# Patient Record
Sex: Female | Born: 1955 | Race: White | Hispanic: No | Marital: Married | Smoking: Former smoker
Health system: Southern US, Community
[De-identification: ages and names within clinical notes are randomized; demographics above are authoritative.]

## PROBLEM LIST (undated history)

## (undated) DIAGNOSIS — R519 Headache, unspecified: Secondary | ICD-10-CM

## (undated) DIAGNOSIS — M1990 Unspecified osteoarthritis, unspecified site: Secondary | ICD-10-CM

## (undated) DIAGNOSIS — I4892 Unspecified atrial flutter: Secondary | ICD-10-CM

## (undated) DIAGNOSIS — J449 Chronic obstructive pulmonary disease, unspecified: Secondary | ICD-10-CM

## (undated) DIAGNOSIS — K219 Gastro-esophageal reflux disease without esophagitis: Secondary | ICD-10-CM

## (undated) DIAGNOSIS — I4891 Unspecified atrial fibrillation: Secondary | ICD-10-CM

## (undated) DIAGNOSIS — F319 Bipolar disorder, unspecified: Secondary | ICD-10-CM

## (undated) HISTORY — PX: BREAST SURGERY: SHX581

## (undated) HISTORY — DX: Bipolar disorder, unspecified: F31.9

## (undated) HISTORY — DX: Unspecified osteoarthritis, unspecified site: M19.90

## (undated) HISTORY — PX: ABDOMINAL HYSTERECTOMY: SHX81

## (undated) HISTORY — DX: Gastro-esophageal reflux disease without esophagitis: K21.9

## (undated) HISTORY — PX: TONSILLECTOMY: SHX5217

## (undated) HISTORY — PX: KNEE ARTHROSCOPY: SUR90

---

## 1998-01-24 ENCOUNTER — Ambulatory Visit (HOSPITAL_COMMUNITY): Admission: RE | Admit: 1998-01-24 | Discharge: 1998-01-24 | Payer: Self-pay | Admitting: Obstetrics and Gynecology

## 1998-02-07 ENCOUNTER — Ambulatory Visit (HOSPITAL_COMMUNITY): Admission: RE | Admit: 1998-02-07 | Discharge: 1998-02-07 | Payer: Self-pay | Admitting: Obstetrics and Gynecology

## 1999-04-07 ENCOUNTER — Encounter: Payer: Self-pay | Admitting: Internal Medicine

## 1999-04-07 ENCOUNTER — Encounter: Admission: RE | Admit: 1999-04-07 | Discharge: 1999-04-07 | Payer: Self-pay | Admitting: Internal Medicine

## 1999-11-06 ENCOUNTER — Other Ambulatory Visit: Admission: RE | Admit: 1999-11-06 | Discharge: 1999-11-06 | Payer: Self-pay | Admitting: Obstetrics and Gynecology

## 2000-06-02 ENCOUNTER — Observation Stay (HOSPITAL_COMMUNITY): Admission: EM | Admit: 2000-06-02 | Discharge: 2000-06-03 | Payer: Self-pay | Admitting: Emergency Medicine

## 2000-06-02 ENCOUNTER — Encounter (INDEPENDENT_AMBULATORY_CARE_PROVIDER_SITE_OTHER): Payer: Self-pay

## 2000-06-02 ENCOUNTER — Encounter: Payer: Self-pay | Admitting: Surgery

## 2000-06-02 ENCOUNTER — Encounter: Payer: Self-pay | Admitting: Emergency Medicine

## 2000-11-12 ENCOUNTER — Encounter: Admission: RE | Admit: 2000-11-12 | Discharge: 2000-11-12 | Payer: Self-pay | Admitting: Internal Medicine

## 2000-11-12 ENCOUNTER — Encounter: Payer: Self-pay | Admitting: Internal Medicine

## 2001-01-24 ENCOUNTER — Other Ambulatory Visit: Admission: RE | Admit: 2001-01-24 | Discharge: 2001-01-24 | Payer: Self-pay | Admitting: Obstetrics and Gynecology

## 2002-02-15 ENCOUNTER — Other Ambulatory Visit: Admission: RE | Admit: 2002-02-15 | Discharge: 2002-02-15 | Payer: Self-pay | Admitting: Obstetrics and Gynecology

## 2003-08-03 ENCOUNTER — Encounter: Admission: RE | Admit: 2003-08-03 | Discharge: 2003-08-03 | Payer: Self-pay | Admitting: *Deleted

## 2003-10-23 ENCOUNTER — Emergency Department (HOSPITAL_COMMUNITY): Admission: EM | Admit: 2003-10-23 | Discharge: 2003-10-23 | Payer: Self-pay | Admitting: *Deleted

## 2004-06-18 ENCOUNTER — Ambulatory Visit: Payer: Self-pay | Admitting: Internal Medicine

## 2004-08-28 ENCOUNTER — Other Ambulatory Visit: Admission: RE | Admit: 2004-08-28 | Discharge: 2004-08-28 | Payer: Self-pay | Admitting: Obstetrics and Gynecology

## 2005-02-13 ENCOUNTER — Ambulatory Visit: Payer: Self-pay | Admitting: Internal Medicine

## 2005-04-22 ENCOUNTER — Ambulatory Visit: Payer: Self-pay | Admitting: Internal Medicine

## 2005-10-15 ENCOUNTER — Ambulatory Visit: Payer: Self-pay | Admitting: Internal Medicine

## 2005-11-06 ENCOUNTER — Ambulatory Visit: Payer: Self-pay | Admitting: Internal Medicine

## 2006-01-12 ENCOUNTER — Ambulatory Visit (HOSPITAL_BASED_OUTPATIENT_CLINIC_OR_DEPARTMENT_OTHER): Admission: RE | Admit: 2006-01-12 | Discharge: 2006-01-12 | Payer: Self-pay | Admitting: Orthopedic Surgery

## 2006-10-29 ENCOUNTER — Ambulatory Visit: Payer: Self-pay | Admitting: Internal Medicine

## 2006-10-29 LAB — CONVERTED CEMR LAB
ALT: 17 units/L (ref 0–40)
AST: 19 units/L (ref 0–37)
Albumin: 3.7 g/dL (ref 3.5–5.2)
Alkaline Phosphatase: 69 units/L (ref 39–117)
BUN: 9 mg/dL (ref 6–23)
Bilirubin, Direct: 0.1 mg/dL (ref 0.0–0.3)
CO2: 35 meq/L — ABNORMAL HIGH (ref 19–32)
Calcium: 9 mg/dL (ref 8.4–10.5)
Chloride: 105 meq/L (ref 96–112)
Cholesterol: 238 mg/dL (ref 0–200)
Creatinine, Ser: 0.7 mg/dL (ref 0.4–1.2)
Direct LDL: 143.7 mg/dL
GFR calc Af Amer: 115 mL/min
GFR calc non Af Amer: 95 mL/min
Glucose, Bld: 76 mg/dL (ref 70–99)
HCT: 43.7 % (ref 36.0–46.0)
Hemoglobin: 14.9 g/dL (ref 12.0–15.0)
Hgb A1c MFr Bld: 5.6 % (ref 4.6–6.0)
MCHC: 34.1 g/dL (ref 30.0–36.0)
MCV: 85.7 fL (ref 78.0–100.0)
Platelets: 237 10*3/uL (ref 150–400)
Potassium: 3.8 meq/L (ref 3.5–5.1)
RBC: 5.1 M/uL (ref 3.87–5.11)
RDW: 13.6 % (ref 11.5–14.6)
Sodium: 143 meq/L (ref 135–145)
TSH: 3.87 microintl units/mL (ref 0.35–5.50)
Total Bilirubin: 0.6 mg/dL (ref 0.3–1.2)
Total Protein: 7 g/dL (ref 6.0–8.3)
Vitamin B-12: 976 pg/mL — ABNORMAL HIGH (ref 211–911)
WBC: 11.3 10*3/uL — ABNORMAL HIGH (ref 4.5–10.5)

## 2006-11-04 ENCOUNTER — Ambulatory Visit: Payer: Self-pay | Admitting: Internal Medicine

## 2006-11-12 ENCOUNTER — Encounter: Payer: Self-pay | Admitting: Internal Medicine

## 2006-11-12 ENCOUNTER — Ambulatory Visit: Payer: Self-pay | Admitting: Internal Medicine

## 2006-11-12 DIAGNOSIS — K219 Gastro-esophageal reflux disease without esophagitis: Secondary | ICD-10-CM

## 2006-11-12 DIAGNOSIS — M1990 Unspecified osteoarthritis, unspecified site: Secondary | ICD-10-CM

## 2006-11-12 HISTORY — DX: Unspecified osteoarthritis, unspecified site: M19.90

## 2006-11-12 HISTORY — DX: Gastro-esophageal reflux disease without esophagitis: K21.9

## 2006-11-12 LAB — CONVERTED CEMR LAB
Anti Nuclear Antibody(ANA): NEGATIVE
Basophils Absolute: 0 10*3/uL (ref 0.0–0.1)
Basophils Relative: 0.1 % (ref 0.0–1.0)
Eosinophils Absolute: 0.2 10*3/uL (ref 0.0–0.6)
Eosinophils Relative: 1.5 % (ref 0.0–5.0)
HCT: 40.6 % (ref 36.0–46.0)
Hemoglobin: 14.1 g/dL (ref 12.0–15.0)
Lymphocytes Relative: 20.3 % (ref 12.0–46.0)
MCHC: 34.7 g/dL (ref 30.0–36.0)
MCV: 86.8 fL (ref 78.0–100.0)
Monocytes Absolute: 0.6 10*3/uL (ref 0.2–0.7)
Monocytes Relative: 5.4 % (ref 3.0–11.0)
Neutro Abs: 7.6 10*3/uL (ref 1.4–7.7)
Neutrophils Relative %: 72.7 % (ref 43.0–77.0)
Platelets: 282 10*3/uL (ref 150–400)
RBC: 4.68 M/uL (ref 3.87–5.11)
RDW: 14 % (ref 11.5–14.6)
Total CK: 101 units/L (ref 7–177)
WBC: 10.6 10*3/uL — ABNORMAL HIGH (ref 4.5–10.5)

## 2006-11-22 ENCOUNTER — Ambulatory Visit: Payer: Self-pay | Admitting: Internal Medicine

## 2007-01-28 ENCOUNTER — Telehealth (INDEPENDENT_AMBULATORY_CARE_PROVIDER_SITE_OTHER): Payer: Self-pay | Admitting: *Deleted

## 2007-02-28 ENCOUNTER — Ambulatory Visit: Payer: Self-pay | Admitting: Internal Medicine

## 2007-02-28 DIAGNOSIS — F319 Bipolar disorder, unspecified: Secondary | ICD-10-CM | POA: Insufficient documentation

## 2007-02-28 HISTORY — DX: Bipolar disorder, unspecified: F31.9

## 2007-03-01 LAB — CONVERTED CEMR LAB: Lithium Lvl: 0.36 meq/L — ABNORMAL LOW (ref 0.80–1.40)

## 2007-12-27 ENCOUNTER — Ambulatory Visit: Payer: Self-pay | Admitting: Internal Medicine

## 2008-01-02 ENCOUNTER — Telehealth: Payer: Self-pay | Admitting: Internal Medicine

## 2008-01-04 ENCOUNTER — Telehealth (INDEPENDENT_AMBULATORY_CARE_PROVIDER_SITE_OTHER): Payer: Self-pay

## 2008-01-17 ENCOUNTER — Telehealth: Payer: Self-pay | Admitting: Family Medicine

## 2009-07-23 ENCOUNTER — Ambulatory Visit: Payer: Self-pay | Admitting: Internal Medicine

## 2009-07-23 DIAGNOSIS — J029 Acute pharyngitis, unspecified: Secondary | ICD-10-CM | POA: Insufficient documentation

## 2009-07-23 LAB — CONVERTED CEMR LAB: Rapid Strep: POSITIVE

## 2009-10-02 ENCOUNTER — Encounter: Payer: Self-pay | Admitting: Internal Medicine

## 2010-04-03 ENCOUNTER — Ambulatory Visit: Payer: Self-pay | Admitting: Internal Medicine

## 2010-04-03 DIAGNOSIS — B353 Tinea pedis: Secondary | ICD-10-CM | POA: Insufficient documentation

## 2010-05-06 ENCOUNTER — Telehealth: Payer: Self-pay | Admitting: Internal Medicine

## 2010-07-08 NOTE — Assessment & Plan Note (Signed)
Summary: SORE THROAT (CONCERNED ABOUT STREP) // RS   Vital Signs:  Patient profile:   55 year old female Weight:      171 pounds Temp:     98.8 degrees F oral BP sitting:   124 / 78  (right arm) Cuff size:   regular  Vitals Entered By: Duard Brady LPN (July 23, 2009 2:47 PM) CC: c/o sore throat ,body aches   CC:  c/o sore throat  and body aches.  History of Present Illness: 55 year old patient has developed onset of sore throat, fever, and myalgias.  She has been followed by psychiatry for her bipolar disorder and her medications are being adjusted.  No documented fever.  No strep exposure.  She is requesting a refill on her medications.  Preventive Screening-Counseling & Management  Alcohol-Tobacco     Smoking Status: current  Allergies (verified): 1)  ! Doxycycline Hyclate (Doxycycline Hyclate)  Past History:  Past Medical History: GERD DJD bipolar depression  Review of Systems       The patient complains of anorexia, fever, muscle weakness, and depression.    Physical Exam  General:  Well-developed,well-nourished,in no acute distress; alert,appropriate and cooperative throughout examination Head:  Normocephalic and atraumatic without obvious abnormalities. No apparent alopecia or balding. Eyes:  No corneal or conjunctival inflammation noted. EOMI. Perrla. Funduscopic exam benign, without hemorrhages, exudates or papilledema. Vision grossly normal. Ears:  External ear exam shows no significant lesions or deformities.  Otoscopic examination reveals clear canals, tympanic membranes are intact bilaterally without bulging, retraction, inflammation or discharge. Hearing is grossly normal bilaterally. Nose:  External nasal examination shows no deformity or inflammation. Nasal mucosa are pink and moist without lesions or exudates. Mouth:  pharyngeal erythema.   Neck:  No deformities, masses, or tenderness noted. Lungs:  Normal respiratory effort, chest expands  symmetrically. Lungs are clear to auscultation, no crackles or wheezes. Heart:  Normal rate and regular rhythm. S1 and S2 normal without gallop, murmur, click, rub or other extra sounds.   Impression & Recommendations:  Problem # 1:  SORE THROAT (ICD-462)  The following medications were removed from the medication list:    Meloxicam 7.5 Mg Tabs (Meloxicam)    Azithromycin 250 Mg Tabs (Azithromycin) .Marland Kitchen... As directed Her updated medication list for this problem includes:    Penicillin V Potassium 500 Mg Tabs (Penicillin v potassium) ..... One tablet 4 times daily  Orders: Rapid Strep ([address])  Problem # 2:  DISORDER, BIPOLAR NOS (ICD-296.80)  Problem # 3:  DEGENERATIVE JOINT DISEASE (ICD-715.90)  The following medications were removed from the medication list:    Vicodin 5-500 Mg Tabs (Hydrocodone-acetaminophen)    Meloxicam 7.5 Mg Tabs (Meloxicam) Her updated medication list for this problem includes:    Hydrocodone-acetaminophen 5-500 Mg Tabs (Hydrocodone-acetaminophen) ..... One every 6 hours as needed for pain  Complete Medication List: 1)  Protonix 40 Mg Tbec (Pantoprazole sodium) .Marland Kitchen.. 1 once daily 2)  Trazodone Hcl 150 Mg Tabs (Trazodone hcl) .... 1/2 at bedtime 3)  Penicillin V Potassium 500 Mg Tabs (Penicillin v potassium) .... One tablet 4 times daily 4)  Carisoprodol 350 Mg Tabs (Carisoprodol) .... One twice daily as needed 5)  Hydrocodone-acetaminophen 5-500 Mg Tabs (Hydrocodone-acetaminophen) .... One every 6 hours as needed for pain  Patient Instructions: 1)  Take 400-600mg  of Ibuprofen (Advil, Motrin) with food every 4-6 hours as needed for relief of pain or comfort of fever. 2)  Take your antibiotic as prescribed until ALL of it is  gone, but stop if you develop a rash or swelling and contact our office as soon as possible. Prescriptions: HYDROCODONE-ACETAMINOPHEN 5-500 MG TABS (HYDROCODONE-ACETAMINOPHEN) one every 6 hours as needed for pain  #50 x 2   Entered and  Authorized by:   Gordy Savers  MD   Signed by:   Gordy Savers  MD on 07/23/2009   Method used:   Print then Give to Patient   RxID:   [id number] CARISOPRODOL 350 MG TABS (CARISOPRODOL) one twice daily as needed  #50 x 2   Entered and Authorized by:   Gordy Savers  MD   Signed by:   Gordy Savers  MD on 07/23/2009   Method used:   Print then Give to Patient   RxID:   [id number] PENICILLIN V POTASSIUM 500 MG TABS (PENICILLIN V POTASSIUM) one tablet 4 times daily  #30 x 0   Entered and Authorized by:   Gordy Savers  MD   Signed by:   Gordy Savers  MD on 07/23/2009   Method used:   Print then Give to Patient   RxID:   [id number] PROTONIX 40 MG TBEC (PANTOPRAZOLE SODIUM) 1 once daily  #60 Each x 4   Entered and Authorized by:   Gordy Savers  MD   Signed by:   Gordy Savers  MD on 07/23/2009   Method used:   Print then Give to Patient   RxID:   [id number]   Laboratory Results  Date/Time Received: July 23, 2009 3:13 PM  Date/Time Reported: July 23, 2009 3:13 PM   Other Tests  Rapid Strep: positive  Kit Test Internal QC: Negative   (Normal Range: Negative)

## 2010-07-08 NOTE — Progress Notes (Signed)
Summary: wants to talk to [facility]   Phone Note Call from Patient Call back at [phone]   Caller: pt vm triage Call For: K  Summary of Call: wants to talk to [facility]  Initial call taken by: Roselle Locus,  January 04, 2008 10:14 AM  Follow-up for Phone Call        Phone Call Completed Follow-up by: Raechel Ache, RN,  January 04, 2008 11:19 AM

## 2010-07-08 NOTE — Progress Notes (Signed)
Summary: Maxifed DMX  Phone Note Call from Patient   Caller: Patient Call For: Dr. Kirtland Bouchard Summary of Call: Pt. states Dr. Kirtland Bouchard gave her Maxifed DMX samples, and is working great.  Would like a prescription called to [facility]) [phone] Initial call taken by: Lynann Beaver CMA,  January 17, 2008 11:02 AM  Follow-up for Phone Call        ok, but there are over-the-counter medications that work just as well and as are cheaper .  Please inform her of this and let her choose which one she wants.  If she wants a prescription medication  dispense 100 tablets, one a day, 3 refills Follow-up by: Roderick Pee MD,  January 17, 2008 11:52 AM    New/Updated Medications: MAXIFED DMX 60-20-400 MG  TABS (PSEUDOEPHEDRINE-DM-GG) one by mouth daily   Prescriptions: MAXIFED DMX 60-20-400 MG  TABS (PSEUDOEPHEDRINE-DM-GG) one by mouth daily  #100 x 3   Entered by:   Lynann Beaver CMA   Authorized by:   Roderick Pee MD   Signed by:   Lynann Beaver CMA on 01/17/2008   Method used:   Electronically sent to ...       [facility]       [address]       [place], [place]  [address]       Ph: [phone]       Fax: [phone]   RxID:   [id number]    Appended Document: Maxifed DMX Pt would prefer samples, but would be interested in what you think would work as well that is less expensive. She wants Dr. Kirtland Bouchard to know that it is mold in her house that is causing her  problems. Please call her back. [phone]

## 2010-07-08 NOTE — Assessment & Plan Note (Signed)
Summary: fever, sore throat, body aches ears hurt/mhf   Vital Signs:  Patient Profile:   55 Years Old Female Temp:     97.3 degrees F oral BP sitting:   124 / 88  (left arm) Cuff size:   regular  Vitals Entered By: Raechel Ache, RN (December 27, 2007 11:15 AM)                 Chief Complaint:  C/o shakes, weak, productive cough, sore throat, and headache for weeks. Needs Rx for Atenolol..  History of Present Illness: a 55 year old patient several-day history of chest congestion, cough, intermittent fever.  There is some associated sore throat, and fatigue.  Today, is her first day of work that she has missed        Physical Exam  General:     Well-developed,well-nourished,in no acute distress; alert,appropriate and cooperative throughout examination Head:     Normocephalic and atraumatic without obvious abnormalities. No apparent alopecia or balding. Eyes:     No corneal or conjunctival inflammation noted. EOMI. Perrla. Funduscopic exam benign, without hemorrhages, exudates or papilledema. Vision grossly normal. Ears:     External ear exam shows no significant lesions or deformities.  Otoscopic examination reveals clear canals, tympanic membranes are intact bilaterally without bulging, retraction, inflammation or discharge. Hearing is grossly normal bilaterally. Mouth:     oropharynx slightly injected Neck:     No deformities, masses, or tenderness noted. Lungs:     Normal respiratory effort, chest expands symmetrically. Lungs are clear to auscultation, no crackles or wheezes.    Impression & Recommendations:  Problem # 1:  BRONCHITIS-ACUTE (ICD-466.0)  Her updated medication list for this problem includes:    Azithromycin 250 Mg Tabs (Azithromycin) .Marland Kitchen... As directed   Problem # 2:  GERD (ICD-530.81)  Her updated medication list for this problem includes:    Protonix 40 Mg Tbec (Pantoprazole sodium) .Marland Kitchen... 1 once daily   Complete Medication List: 1)   Protonix 40 Mg Tbec (Pantoprazole sodium) .Marland Kitchen.. 1 once daily 2)  Trazodone Hcl 150 Mg Tabs (Trazodone hcl) .... 1/2 at bedtime 3)  Vicodin 5-500 Mg Tabs (Hydrocodone-acetaminophen) 4)  Lithium  .... 2 at bedtime 5)  Atenolol 25 Mg Tabs (Atenolol) .Marland Kitchen.. 1 once daily as needed 6)  Meloxicam 7.5 Mg Tabs (Meloxicam) 7)  Azithromycin 250 Mg Tabs (Azithromycin) .... As directed   Patient Instructions: 1)  Please schedule a follow-up appointment as needed.   Prescriptions: AZITHROMYCIN 250 MG  TABS (AZITHROMYCIN) as directed  #5 tabs x 0   Entered and Authorized by:   Gordy Savers  MD   Signed by:   Gordy Savers  MD on 12/27/2007   Method used:   Print then Give to Patient   RxID:   [id number] MELOXICAM 7.5 MG  TABS (MELOXICAM)   #90 x 2   Entered and Authorized by:   Gordy Savers  MD   Signed by:   Gordy Savers  MD on 12/27/2007   Method used:   Print then Give to Patient   RxID:   [id number] ATENOLOL 25 MG  TABS (ATENOLOL) 1 once daily as needed  #90 x 4   Entered and Authorized by:   Gordy Savers  MD   Signed by:   Gordy Savers  MD on 12/27/2007   Method used:   Print then Give to Patient   RxID:   [id number] VICODIN 5-500 MG TABS (HYDROCODONE-ACETAMINOPHEN)   #  50 x 3   Entered and Authorized by:   Gordy Savers  MD   Signed by:   Gordy Savers  MD on 12/27/2007   Method used:   Print then Give to Patient   RxID:   [id number] TRAZODONE HCL 150 MG TABS (TRAZODONE HCL) 1/2 at bedtime  #100 Tablet x 1   Entered and Authorized by:   Gordy Savers  MD   Signed by:   Gordy Savers  MD on 12/27/2007   Method used:   Print then Give to Patient   RxID:   [id number] PROTONIX 40 MG TBEC (PANTOPRAZOLE SODIUM) 1 once daily  #60 Each x 5   Entered and Authorized by:   Gordy Savers  MD   Signed by:   Gordy Savers  MD on 12/27/2007   Method used:   Print then Give to Patient   RxID:    [id number]  ]

## 2010-07-08 NOTE — Progress Notes (Signed)
  Medications Added PROTONIX 40 MG TBEC (PANTOPRAZOLE SODIUM)  TRAZODONE HCL 150 MG TABS (TRAZODONE HCL)  VICODIN 5-500 MG TABS (HYDROCODONE-ACETAMINOPHEN)        Phone Note Call from Patient   Summary of Call: C/O RT EYELID SWELLING AND RED.NO DRAINAGE.  uSED ERYTHROMYCIN EYE OINMENT AND IT DIDNT HELP.PLEASE ADVISE.  DRUG STORE PHONE [phone]    Follow-up for Phone Call        neosporin ophth soln 5 cc 2 gtts qid  Additional Follow-up for Phone Call Additional follow up Details #1::        drug store called and pt informed    New/Updated Medications: PROTONIX 40 MG TBEC (PANTOPRAZOLE SODIUM)  TRAZODONE HCL 150 MG TABS (TRAZODONE HCL)  VICODIN 5-500 MG TABS (HYDROCODONE-ACETAMINOPHEN)

## 2010-07-08 NOTE — Progress Notes (Signed)
Summary: refill  Phone Note Call from Patient Call back at [phone]   Caller: pt live Call For: K  Summary of Call: refill on z pack still feels lousy.  [address]  Initial call taken by: Roselle Locus,  January 02, 2008 1:59 PM  Follow-up for Phone Call        not appropriate Follow-up by: Gordy Savers  MD,  January 02, 2008 4:37 PM        Appended Document: refill LMTCB

## 2010-07-08 NOTE — Letter (Signed)
Summary: [facility]  [facility]   Imported By: Maryln Gottron 10/07/2009 10:58:14  _____________________________________________________________________  External Attachment:    Type:   Image     Comment:   External Document

## 2010-07-08 NOTE — Assessment & Plan Note (Signed)
   Vital Signs:  Patient Profile:   55 Years Old Female Weight:      169 pounds               History of Present Illness: 55 y/o 2 1/2 mon h/o fatigue and anorexia.  Has tried exercise but no energy.  Out of work but no stree or depression.  Pain syn stable .  No wt loss        Physical Exam  General:     Well-developed,well-nourished,in no acute distress; alert,appropriate and cooperative throughout examination Eyes:     No corneal or conjunctival inflammation noted. EOMI. Perrla. Funduscopic exam benign, without hemorrhages, exudates or papilledema. Vision grossly normal. Mouth:     Oral mucosa and oropharynx without lesions or exudates.  Teeth in good repair. Neck:     No deformities, masses, or tenderness noted. Lungs:     Normal respiratory effort, chest expands symmetrically. Lungs are clear to auscultation, no crackles or wheezes. Heart:     Normal rate and regular rhythm. S1 and S2 normal without gallop, murmur, click, rub or other extra sounds. Abdomen:     Bowel sounds positive,abdomen soft and non-tender without masses, organomegaly or hernias noted. Neurologic:     No cranial nerve deficits noted. Station and gait are normal. Plantar reflexes are down-going bilaterally. DTRs are symmetrical throughout. Sensory, motor and coordinative functions appear intact. reflexes brisk No motot weakness able to squat without difficulty    Impression & Recommendations:  Problem # 1:  fatique, DJD, chr Pain ESR ANA CBC with diff CPK Rov 2 wks   Patient Instructions: 1)  call if worsens

## 2010-07-08 NOTE — Assessment & Plan Note (Signed)
Summary: foot issues//ccm   Vital Signs:  Patient profile:   55 year old female Weight:      163 pounds Temp:     98.5 degrees F oral BP sitting:   134 / 82  (left arm) Cuff size:   regular  Vitals Entered By: Alfred Levins, CMA (April 03, 2010 1:09 PM) CC: feet itching   CC:  feet itching.  History of Present Illness:  55 year old patient who presents with a several week history of itchy feet.  There's been some scaly dermatitis as well which has improved with the Lotrimin over the past two weeks.  She has a history of gastroesophageal reflux disease and is requesting a refill on her protonix.  She has osteoarthritis.  Allergies: 1)  ! Doxycycline Hyclate (Doxycycline Hyclate)  Past History:  Past Medical History: Reviewed history from 07/23/2009 and no changes required. GERD DJD bipolar depression  Physical Exam  General:  Well-developed,well-nourished,in no acute distress; alert,appropriate and cooperative throughout examination Head:  Normocephalic and atraumatic without obvious abnormalities. No apparent alopecia or balding. Mouth:  Oral mucosa and oropharynx without lesions or exudates.  Teeth in good repair. Neck:  No deformities, masses, or tenderness noted. Lungs:  Normal respiratory effort, chest expands symmetrically. Lungs are clear to auscultation, no crackles or wheezes. Heart:  Normal rate and regular rhythm. S1 and S2 normal without gallop, murmur, click, rub or other extra sounds. Skin:  a few scattered toenails were slightly thickened and discolored she has some scaly dry skin on the soles of the feet and especially the lateral aspects of the heel   Impression & Recommendations:  Problem # 1:  TINEA PEDIS (ICD-110.4)  Her updated medication list for this problem includes:    Clotrimazole-betamethasone 1-0.05 % Crea (Clotrimazole-betamethasone) ..... Used twice daily  Her updated medication list for this problem includes:  Clotrimazole-betamethasone 1-0.05 % Crea (Clotrimazole-betamethasone) ..... Used twice daily  Problem # 2:  GERD (ICD-530.81)  Her updated medication list for this problem includes:    Protonix 40 Mg Tbec (Pantoprazole sodium) .Marland Kitchen... 1 once daily  Her updated medication list for this problem includes:    Protonix 40 Mg Tbec (Pantoprazole sodium) .Marland Kitchen... 1 once daily  Complete Medication List: 1)  Protonix 40 Mg Tbec (Pantoprazole sodium) .Marland Kitchen.. 1 once daily 2)  Trazodone Hcl 150 Mg Tabs (Trazodone hcl) .... 1/2 at bedtime 3)  Penicillin V Potassium 500 Mg Tabs (Penicillin v potassium) .... One tablet 4 times daily 4)  Carisoprodol 350 Mg Tabs (Carisoprodol) .... One twice daily as needed 5)  Hydrocodone-acetaminophen 5-500 Mg Tabs (Hydrocodone-acetaminophen) .... One every 6 hours as needed for pain 6)  Clotrimazole-betamethasone 1-0.05 % Crea (Clotrimazole-betamethasone) .... Used twice daily 7)  Betamethasone Dipropionate Aug 0.05 % Crea (Betamethasone dipropionate aug) .... Used twice daily  Patient Instructions: 1)  Please schedule a follow-up appointment as needed. 2)  Avoid foods high in acid (tomatoes, citrus juices, spicy foods). Avoid eating within two hours of lying down or before exercising. Do not over eat; try smaller more frequent meals. Elevate head of bed twelve inches when sleeping. Prescriptions: BETAMETHASONE DIPROPIONATE AUG 0.05 % CREA (BETAMETHASONE DIPROPIONATE AUG) used twice daily  #60 gm x 0   Entered and Authorized by:   Gordy Savers  MD   Signed by:   Gordy Savers  MD on 04/03/2010   Method used:   Print then Give to Patient   RxID:   [id number] CLOTRIMAZOLE-BETAMETHASONE 1-0.05 % CREA (CLOTRIMAZOLE-BETAMETHASONE) used twice daily  #  120 gm x 2   Entered and Authorized by:   Gordy Savers  MD   Signed by:   Gordy Savers  MD on 04/03/2010   Method used:   Print then Give to Patient   RxID:   [id number] PROTONIX 40 MG TBEC  (PANTOPRAZOLE SODIUM) 1 once daily  #60 Each x 4   Entered and Authorized by:   Gordy Savers  MD   Signed by:   Gordy Savers  MD on 04/03/2010   Method used:   Print then Give to Patient   RxID:   [id number]    Orders Added: 1)  Est. Patient Level III [70350]

## 2010-07-08 NOTE — Letter (Signed)
Summary: Out of Work  Adult nurse at [employer]  [address]   [place], [place] [address]   Phone: [phone]  Fax: [phone]    December 27, 2007   Employee:  [name]    To Whom It May Concern:   For Medical reasons, please excuse the above named employee from work for the following dates:  Start:   7-21  End:   12-28-07  If you need additional information, please feel free to contact our office.         Sincerely,    Gordy Savers  MD

## 2010-07-08 NOTE — Assessment & Plan Note (Signed)
Summary: PINCHED NERVE/CCM   Vital Signs:  Patient Profile:   55 Years Old Female Weight:      168 pounds Temp:     98.3 degrees F BP sitting:   130 / 80  (left arm)  Vitals Entered By: Raechel Ache, RN (February 28, 2007 3:51 PM)                 Chief Complaint:  C/o neck pain and L heel pain. Needs Lithium level done. Needs Rx for Atenolol..  History of Present Illness: 55 year old female, history of DJD a son in town with blankets over the weekend.  Most of the day.  She was quickly turning to her right looking behind her while washing her grandkids and participate in her activities.  Throughout the night last night she developed left lateral and posterior neck pain that is aggravated specimen turning to the right        Physical Exam  General:     Well-developed,well-nourished,in no acute distress; alert,appropriate and cooperative throughout examination Msk:     there is tenderness along the left posterior lateral neck.  Range of motion was full, although somewhat painful with lateral turn to the left and right    Impression & Recommendations:  Problem # 1:  DEGENERATIVE JOINT DISEASE (ICD-715.90)  Her updated medication list for this problem includes:    Vicodin 5-500 Mg Tabs (Hydrocodone-acetaminophen)    Meloxicam 7.5 Mg Tabs (Meloxicam)   Problem # 2:  DISORDER, BIPOLAR NOS (ICD-296.80)  Orders: Venipuncture (40981) T- * Misc. Laboratory test [id number])   Complete Medication List: 1)  Protonix 40 Mg Tbec (Pantoprazole sodium) .Marland Kitchen.. 1 once daily 2)  Trazodone Hcl 150 Mg Tabs (Trazodone hcl) .... 1/2 at bedtime 3)  Vicodin 5-500 Mg Tabs (Hydrocodone-acetaminophen) 4)  Lithium  .... 2 at bedtime 5)  Atenolol 25 Mg Tabs (Atenolol) .Marland Kitchen.. 1 once daily as needed 6)  Meloxicam 7.5 Mg Tabs (Meloxicam)   Patient Instructions: 1)  Please schedule a follow-up appointment in 4 months. 2)  Limit your Sodium (Salt).    Prescriptions: MELOXICAM 7.5 MG   TABS (MELOXICAM)   #90 x 2   Entered and Authorized by:   Gordy Savers  MD   Signed by:   Gordy Savers  MD on 02/28/2007   Method used:   Print then Give to Patient   RxID:   [id number] ATENOLOL 25 MG  TABS (ATENOLOL) 1 once daily as needed  #90 x 4   Entered and Authorized by:   Gordy Savers  MD   Signed by:   Gordy Savers  MD on 02/28/2007   Method used:   Print then Give to Patient   RxID:   [id number]  ]

## 2010-07-08 NOTE — Progress Notes (Signed)
  Phone Note Call from Patient Call back at Home Phone [phone]   Caller: Patient Call For: Amanda Savers  MD Summary of Call: [phone] Forgot to ask Dr. Kirtland Bouchard for Lyrica .......severe nerve issues with hands???  Given samles about 6 months ago, and it really helped. Initial call taken by: Lynann Beaver CMA AAMA,  May 06, 2010 8:51 AM  Follow-up for Phone Call        this should be prescribed by her psychiatrist Follow-up by: Amanda Savers  MD,  May 06, 2010 9:15 AM  Additional Follow-up for Phone Call Additional follow up Details #1::        Notified pt. Additional Follow-up by: Lynann Beaver CMA AAMA,  May 06, 2010 9:31 AM

## 2010-09-03 ENCOUNTER — Encounter: Payer: Self-pay | Admitting: Internal Medicine

## 2010-09-04 ENCOUNTER — Ambulatory Visit (INDEPENDENT_AMBULATORY_CARE_PROVIDER_SITE_OTHER): Payer: PRIVATE HEALTH INSURANCE | Admitting: Internal Medicine

## 2010-09-04 ENCOUNTER — Ambulatory Visit: Payer: Self-pay | Admitting: Internal Medicine

## 2010-09-04 ENCOUNTER — Encounter: Payer: Self-pay | Admitting: Internal Medicine

## 2010-09-04 VITALS — BP 126/80 | Temp 98.0°F | Wt 163.0 lb

## 2010-09-04 DIAGNOSIS — M79609 Pain in unspecified limb: Secondary | ICD-10-CM

## 2010-09-04 DIAGNOSIS — F319 Bipolar disorder, unspecified: Secondary | ICD-10-CM

## 2010-09-04 DIAGNOSIS — M1990 Unspecified osteoarthritis, unspecified site: Secondary | ICD-10-CM

## 2010-09-04 DIAGNOSIS — M79603 Pain in arm, unspecified: Secondary | ICD-10-CM

## 2010-09-04 MED ORDER — HYDROCODONE-ACETAMINOPHEN 5-500 MG PO TABS: 1.0000 | ORAL_TABLET | Freq: Four times a day (QID) | ORAL | Status: DC | PRN

## 2010-09-04 NOTE — Patient Instructions (Signed)
Nerve conduction studies as discussed  Call or return to clinic prn if these symptoms worsen or fail to improve as anticipated.   Take Aleve 200 mg twice daily for pain or swelling  Call or return to clinic prn if these symptoms worsen or fail to improve as anticipated.

## 2010-09-04 NOTE — Progress Notes (Signed)
  Subjective:    Patient ID: Amanda [name], female    DOB: 06-Feb-1956, 55 y.o.   MRN: [id number]  HPI  55 year old patient who has a history of bipolar disorder and also a history of osteoarthritis. She presents today with a chief complaint of painful hands that have been aggressive over the past 3 or 4 years. She has been using Motrin with little benefit. She had 2 office visits last year without mention of hand or arm pain. She has had carpal tunnel in the past and has had a left-sided carpal toe release. She feels this pain is unlike her carpal tunnel pain she describes a decrease in grip strength but her chief complaint is pain she also describes some numbness of the hands and arm she states the pain is constant without alleviating or precipitating factors. She describes the pain as unbearable. A few days ago she had a severe pain involving her aspect of her left mid and lower arm;  this lasted approximately 3 minutes and was quite severe and she states that she has had persistent residual discomfort    Review of Systems  HENT: Negative for hearing loss, congestion, sore throat, rhinorrhea, dental problem, sinus pressure and tinnitus.   Eyes: Negative for pain, discharge and visual disturbance.  Respiratory: Negative for cough and shortness of breath.   Cardiovascular: Negative for chest pain, palpitations and leg swelling.  Gastrointestinal: Negative for nausea, vomiting, abdominal pain, diarrhea, constipation, blood in stool and abdominal distention.  Genitourinary: Negative for dysuria, urgency, frequency, hematuria, flank pain, vaginal bleeding, vaginal discharge, difficulty urinating, vaginal pain and pelvic pain.  Musculoskeletal: Negative for joint swelling, arthralgias and gait problem.  Skin: Negative for rash.  Neurological: Positive for weakness and numbness. Negative for dizziness, syncope, speech difficulty and headaches.  Hematological: Negative for adenopathy.    Psychiatric/Behavioral: Negative for behavioral problems, dysphoric mood and agitation. The patient is not nervous/anxious.        Objective:   Physical Exam  Musculoskeletal: Normal range of motion. She exhibits no edema and no tenderness.       Examination has revealed some mild osteoarthritic changes involving the small joints of the hands Tinel's was closely positive bilaterally it did tend to cause arm pain and some tingling of the fingertips. Grip strength appeared to be normal finger-nose testing was normal biceps and triceps reflexes were brisk and equal bilaterally. Range of motion of the head and neck did not tend to aggravate discomfort          Assessment & Plan:  Bilateral arm pain. We'll proceed with upper extremity nerve conduction studies. She did receive a refill of her Vicodin. Depending on results of above will consider referral or possibly additional studies

## 2010-10-24 NOTE — Op Note (Signed)
Amanda [name], Amanda [name]            ACCOUNT NO.:  [id number]   MEDICAL RECORD NO.:  [id number]          PATIENT TYPE:  AMB   LOCATION:  [facility]                          FACILITY:  [facility]   PHYSICIAN:  Katy Fitch. Sypher, M.D. DATE OF BIRTH:  [birth year]   DATE OF PROCEDURE:  01/12/2006  DATE OF DISCHARGE:                                 OPERATIVE REPORT   PREOPERATIVE DIAGNOSIS:  Chronic entrapment neuropathy, left median nerve at  carpal tunnel.   POSTOPERATIVE DIAGNOSIS:  Chronic entrapment neuropathy, left median nerve  at carpal tunnel.   OPERATION:  Release of left transverse carpal ligament.   OPERATIONS:  Katy Fitch. Sypher, M.D.   ASSISTANT:  Molly Maduro Dasnoit PA-C.   ANESTHESIA:  General by LMA, supervising anesthesiologist is Dr. Gelene Mink.   INDICATIONS:  Amanda [name] is a 55 year old woman referred by Dr. Syliva Overman evaluation and management of bilateral hand pain and numbness.  Clinical examination suggested carpal tunnel syndrome.  Electrodiagnostic  studies confirmed median neuropathy.  Due to a failure to respond to  nonoperative measures, she is brought to the operating room at this time for  release of her left transverse carpal ligament.   PROCEDURE:  Amanda [name] was brought to the operating room and placed in  supine position on the table.  Following induction of general anesthesia by  LMA technique, the left arm was prepped with Betadine soap and solution and  sterilely draped.  A pneumatic tourniquet was applied to the proximal  brachium.  Following exsanguination of the left arm with an Esmarch bandage,  an arterial tourniquet on the proximal brachium was inflated to 220 mmHg.   The procedure commenced with a short incision in the line of the ring finger  at the palm.  The subcutaneous tissues were carefully divided revealing the  palmar fascia.  This was split longitudinally to reveal the common sensory  branch of the median nerve.  These were  gently  isolated from the transverse  carpal ligament and the median nerve proper identified.  The ligament was  then released along its ulnar border extending into the distal forearm.  This widely opened the carpal canal.  No mass or other predicaments were  noted.  Bleeding points along the margin of  the released ligament were cauterized with bipolar current, followed by  repair of the skin with intradermal 3-0 Prolene suture.  A compressive  dressing was applied with a volar plaster splint maintaining the wrist in 5  degrees of dorsiflexion.      Katy Fitch Sypher, M.D.  Electronically Signed     RVS/MEDQ  D:  01/12/2006  T:  01/12/2006  Job:  [id number]   cc:   Lemmie Evens, M.D.  Fax: [phone]

## 2010-10-24 NOTE — H&P (Signed)
[facility]  Patient:    Amanda [name], Amanda [name]                     MRN: [id number] Adm. Date:  [id number] Attending:  Charlton Haws                         History and Physical  CHIEF COMPLAINT:  Abdominal pain.  HISTORY OF PRESENT ILLNESS:  This patient is a 55 year old lady who has presented to the emergency room with abdominal pain of approximately 2 days duration. It is primarily epigastric, right upper quadrant, and has been associated with a lot of nausea and vomiting. No fever, chills, or diarrhea. The pain has progressively gotten somewhat worse.  The patient has never had any episodes exactly similar to this but has had multiple episodes of epigastric, right upper quadrant pain that last an hour or so, usually quite mild, and she thinks have been going on really just about a year but getting more frequent.  PAST SURGICAL HISTORY:  The patient has had hysterectomy and knee surgery.  CURRENT MEDICATIONS:  Takes occasional Xanax.  ALLERGIES:  None known.  SOCIAL HISTORY/HABITS:  Smokes about a pack per day. Alcohol:  Occasional.  FAMILY HISTORY:  The patients mother is an old patient of mine that had gallbladder disease.  REVIEW OF SYSTEMS:  HEENT:  Negative. CHEST:  No cough, shortness of breath. HEART:  No history of hypertension or other GI problems. ABDOMEN:  Negative. GI/GU:  Negative. EXTREMITIES:  Negative.  PHYSICAL EXAMINATION:  GENERAL:  The patient is a 55 year old, slightly overweight female who appears to be uncomfortable, not in acute distress.  VITAL SIGNS:  Unremarkable in the emergency department.  HEENT:  Head is normocephalic. Eyes:  Nonicteric. Pupils are round and regular.  NECK:  Supple. No masses or thyromegaly.  LUNGS:  Clear to auscultation.  HEART:  Regular. No murmurs, rubs, or gallops.  ABDOMEN:  Soft and tender in the right upper quadrant with some guarding and pressing also causes nausea. Bowel  sounds are present.  PELVIC/RECTAL:  Not done.  EXTREMITIES:  No cyanosis or edema. Full peripheral pulses. No significant varicosities.  LABORATORY DATA:  Today, show a white count of 11,000. Slight elevation in liver functions but a normal bilirubin.  A gallbladder that shows multiple gallstones but no pericholecystic fluid.  IMPRESSION:  Probable acute cholecystitis.  PLAN:  Laparoscopic cholecystectomy with a possibility that this would need to be done open; and hopefully, we will able to technically do a cholangiogram. I have discussed this with the patient and mother. They understand and agree and consent to going ahead with surgery. All questions have been answered.DD: 06/02/00 TD:  06/02/00 Job: [id number]

## 2011-05-27 ENCOUNTER — Other Ambulatory Visit: Payer: Self-pay | Admitting: Internal Medicine

## 2011-08-04 ENCOUNTER — Telehealth: Payer: Self-pay | Admitting: *Deleted

## 2011-08-04 NOTE — Telephone Encounter (Signed)
Zofran 4 mg #20 one every 6 hours when necessary

## 2011-08-04 NOTE — Telephone Encounter (Signed)
Pt is stopping smoking and is using blue electronic cigarette right now, but is having severe nausea from nicotine withdrawal.   Is asking for Zofran, please.  The Phenergan makes her extremely sleepy.

## 2011-08-05 MED ORDER — ONDANSETRON HCL 4 MG PO TABS: 4.0000 mg | ORAL_TABLET | Freq: Four times a day (QID) | ORAL | Status: AC | PRN

## 2011-10-23 ENCOUNTER — Other Ambulatory Visit: Payer: Self-pay | Admitting: Infectious Diseases

## 2011-10-23 ENCOUNTER — Ambulatory Visit
Admission: RE | Admit: 2011-10-23 | Discharge: 2011-10-23 | Disposition: A | Discharge status: Home / Self Care | Payer: No Typology Code available for payment source | Source: Ambulatory Visit | Attending: Infectious Diseases | Admitting: Infectious Diseases

## 2011-10-23 DIAGNOSIS — Z201 Contact with and (suspected) exposure to tuberculosis: Secondary | ICD-10-CM

## 2011-12-31 ENCOUNTER — Other Ambulatory Visit: Payer: Self-pay | Admitting: Internal Medicine

## 2013-04-12 ENCOUNTER — Ambulatory Visit: Payer: Self-pay | Admitting: Podiatry

## 2014-06-17 DIAGNOSIS — J019 Acute sinusitis, unspecified: Secondary | ICD-10-CM | POA: Diagnosis not present

## 2014-06-17 DIAGNOSIS — J209 Acute bronchitis, unspecified: Secondary | ICD-10-CM | POA: Diagnosis not present

## 2014-07-03 ENCOUNTER — Encounter (HOSPITAL_COMMUNITY): Payer: Self-pay | Admitting: *Deleted

## 2014-07-03 ENCOUNTER — Emergency Department (HOSPITAL_COMMUNITY)
Admission: EM | Admit: 2014-07-03 | Discharge: 2014-07-03 | Disposition: A | Discharge status: Home / Self Care | Payer: Commercial Managed Care - HMO | Attending: Emergency Medicine | Admitting: Emergency Medicine

## 2014-07-03 ENCOUNTER — Emergency Department (HOSPITAL_COMMUNITY): Payer: Commercial Managed Care - HMO

## 2014-07-03 DIAGNOSIS — K219 Gastro-esophageal reflux disease without esophagitis: Secondary | ICD-10-CM | POA: Diagnosis not present

## 2014-07-03 DIAGNOSIS — Z79899 Other long term (current) drug therapy: Secondary | ICD-10-CM | POA: Diagnosis not present

## 2014-07-03 DIAGNOSIS — F319 Bipolar disorder, unspecified: Secondary | ICD-10-CM | POA: Insufficient documentation

## 2014-07-03 DIAGNOSIS — R079 Chest pain, unspecified: Secondary | ICD-10-CM | POA: Diagnosis not present

## 2014-07-03 DIAGNOSIS — Z8739 Personal history of other diseases of the musculoskeletal system and connective tissue: Secondary | ICD-10-CM | POA: Insufficient documentation

## 2014-07-03 DIAGNOSIS — R0789 Other chest pain: Secondary | ICD-10-CM | POA: Diagnosis not present

## 2014-07-03 DIAGNOSIS — Z720 Tobacco use: Secondary | ICD-10-CM | POA: Diagnosis not present

## 2014-07-03 LAB — CBC
HCT: 42.1 % (ref 36.0–46.0)
Hemoglobin: 13.9 g/dL (ref 12.0–15.0)
MCH: 29 pg (ref 26.0–34.0)
MCHC: 33 g/dL (ref 30.0–36.0)
MCV: 87.9 fL (ref 78.0–100.0)
Platelets: 236 10*3/uL (ref 150–400)
RBC: 4.79 MIL/uL (ref 3.87–5.11)
RDW: 13.1 % (ref 11.5–15.5)
WBC: 8.8 10*3/uL (ref 4.0–10.5)

## 2014-07-03 LAB — BASIC METABOLIC PANEL
Anion gap: 5 (ref 5–15)
BUN: 9 mg/dL (ref 6–23)
CO2: 30 mmol/L (ref 19–32)
Calcium: 9.1 mg/dL (ref 8.4–10.5)
Chloride: 103 mmol/L (ref 96–112)
Creatinine, Ser: 0.91 mg/dL (ref 0.50–1.10)
GFR calc Af Amer: 80 mL/min — ABNORMAL LOW (ref >90)
GFR calc non Af Amer: 69 mL/min — ABNORMAL LOW (ref >90)
Glucose, Bld: 109 mg/dL — ABNORMAL HIGH (ref 70–99)
Potassium: 4.2 mmol/L (ref 3.5–5.1)
Sodium: 138 mmol/L (ref 135–145)

## 2014-07-03 LAB — I-STAT TROPONIN, ED: Troponin i, poc: 0 ng/mL (ref 0.00–0.08)

## 2014-07-03 NOTE — ED Provider Notes (Signed)
CSN: [id number]     Arrival date & time 07/03/14  1515 History   First MD Initiated Contact with Patient 07/03/14 1847     Chief Complaint  Patient presents with  . Cough  . Chest Pain     (Consider location/radiation/quality/duration/timing/severity/associated sxs/prior Treatment) HPI   59 year old  Female with past history as below who is a daily smoker presents to ED for  Right-sided chest pain which is pleuritic and worse with movement and palpation. Patient states this is been ongoing for the past few days. She denies having symptoms of prior. She states her cough is chronic and unchanged currently. She denies any left-sided chest pain, shortness of breath, diaphoresis, nausea, vomiting, leg swelling, leg pain. No history of DVT or PE. Once it was gradual. No abdominal pain. Patient has not taken anything over-the-counter for this. No other complaints at this time.     Past Medical History  Diagnosis Date  . DEGENERATIVE JOINT DISEASE 11/12/2006  . DISORDER, BIPOLAR NOS 02/28/2007  . GERD 11/12/2006   Past Surgical History  Procedure Laterality Date  . Abdominal hysterectomy    . Breast surgery      reduction   History reviewed. No pertinent family history. History  Substance Use Topics  . Smoking status: Current Every Day Smoker -- 0.50 packs/day    Types: Cigarettes  . Smokeless tobacco: Never Used  . Alcohol Use: Not on file   OB History    No data available     Review of Systems  Constitutional: Negative for fever and chills.  HENT: Negative for congestion, rhinorrhea and sore throat.   Eyes: Negative for visual disturbance.  Respiratory: Negative for cough and shortness of breath.   Cardiovascular: Positive for chest pain. Negative for palpitations and leg swelling.  Gastrointestinal: Negative for nausea, vomiting, abdominal pain, diarrhea and constipation.  Genitourinary: Negative for dysuria, hematuria, vaginal bleeding and vaginal discharge.  Musculoskeletal:  Negative for back pain and neck pain.  Skin: Negative for rash.  Neurological: Negative for weakness and headaches.  All other systems reviewed and are negative.     Allergies  Doxycycline hyclate  Home Medications   Prior to Admission medications   Medication Sig Start Date End Date Taking? Authorizing Provider  ALPRAZolam Duanne Moron) 0.5 MG tablet Take 0.5 mg by mouth daily as needed.      Historical Provider, MD  augmented betamethasone dipropionate (DIPROLENE-AF) 0.05 % cream APPLY TOPICALLY TWICE DAILY 12/31/11   Marletta Lor, MD  betamethasone dipropionate (DIPROLENE) 0.05 % cream Apply topically 2 (two) times daily.      Historical Provider, MD  carisoprodol (SOMA) 350 MG tablet Take 350 mg by mouth 2 (two) times daily as needed.      Historical Provider, MD  clotrimazole-betamethasone (LOTRISONE) cream APPLY TOPICALLY TWICE DAILY 05/27/11   Marletta Lor, MD  HYDROcodone-acetaminophen (VICODIN) 5-500 MG per tablet Take 1 tablet by mouth every 6 (six) hours as needed. 09/04/10   Marletta Lor, MD  pantoprazole (PROTONIX) 40 MG tablet Take 40 mg by mouth daily.      Historical Provider, MD  traZODone (DESYREL) 150 MG tablet Take by mouth. 1/2 at bedtime     Historical Provider, MD   BP 136/65 mmHg  Pulse 57  Temp(Src) 98.3 F (36.8 C) (Oral)  Resp 29  SpO2 100% Physical Exam  Constitutional: She is oriented to person, place, and time. She appears well-developed and well-nourished. No distress.  HENT:  Head: Normocephalic and atraumatic.  Eyes: Conjunctivae are normal.  Neck: Normal range of motion.  Cardiovascular: Normal rate, regular rhythm, normal heart sounds and intact distal pulses.   No murmur heard. Pulmonary/Chest: Effort normal and breath sounds normal. No respiratory distress. She has no wheezes. She has no rales. She exhibits tenderness and bony tenderness.  Abdominal: Soft. Bowel sounds are normal. She exhibits no distension.  Musculoskeletal:  Normal range of motion.  Neurological: She is alert and oriented to person, place, and time. No cranial nerve deficit.  Skin: Skin is warm and dry.  Psychiatric: She has a normal mood and affect.  Nursing note and vitals reviewed.   ED Course  Procedures (including critical care time) Labs Review Labs Reviewed  BASIC METABOLIC PANEL - Abnormal; Notable for the following:    Glucose, Bld 109 (*)    GFR calc non Af Amer 69 (*)    GFR calc Af Amer 80 (*)    All other components within normal limits  CBC  I-STAT TROPOININ, ED    Imaging Review Dg Chest 2 View  07/03/2014   CLINICAL DATA:  Right-sided chest pain for 3 day  EXAM: CHEST  2 VIEW  COMPARISON:  10/23/2011  FINDINGS: The heart size and mediastinal contours are within normal limits. Both lungs are clear. The visualized skeletal structures are unremarkable.  IMPRESSION: No active cardiopulmonary disease.   Electronically Signed   By: Maryclare Bean M.D.   On: 07/03/2014 16:42     EKG Interpretation None      MDM   Final diagnoses:  Right-sided chest wall pain   Patient presents with right-sided chest wall pain that is easily reproducible on exam. Vital signs are within normal limits. Exam is otherwise benign.  EKG shows no sinus rhythm and no ST-T wave abnormality. Screening labs benign.  Clinical picture likely musculoskeletal/chest wall pain.  I advised patient to use ibuprofen and follow closely with her PCP. I have very low suspicion for ACS at this time. Low suspicion for PE, well 0,  No shortness of breath and hypoxia.  Strict return precautions have been discussed and she voices understanding.    Clinical Impression: 1. Right-sided chest wall pain     Disposition: Discharge  Condition: Good  I have discussed the results, Dx and Tx plan with the pt(& family if present). He/she/they expressed understanding and agree(s) with the plan. Discharge instructions discussed at great length. Strict return precautions  discussed and pt &/or family have verbalized understanding of the instructions. No further questions at time of discharge.    Discharge Medication List as of 07/03/2014  7:09 PM      Follow Up: Marletta Lor, MD [place] [id number] [phone]   If not improved in 2 days  [place] [address] [id number] [place] [id number] [phone]  If symptoms worsen   Pt seen in conjunction with Dr. Lenis Dickinson, [facility] Resident - PGY-2      Kirstie Peri, MD 07/03/14 [place], MD 07/05/14 [phone]

## 2014-07-03 NOTE — ED Notes (Signed)
Pt reports having a non productive cough. Now has constant right side chest pain, but more severe with breathing, movement, coughing. Airway intact and ekg done.

## 2014-10-25 DIAGNOSIS — H5213 Myopia, bilateral: Secondary | ICD-10-CM | POA: Diagnosis not present

## 2014-10-25 DIAGNOSIS — H5210 Myopia, unspecified eye: Secondary | ICD-10-CM | POA: Diagnosis not present

## 2014-11-02 DIAGNOSIS — F3175 Bipolar disorder, in partial remission, most recent episode depressed: Secondary | ICD-10-CM | POA: Diagnosis not present

## 2014-12-19 DIAGNOSIS — F1120 Opioid dependence, uncomplicated: Secondary | ICD-10-CM | POA: Diagnosis not present

## 2015-03-11 DIAGNOSIS — Z0000 Encounter for general adult medical examination without abnormal findings: Secondary | ICD-10-CM | POA: Diagnosis not present

## 2015-03-11 DIAGNOSIS — Z23 Encounter for immunization: Secondary | ICD-10-CM | POA: Diagnosis not present

## 2015-03-11 DIAGNOSIS — F319 Bipolar disorder, unspecified: Secondary | ICD-10-CM | POA: Diagnosis not present

## 2015-03-12 DIAGNOSIS — Z0000 Encounter for general adult medical examination without abnormal findings: Secondary | ICD-10-CM | POA: Diagnosis not present

## 2015-03-12 DIAGNOSIS — E7800 Pure hypercholesterolemia, unspecified: Secondary | ICD-10-CM | POA: Diagnosis not present

## 2015-03-12 DIAGNOSIS — K219 Gastro-esophageal reflux disease without esophagitis: Secondary | ICD-10-CM | POA: Diagnosis not present

## 2015-03-12 DIAGNOSIS — F319 Bipolar disorder, unspecified: Secondary | ICD-10-CM | POA: Diagnosis not present

## 2015-04-10 DIAGNOSIS — H6692 Otitis media, unspecified, left ear: Secondary | ICD-10-CM | POA: Diagnosis not present

## 2015-04-10 DIAGNOSIS — H6092 Unspecified otitis externa, left ear: Secondary | ICD-10-CM | POA: Diagnosis not present

## 2015-05-09 DIAGNOSIS — Z1211 Encounter for screening for malignant neoplasm of colon: Secondary | ICD-10-CM | POA: Diagnosis not present

## 2015-05-14 DIAGNOSIS — F3175 Bipolar disorder, in partial remission, most recent episode depressed: Secondary | ICD-10-CM | POA: Diagnosis not present

## 2015-06-26 DIAGNOSIS — Z1212 Encounter for screening for malignant neoplasm of rectum: Secondary | ICD-10-CM | POA: Diagnosis not present

## 2015-06-26 DIAGNOSIS — Z1211 Encounter for screening for malignant neoplasm of colon: Secondary | ICD-10-CM | POA: Diagnosis not present

## 2015-07-09 DIAGNOSIS — F3175 Bipolar disorder, in partial remission, most recent episode depressed: Secondary | ICD-10-CM | POA: Diagnosis not present

## 2015-12-19 DIAGNOSIS — H6691 Otitis media, unspecified, right ear: Secondary | ICD-10-CM | POA: Diagnosis not present

## 2015-12-19 DIAGNOSIS — J0100 Acute maxillary sinusitis, unspecified: Secondary | ICD-10-CM | POA: Diagnosis not present

## 2016-01-12 DIAGNOSIS — H6691 Otitis media, unspecified, right ear: Secondary | ICD-10-CM | POA: Diagnosis not present

## 2016-01-12 DIAGNOSIS — J019 Acute sinusitis, unspecified: Secondary | ICD-10-CM | POA: Diagnosis not present

## 2016-02-25 DIAGNOSIS — F3175 Bipolar disorder, in partial remission, most recent episode depressed: Secondary | ICD-10-CM | POA: Diagnosis not present

## 2016-04-23 DIAGNOSIS — J069 Acute upper respiratory infection, unspecified: Secondary | ICD-10-CM | POA: Diagnosis not present

## 2016-07-01 DIAGNOSIS — F1120 Opioid dependence, uncomplicated: Secondary | ICD-10-CM | POA: Diagnosis not present

## 2016-08-14 DIAGNOSIS — F3175 Bipolar disorder, in partial remission, most recent episode depressed: Secondary | ICD-10-CM | POA: Diagnosis not present

## 2016-09-21 DIAGNOSIS — J0100 Acute maxillary sinusitis, unspecified: Secondary | ICD-10-CM | POA: Diagnosis not present

## 2016-11-05 DIAGNOSIS — H66009 Acute suppurative otitis media without spontaneous rupture of ear drum, unspecified ear: Secondary | ICD-10-CM | POA: Diagnosis not present

## 2016-11-05 DIAGNOSIS — H6123 Impacted cerumen, bilateral: Secondary | ICD-10-CM | POA: Diagnosis not present

## 2016-12-25 DIAGNOSIS — Z0000 Encounter for general adult medical examination without abnormal findings: Secondary | ICD-10-CM | POA: Diagnosis not present

## 2017-03-11 DIAGNOSIS — F3175 Bipolar disorder, in partial remission, most recent episode depressed: Secondary | ICD-10-CM | POA: Diagnosis not present

## 2017-04-15 DIAGNOSIS — E7800 Pure hypercholesterolemia, unspecified: Secondary | ICD-10-CM | POA: Diagnosis not present

## 2017-04-15 DIAGNOSIS — D81818 Other biotin-dependent carboxylase deficiency: Secondary | ICD-10-CM | POA: Diagnosis not present

## 2017-04-15 DIAGNOSIS — Z23 Encounter for immunization: Secondary | ICD-10-CM | POA: Diagnosis not present

## 2017-04-15 DIAGNOSIS — Z0000 Encounter for general adult medical examination without abnormal findings: Secondary | ICD-10-CM | POA: Diagnosis not present

## 2017-05-10 DIAGNOSIS — F1129 Opioid dependence with unspecified opioid-induced disorder: Secondary | ICD-10-CM | POA: Diagnosis not present

## 2017-05-10 DIAGNOSIS — F319 Bipolar disorder, unspecified: Secondary | ICD-10-CM | POA: Diagnosis not present

## 2017-05-10 DIAGNOSIS — Z79899 Other long term (current) drug therapy: Secondary | ICD-10-CM | POA: Diagnosis not present

## 2017-05-10 DIAGNOSIS — Z5181 Encounter for therapeutic drug level monitoring: Secondary | ICD-10-CM | POA: Diagnosis not present

## 2017-05-10 DIAGNOSIS — E7800 Pure hypercholesterolemia, unspecified: Secondary | ICD-10-CM | POA: Diagnosis not present

## 2017-05-21 DIAGNOSIS — M545 Low back pain: Secondary | ICD-10-CM | POA: Diagnosis not present

## 2017-05-21 DIAGNOSIS — G8929 Other chronic pain: Secondary | ICD-10-CM | POA: Diagnosis not present

## 2017-05-21 DIAGNOSIS — F1120 Opioid dependence, uncomplicated: Secondary | ICD-10-CM | POA: Diagnosis not present

## 2017-06-22 DIAGNOSIS — F1120 Opioid dependence, uncomplicated: Secondary | ICD-10-CM | POA: Diagnosis not present

## 2017-06-22 DIAGNOSIS — Z5181 Encounter for therapeutic drug level monitoring: Secondary | ICD-10-CM | POA: Diagnosis not present

## 2017-06-23 DIAGNOSIS — F3175 Bipolar disorder, in partial remission, most recent episode depressed: Secondary | ICD-10-CM | POA: Diagnosis not present

## 2017-07-03 ENCOUNTER — Emergency Department (HOSPITAL_COMMUNITY): Payer: Medicare HMO

## 2017-07-03 ENCOUNTER — Encounter (HOSPITAL_COMMUNITY): Payer: Self-pay | Admitting: Emergency Medicine

## 2017-07-03 ENCOUNTER — Other Ambulatory Visit: Payer: Self-pay

## 2017-07-03 ENCOUNTER — Observation Stay (HOSPITAL_COMMUNITY)
Admission: EM | Admit: 2017-07-03 | Discharge: 2017-07-04 | Disposition: A | Discharge status: Home / Self Care | Payer: Medicare HMO | Attending: Cardiology | Admitting: Cardiology

## 2017-07-03 DIAGNOSIS — Z79899 Other long term (current) drug therapy: Secondary | ICD-10-CM | POA: Insufficient documentation

## 2017-07-03 DIAGNOSIS — I499 Cardiac arrhythmia, unspecified: Secondary | ICD-10-CM | POA: Diagnosis not present

## 2017-07-03 DIAGNOSIS — F17210 Nicotine dependence, cigarettes, uncomplicated: Secondary | ICD-10-CM | POA: Insufficient documentation

## 2017-07-03 DIAGNOSIS — I480 Paroxysmal atrial fibrillation: Secondary | ICD-10-CM | POA: Diagnosis not present

## 2017-07-03 DIAGNOSIS — I481 Persistent atrial fibrillation: Secondary | ICD-10-CM | POA: Diagnosis not present

## 2017-07-03 DIAGNOSIS — R0602 Shortness of breath: Secondary | ICD-10-CM | POA: Diagnosis not present

## 2017-07-03 DIAGNOSIS — I4891 Unspecified atrial fibrillation: Secondary | ICD-10-CM | POA: Diagnosis not present

## 2017-07-03 DIAGNOSIS — R002 Palpitations: Secondary | ICD-10-CM | POA: Diagnosis not present

## 2017-07-03 DIAGNOSIS — Z720 Tobacco use: Secondary | ICD-10-CM

## 2017-07-03 LAB — BASIC METABOLIC PANEL
Anion gap: 12 (ref 5–15)
BUN: 15 mg/dL (ref 6–20)
CO2: 23 mmol/L (ref 22–32)
Calcium: 8.9 mg/dL (ref 8.9–10.3)
Chloride: 103 mmol/L (ref 101–111)
Creatinine, Ser: 0.88 mg/dL (ref 0.44–1.00)
GFR calc Af Amer: >60 mL/min (ref >60)
GFR calc non Af Amer: >60 mL/min (ref >60)
Glucose, Bld: 109 mg/dL — ABNORMAL HIGH (ref 65–99)
Potassium: 4.4 mmol/L (ref 3.5–5.1)
Sodium: 138 mmol/L (ref 135–145)

## 2017-07-03 LAB — TSH: TSH: 2.286 u[IU]/mL (ref 0.350–4.500)

## 2017-07-03 LAB — I-STAT BETA HCG BLOOD, ED (MC, WL, AP ONLY): I-stat hCG, quantitative: <5 m[IU]/mL (ref <5)

## 2017-07-03 LAB — I-STAT TROPONIN, ED: Troponin i, poc: 0 ng/mL (ref 0.00–0.08)

## 2017-07-03 LAB — CBC
HCT: 50.4 % — ABNORMAL HIGH (ref 36.0–46.0)
Hemoglobin: 16.6 g/dL — ABNORMAL HIGH (ref 12.0–15.0)
MCH: 29.7 pg (ref 26.0–34.0)
MCHC: 32.9 g/dL (ref 30.0–36.0)
MCV: 90.2 fL (ref 78.0–100.0)
Platelets: 251 10*3/uL (ref 150–400)
RBC: 5.59 MIL/uL — ABNORMAL HIGH (ref 3.87–5.11)
RDW: 13.4 % (ref 11.5–15.5)
WBC: 11.7 10*3/uL — ABNORMAL HIGH (ref 4.0–10.5)

## 2017-07-03 LAB — D-DIMER, QUANTITATIVE: D-Dimer, Quant: 0.53 ug/mL-FEU — ABNORMAL HIGH (ref 0.00–0.50)

## 2017-07-03 LAB — MAGNESIUM: Magnesium: 2.1 mg/dL (ref 1.7–2.4)

## 2017-07-03 LAB — TROPONIN I: Troponin I: <0.03 ng/mL (ref <0.03)

## 2017-07-03 MED ORDER — IOPAMIDOL (ISOVUE-370) INJECTION 76%
INTRAVENOUS | Status: AC
  Filled 2017-07-03: qty 50

## 2017-07-03 MED ORDER — BUPRENORPHINE HCL-NALOXONE HCL 2-0.5 MG SL SUBL
1.0000 | SUBLINGUAL_TABLET | Freq: Every day | SUBLINGUAL | Status: DC
  Administered 2017-07-04: 1 via SUBLINGUAL
  Filled 2017-07-03: qty 1

## 2017-07-03 MED ORDER — LAMOTRIGINE 100 MG PO TABS
200.0000 mg | ORAL_TABLET | Freq: Every day | ORAL | Status: DC
Start: 2017-07-03 — End: 2017-07-04
  Administered 2017-07-03: 200 mg via ORAL
  Filled 2017-07-03: qty 2

## 2017-07-03 MED ORDER — IOPAMIDOL (ISOVUE-370) INJECTION 76%
INTRAVENOUS | Status: AC
  Administered 2017-07-03: 100 mL
  Filled 2017-07-03: qty 100

## 2017-07-03 MED ORDER — APIXABAN 5 MG PO TABS: 5.0000 mg | ORAL_TABLET | Freq: Two times a day (BID) | ORAL | Status: DC

## 2017-07-03 MED ORDER — NICOTINE 21 MG/24HR TD PT24
21.0000 mg | MEDICATED_PATCH | Freq: Every day | TRANSDERMAL | Status: DC
Start: 2017-07-03 — End: 2017-07-04
  Filled 2017-07-03: qty 1

## 2017-07-03 MED ORDER — CITALOPRAM HYDROBROMIDE 20 MG PO TABS
20.0000 mg | ORAL_TABLET | Freq: Every day | ORAL | Status: DC
Start: 2017-07-03 — End: 2017-07-04
  Administered 2017-07-03: 20 mg via ORAL
  Filled 2017-07-03: qty 1

## 2017-07-03 MED ORDER — DILTIAZEM HCL 30 MG PO TABS
30.0000 mg | ORAL_TABLET | Freq: Four times a day (QID) | ORAL | Status: DC
  Administered 2017-07-03 – 2017-07-04 (×2): 30 mg via ORAL
  Filled 2017-07-03 (×2): qty 1

## 2017-07-03 MED ORDER — RISPERIDONE 1 MG PO TABS
1.0000 mg | ORAL_TABLET | Freq: Every day | ORAL | Status: DC
Start: 2017-07-03 — End: 2017-07-04
  Administered 2017-07-03: 1 mg via ORAL
  Filled 2017-07-03: qty 1

## 2017-07-03 MED ORDER — GABAPENTIN 100 MG PO CAPS
100.0000 mg | ORAL_CAPSULE | Freq: Every day | ORAL | Status: DC
  Administered 2017-07-03: 100 mg via ORAL
  Filled 2017-07-03: qty 1

## 2017-07-03 MED ORDER — DILTIAZEM HCL 30 MG PO TABS: 30.0000 mg | ORAL_TABLET | Freq: Four times a day (QID) | ORAL | Status: DC

## 2017-07-03 MED ORDER — ONDANSETRON HCL 4 MG/2ML IJ SOLN: 4.0000 mg | Freq: Four times a day (QID) | INTRAMUSCULAR | Status: DC | PRN

## 2017-07-03 MED ORDER — ACETAMINOPHEN 325 MG PO TABS: 650.0000 mg | ORAL_TABLET | ORAL | Status: DC | PRN

## 2017-07-03 MED ORDER — APIXABAN 5 MG PO TABS
5.0000 mg | ORAL_TABLET | Freq: Two times a day (BID) | ORAL | Status: DC
  Administered 2017-07-03 – 2017-07-04 (×2): 5 mg via ORAL
  Filled 2017-07-03 (×2): qty 1

## 2017-07-03 NOTE — ED Provider Notes (Signed)
[place] EMERGENCY DEPARTMENT Provider Note   CSN: [id number] Arrival date & time: 07/03/17  1031     History   Chief Complaint Chief Complaint  Patient presents with  . Atrial Fibrillation  . Shortness of Breath    HPI Amanda [name] is a 62 y.o. female with past medical history significant for bipolar disorder presenting from clinic with new onset A. Fib.  Patient states that she started feeling like she can breathe well last night she went to sleep in a recliner woke up at 7 AM this morning and felt irregular heart rate and could not catch her breath.  And went to the clinic and an EKG was performed at [facility] which showed A. fib and she was sent over to the emergency department.  Denies any change in medication, drug use, alcohol use, history of CHF or COPD. This is a first time event for the patient.  She does report history of estrogen use currently with a patch. Denies any history of DVT/PE, cough, chest pain, prolonged immobilization, recent surgery, leg swelling or pain, history of malignancy. Medications including risperidone, Celexa, Lamictal, gabapentin.  She reports currently feeling slightly short of breath no other symptoms.   HPI  Past Medical History:  Diagnosis Date  . DEGENERATIVE JOINT DISEASE 11/12/2006  . DISORDER, BIPOLAR NOS 02/28/2007  . GERD 11/12/2006    Patient Active Problem List   Diagnosis Date Noted  . Atrial fibrillation ([place]) 07/03/2017  . Tobacco abuse 07/03/2017  . DISORDER, BIPOLAR NOS 02/28/2007  . GERD 11/12/2006  . DEGENERATIVE JOINT DISEASE 11/12/2006    Past Surgical History:  Procedure Laterality Date  . ABDOMINAL HYSTERECTOMY    . BREAST SURGERY     reduction  . KNEE ARTHROSCOPY    . TONSILLECTOMY      OB History    No data available       Home Medications    Prior to Admission medications   Medication Sig Start Date End Date Taking? Authorizing Provider  Buprenorphine HCl-Naloxone HCl  (ZUBSOLV) 1.4-0.36 MG SUBL Place 1 tablet under the tongue every morning.   Yes [provider]  citalopram (CELEXA) 20 MG tablet Take 20 mg by mouth at bedtime. 04/06/17  Yes [provider]  clotrimazole-betamethasone (LOTRISONE) cream APPLY TOPICALLY TWICE DAILY 05/27/11  Yes Marletta Lor, MD  gabapentin (NEURONTIN) 100 MG capsule Take 100 mg by mouth at bedtime. 04/03/17  Yes [provider]  lamoTRIgine (LAMICTAL) 200 MG tablet Take 200 mg by mouth at bedtime. 04/06/17  Yes [provider]  ondansetron (ZOFRAN) 4 MG tablet Take 4 mg by mouth every 6 (six) hours as needed for nausea/vomiting. 04/19/17  Yes [provider]  risperiDONE (RISPERDAL) 1 MG tablet Take 1 mg by mouth at bedtime. 04/06/17  Yes [provider]    Family History Family History  Problem Relation Age of Onset  . Dementia Mother   . Congestive Heart Failure Father     Social History Social History   Tobacco Use  . Smoking status: Current Every Day Smoker    Packs/day: 2.00    Types: Cigarettes  . Smokeless tobacco: Never Used  Substance Use Topics  . Alcohol use: No    Frequency: Never  . Drug use: Yes    Frequency: 7.0 times per week    Types: Marijuana    Comment: daily marijuana use     Allergies   Augmentin [amoxicillin-pot clavulanate] and Doxycycline hyclate  Review of Systems Review of Systems  Constitutional: Negative for chills and fever.  HENT: Negative for congestion and trouble swallowing.   Eyes: Negative for photophobia, pain, redness and visual disturbance.  Respiratory: Positive for shortness of breath. Negative for cough, choking, chest tightness, wheezing and stridor.   Cardiovascular: Positive for palpitations. Negative for chest pain and leg swelling.  Gastrointestinal: Negative for abdominal distention, abdominal pain, diarrhea, nausea and vomiting.  Genitourinary: Negative for difficulty urinating, dysuria, flank  pain and frequency.  Musculoskeletal: Negative for neck pain and neck stiffness.  Skin: Negative for color change, pallor, rash and wound.  Neurological: Negative for dizziness, tremors, seizures, syncope, facial asymmetry, speech difficulty, weakness, light-headedness, numbness and headaches.     Physical Exam Updated Vital Signs BP 115/71   Pulse 63   Temp 98.1 F (36.7 C)   Resp 16   Ht 5\' 4"  (1.626 m)   Wt 81.6 kg (180 lb)   SpO2 91%   BMI 30.90 kg/m   Physical Exam  Constitutional: She is oriented to person, place, and time. She appears well-developed and well-nourished.  Non-toxic appearance. She does not appear ill. No distress.  Afebrile, nontoxic-appearing, sitting comfortably in bed no acute distress.  Speaking in full sentences and satting 96% on room air.  HENT:  Head: Normocephalic and atraumatic.  Eyes: Conjunctivae and EOM are normal.  Neck: Normal range of motion. Neck supple.  Cardiovascular: Normal rate.  No murmur heard. Irregularly irregular  Pulmonary/Chest: Effort normal and breath sounds normal. No accessory muscle usage or stridor. No apnea, no tachypnea and no bradypnea. No respiratory distress. She has no decreased breath sounds. She exhibits no tenderness.  Abdominal: She exhibits no distension.  Musculoskeletal: Normal range of motion. She exhibits no edema.       Right lower leg: Normal. She exhibits no tenderness and no edema.       Left lower leg: Normal. She exhibits no tenderness and no edema.  Neurological: She is alert and oriented to person, place, and time. She is not disoriented.  Skin: Skin is warm and dry. No rash noted. She is not diaphoretic. No erythema. No pallor.  Psychiatric: She has a normal mood and affect.  Nursing note and vitals reviewed.    ED Treatments / Results  Labs (all labs ordered are listed, but only abnormal results are displayed) Labs Reviewed  BASIC METABOLIC PANEL - Abnormal; Notable for the following  components:      Result Value   Glucose, Bld 109 (*)    All other components within normal limits  CBC - Abnormal; Notable for the following components:   WBC 11.7 (*)    RBC 5.59 (*)    Hemoglobin 16.6 (*)    HCT 50.4 (*)    All other components within normal limits  D-DIMER, QUANTITATIVE (NOT AT [facility]) - Abnormal; Notable for the following components:   D-Dimer, Quant 0.53 (*)    All other components within normal limits  MAGNESIUM  TSH  TROPONIN I  TROPONIN I  CBC  I-STAT BETA HCG BLOOD, ED ([facility], [facility], [facility] ONLY)  I-STAT TROPONIN, ED    EKG  EKG Interpretation  Date/Time:  Saturday July 03 2017 11:23:23 EST Ventricular Rate:  92 PR Interval:    QRS Duration: 90 QT Interval:  367 QTC Calculation: 454 R Axis:   47 Text Interpretation:  Atrial fibrillation Low voltage, precordial leads Since previous tracing atrial fibrillation is new Confirmed by Alfonzo Beers [phone]) on 07/03/2017 11:42:20  AM       Radiology Dg Chest 2 View  Result Date: 07/03/2017 CLINICAL DATA:  AFib EXAM: CHEST  2 VIEW COMPARISON:  07/03/2014 FINDINGS: The lungs are clear without focal pneumonia, edema, pneumothorax or pleural effusion. The cardiopericardial silhouette is within normal limits for size. The visualized bony structures of the thorax are intact. IMPRESSION: No active cardiopulmonary disease. Electronically Signed   By: Misty Stanley M.D.   On: 07/03/2017 11:07   Ct Angio Chest Pe W And/or Wo Contrast  Result Date: 07/03/2017 CLINICAL DATA:  Irregular heartbeat, elevated D-dimer question pulmonary embolism, unable to get breath, symptoms continued to present, suspected pulmonary embolism with moderate clinical probability EXAM: CT ANGIOGRAPHY CHEST WITH CONTRAST TECHNIQUE: Multidetector CT imaging of the chest was performed using the standard protocol during bolus administration of intravenous contrast. Multiplanar CT image reconstructions and MIPs were obtained to evaluate the vascular  anatomy. CONTRAST:  121mL ISOVUE-370 IOPAMIDOL (ISOVUE-370) INJECTION 76% IV COMPARISON:  None FINDINGS: Cardiovascular: Upper normal caliber ascending thoracic aorta. No aortic aneurysm or dissection. Pulmonary arteries well opacified and patent. No evidence of pulmonary embolism. No pericardial effusion. Mediastinum/Nodes: Esophagus unremarkable. No thoracic adenopathy. Few scattered normal size mediastinal lymph nodes. Base of cervical region unremarkable. Lungs/Pleura: Dependent atelectasis in the posterior lower lobes bilaterally. Central peribronchial thickening. No pulmonary infiltrate, pleural effusion or pneumothorax. No definite pulmonary mass/nodule. Upper Abdomen: Normal appearance Musculoskeletal: No acute abnormalities. Review of the MIP images confirms the above findings. IMPRESSION: No evidence of pulmonary embolism. Bronchitic changes with dependent atelectasis in BILATERAL lower lobes. Electronically Signed   By: Lavonia Dana M.D.   On: 07/03/2017 14:14    Procedures Procedures (including critical care time)  Medications Ordered in ED Medications  iopamidol (ISOVUE-370) 76 % injection (not administered)  diltiazem (CARDIZEM) tablet 30 mg (not administered)  apixaban (ELIQUIS) tablet 5 mg (not administered)  nicotine (NICODERM CQ - dosed in mg/24 hours) patch 21 mg (not administered)  iopamidol (ISOVUE-370) 76 % injection (100 mLs  Contrast Given 07/03/17 1328)     Initial Impression / Assessment and Plan / ED Course  I have reviewed the triage vital signs and the nursing notes.  Pertinent labs & imaging results that were available during my care of the patient were reviewed by me and considered in my medical decision making (see chart for details).     Patient with no pertinent past medical history presenting with new onset A. fib with symptoms of shortness of breath starting last night before bed. Denies any Chest pain. Hemodynamically stable in ED. Chest x-ray negative, EKG  showing A. fib without RVR.  Rate in the 80s  Patient is satting 96% on room air and speaking in full sentences, no respiratory distress.  Hemodynamically stable.  Differential includes PE. Cannot use PERC due to age and estrogen use. Dimer positive  CTa PE negative  CHADS-VASc score: 1  Consulted cardiology Spoke to [place] who will be coming to see patient.  Patient was admitted by cardiology.  Patient was discussed with Dr. Marcha Dutton who agrees with assessment and plan. Final Clinical Impressions(s) / ED Diagnoses   Final diagnoses:  Atrial fibrillation, unspecified type [facility])    ED Discharge Orders        Ordered    Amb referral to AFIB Clinic     07/03/17 1117       Dossie Der 07/03/17 1835    Pixie Casino, MD 07/04/17 [phone]

## 2017-07-03 NOTE — ED Triage Notes (Signed)
Pt. Stated, MY heart started acting up yesterday and I went to my doctor this morning which showed I had A-Fib.

## 2017-07-03 NOTE — H&P (Signed)
Cardiology Admission History and Physical:   Patient ID: MAYUMI SUMMERSON; MRN: [id number]; DOB: 1956-04-28   Admission date: 07/03/2017  Primary Care Provider: Marletta Lor, MD Primary Cardiologist: New to [facility], Dr. Stanford Breed  Chief Complaint:  Irregular heart beat  Patient Profile:   MARIACLARA SPEAR is a 62 y.o. female with a history of bipolar disorder who presented to an outside clinic this AM with complaints of an irregular heart beat. She was found to be in Afib on EKG and sent to [facility] ED for further management.   History of Present Illness:   Ms. Cavendish is a 62 y.o. female with a history of bipolar disorder who presented with complaints of an irregular heart beat. States she was in her usual state of health until this morning when she woke up at 7am and felt palpitations associated with SOB. She had never experienced this before so she presented to a nearby clinic where an EKG revealed she was in Afib. She was instructed to go to the ED for further management. She had an episode of L arm pain/numbness on the evening of 07/02/17 which resolved overnight. She denies associated CP, diaphoresis, N/V, dizziness, or lightheadedness. She denies prior cardiac history including abnormal heart rhythm, HTN, CAD, valvular disorder, or CHF. Prior to these events she denies complaints of CP, SOB, DOE, PND, LE edema, palpitations, or syncope. She states she sleeps in a recliner due to back pain 2/2 an accident, but denies orthopnea.   ROS negative for bleeding disorder, hematochezia, melena, hematuria, URI symptoms, fever, recent illnesses, N/V/D.   ED Course: HR max 130s, currently 60s-70s; BP stable; O2 RA 90%, 98% on 2L; afebrile. Labs notable for mild leukocytosis, Cr 0.88, electrolytes wnl, TSH wnl, Ddimer ^0.53, trop negative x1. EKG with atrial  fibrillation rate 92, non-ischemic. CXR without acute findings. CT Chest without PE. Admitted to cardiology for further management  of new onset atrial fibrillation.   Past Medical History:  Diagnosis Date  . DEGENERATIVE JOINT DISEASE 11/12/2006  . DISORDER, BIPOLAR NOS 02/28/2007  . GERD 11/12/2006    Past Surgical History:  Procedure Laterality Date  . ABDOMINAL HYSTERECTOMY    . BREAST SURGERY     reduction     Medications Prior to Admission: Prior to Admission medications   Medication Sig Start Date End Date Taking? Authorizing Provider  ALPRAZolam Duanne Moron) 0.5 MG tablet Take 0.5 mg by mouth daily as needed.      [provider]  augmented betamethasone dipropionate (DIPROLENE-AF) 0.05 % cream APPLY TOPICALLY TWICE DAILY 12/31/11   Marletta Lor, MD  betamethasone dipropionate (DIPROLENE) 0.05 % cream Apply topically 2 (two) times daily.      [provider]  carisoprodol (SOMA) 350 MG tablet Take 350 mg by mouth 2 (two) times daily as needed.      [provider]  clotrimazole-betamethasone (LOTRISONE) cream APPLY TOPICALLY TWICE DAILY 05/27/11   Marletta Lor, MD  HYDROcodone-acetaminophen (VICODIN) 5-500 MG per tablet Take 1 tablet by mouth every 6 (six) hours as needed. 09/04/10   Marletta Lor, MD  pantoprazole (PROTONIX) 40 MG tablet Take 40 mg by mouth daily.      [provider]  traZODone (DESYREL) 150 MG tablet Take by mouth. 1/2 at bedtime     [provider]     Allergies:    Allergies  Allergen Reactions  . Augmentin [Amoxicillin-Pot Clavulanate] Other (See Comments)    Has patient had a PCN reaction  causing immediate rash, facial/tongue/throat swelling, SOB or lightheadedness with hypotension: No Has patient had a PCN reaction causing severe rash involving mucus membranes or skin necrosis: No Has patient had a PCN reaction that required hospitalization: No Has patient had a PCN reaction occurring within the last 10 years: No If all of the above answers are "NO", then may proceed with Cephalosporin use.   Marland Kitchen Doxycycline Hyclate Hives  and Rash    REACTION: rash / hives    Social History:   Social History   Socioeconomic History  . Marital status: Married    Spouse name: Not on file  . Number of children: Not on file  . Years of education: Not on file  . Highest education level: Not on file  Social Needs  . Financial resource strain: Not on file  . Food insecurity - worry: Not on file  . Food insecurity - inability: Not on file  . Transportation needs - medical: Not on file  . Transportation needs - non-medical: Not on file  Occupational History  . Not on file  Tobacco Use  . Smoking status: Current Every Day Smoker    Packs/day: 0.50    Types: Cigarettes  . Smokeless tobacco: Never Used  Substance and Sexual Activity  . Alcohol use: Not on file  . Drug use: Not on file  . Sexual activity: Not on file  Other Topics Concern  . Not on file  Social History Narrative  . Not on file    Family History:   The patient's family history includes Congestive Heart Failure in her father; Dementia in her mother.    ROS:  Please see the history of present illness.  All other ROS reviewed and negative.     Physical Exam/Data:   Vitals:   07/03/17 1130 07/03/17 1300 07/03/17 1400 07/03/17 1500  BP: 122/67 115/73 106/67 125/67  Pulse: 81 89 (!) 54 (!) 130  Resp: 15 11 18 17   Temp:      SpO2: 98% 96% 94% 98%  Weight:      Height:       No intake or output data in the 24 hours ending 07/03/17 1620 Filed Weights   07/03/17 1042  Weight: 180 lb (81.6 kg)   Body mass index is 30.9 kg/m.  General:  Well nourished, well developed, laying in bed in no acute distress. Smell of tobacco use apparent upon entering the room.  HEENT: non-icteric Lymph: no adenopathy Neck: no JVD Endocrine:  No thryomegaly Vascular: No carotid bruits;distal pulses 2+ bilaterally  Cardiac:  normal S1, S2; irregular rate/rhythm; no murmur/gallops/rubs Lungs:  clear to auscultation bilaterally, no wheezing, rhonchi or rales  Abd:  NABS, soft, nontender, no hepatomegaly  Ext: no edema Musculoskeletal:  No deformities, BUE and BLE strength normal and equal Skin: warm and dry  Neuro:  CNs 2-12 intact, no focal abnormalities noted Psych:  Normal affect    EKG:  The ECG that was done 07/03/17 was personally reviewed and demonstrates atrial fibrillation, rate 94, non-ischemic  Telemetry: atrial fibrillation, rate 60s-70s, max 120s  Relevant CV Studies: Echo pending  Laboratory Data:  Chemistry Recent Labs  Lab 07/03/17 1048  NA 138  K 4.4  CL 103  CO2 23  GLUCOSE 109*  BUN 15  CREATININE 0.88  CALCIUM 8.9  GFRNONAA >60  GFRAA >60  ANIONGAP 12    No results for input(s): PROT, ALBUMIN, AST, ALT, ALKPHOS, BILITOT in the last 168 hours. Hematology Recent Labs  Lab 07/03/17 1048  WBC 11.7*  RBC 5.59*  HGB 16.6*  HCT 50.4*  MCV 90.2  MCH 29.7  MCHC 32.9  RDW 13.4  PLT 251   Cardiac EnzymesNo results for input(s): TROPONINI in the last 168 hours.  Recent Labs  Lab 07/03/17 1152  TROPIPOC 0.00    BNPNo results for input(s): BNP, PROBNP in the last 168 hours.  DDimer  Recent Labs  Lab 07/03/17 1140  DDIMER 0.53*    Radiology/Studies:  Dg Chest 2 View  Result Date: 07/03/2017 CLINICAL DATA:  AFib EXAM: CHEST  2 VIEW COMPARISON:  07/03/2014 FINDINGS: The lungs are clear without focal pneumonia, edema, pneumothorax or pleural effusion. The cardiopericardial silhouette is within normal limits for size. The visualized bony structures of the thorax are intact. IMPRESSION: No active cardiopulmonary disease. Electronically Signed   By: Misty Stanley M.D.   On: 07/03/2017 11:07   Ct Angio Chest Pe W And/or Wo Contrast  Result Date: 07/03/2017 CLINICAL DATA:  Irregular heartbeat, elevated D-dimer question pulmonary embolism, unable to get breath, symptoms continued to present, suspected pulmonary embolism with moderate clinical probability EXAM: CT ANGIOGRAPHY CHEST WITH CONTRAST TECHNIQUE:  Multidetector CT imaging of the chest was performed using the standard protocol during bolus administration of intravenous contrast. Multiplanar CT image reconstructions and MIPs were obtained to evaluate the vascular anatomy. CONTRAST:  157mL ISOVUE-370 IOPAMIDOL (ISOVUE-370) INJECTION 76% IV COMPARISON:  None FINDINGS: Cardiovascular: Upper normal caliber ascending thoracic aorta. No aortic aneurysm or dissection. Pulmonary arteries well opacified and patent. No evidence of pulmonary embolism. No pericardial effusion. Mediastinum/Nodes: Esophagus unremarkable. No thoracic adenopathy. Few scattered normal size mediastinal lymph nodes. Base of cervical region unremarkable. Lungs/Pleura: Dependent atelectasis in the posterior lower lobes bilaterally. Central peribronchial thickening. No pulmonary infiltrate, pleural effusion or pneumothorax. No definite pulmonary mass/nodule. Upper Abdomen: Normal appearance Musculoskeletal: No acute abnormalities. Review of the MIP images confirms the above findings. IMPRESSION: No evidence of pulmonary embolism. Bronchitic changes with dependent atelectasis in BILATERAL lower lobes. Electronically Signed   By: Lavonia Dana M.D.   On: 07/03/2017 14:14    Assessment and Plan:   1. New onset atrial fibrillation: patient reports onset of SOB and irregular heart beat upon waking this AM. Found to be in atrial fibrillation on EKG with rates primarily in the 70s on telemetry with max 120s.  - This patients CHA2DS2-VASc Score and unadjusted Ischemic Stroke Rate (% per year) is equal to 0.6 % stroke rate/year from a score of 1 Above score calculated as 1 point each if present [CHF, HTN, DM, Vascular=MI/PAD/Aortic Plaque, Age if 65-74, or Female] Above score calculated as 2 points each if present [Age > 75, or Stroke/TIA/TE] - Will start apixaban 5mg  BID - Will start cardizem 30mg  q6hr - if tolerates well overnight, will likely transition to long acting tomorrow.  - Will check  echocardiogram to evaluate for structural cause   2. L arm pain 07/02/17: possible atypical presentation for ACS.  - EKG non-ischemic  - Will cycle troponins x3 - negative x1 at this time.   3. Bipolar disorder: - Will continue home medications  4. Mild leukocytosis: patient without infectious complaints. Possible hemoconcentration given elevated WBC/RBC/HGB/HCT - Will recheck CBC in AM   5. Mild hypoxia: patient complained of SOB improved with O2 via [place] - Ddimer elevated to 0.53 - CT Chest without evidence of PE - Wean off O2 via [place] as tolerated; 90% on RA at time of admission.  6. Tobacco abuse:  -  Counseled on smoking cessation - Will provide nicotine patch while in the hospital   Severity of Illness: The appropriate patient status for this patient is OBSERVATION. Observation status is judged to be reasonable and necessary in order to provide the required intensity of service to ensure the patient's safety. The patient's presenting symptoms, physical exam findings, and initial radiographic and laboratory data in the context of their medical condition is felt to place them at decreased risk for further clinical deterioration. Furthermore, it is anticipated that the patient will be medically stable for discharge from the hospital within 2 midnights of admission. The following factors support the patient status of observation.   " The patient's presenting symptoms include SOB and palpitations " The physical exam findings include irregular heart rate/rhythm; mild hypoxia. " The initial radiographic and laboratory data are EKG with afib RVR. CT chest negative for PE. CXR without acute findings     For questions or updates, please contact [place] Please consult [email or web address] for contact info under Cardiology/STEMI.    Signed, Abigail Butts, PA-C  07/03/2017 4:20 PM  As above, patient seen and examined.  Briefly she is a 62 year old female with past medical history of  bipolar disorder with new onset atrial fibrillation.  No prior cardiac history.  Yesterday had approximately 4 PM she was at a nail salon and stepped outside to smoke a cigarette.  She then developed palpitations described as her heart racing.  There was associated dyspnea but no chest pain.  There was mild dizziness.  She did describe some left arm discomfort.  She has continued to have intermittent palpitations and she presented to the emergency room and was found to be in atrial fibrillation.  Cardiology asked to evaluate.  Electrocardiogram shows atrial fibrillation.  Hemoglobin elevated at 16.6.  TSH 2.286.  1 new onset atrial fibrillation-we will control heart rate with Cardizem 30 mg by mouth every 6 hours.  Follow heart rate and transition to CD tomorrow morning.  TSH is normal.  Schedule echocardiogram to assess LV function.  Her only embolic risk factor is female sex.  Chadsvasc 1. We will begin apixaban 5 mg twice daily.  If she does not convert and is asymptomatic tomorrow morning we could discharge and cardiovert in 3 weeks.  If she does convert we will continue anticoagulation for 4 weeks and then discontinue long-term.  2 left upper extremity discomfort-doubt anginal equivalent.  Cycle enzymes and if negative no further ischemia evaluation.  3 tobacco abuse-patient counseled on discontinuing.  4 bipolar disorder-follow-up primary care.  Kirk Ruths, MD

## 2017-07-04 ENCOUNTER — Other Ambulatory Visit: Payer: Self-pay

## 2017-07-04 ENCOUNTER — Other Ambulatory Visit: Payer: Self-pay | Admitting: Student

## 2017-07-04 ENCOUNTER — Other Ambulatory Visit (HOSPITAL_COMMUNITY): Payer: Medicare HMO

## 2017-07-04 DIAGNOSIS — I480 Paroxysmal atrial fibrillation: Secondary | ICD-10-CM | POA: Diagnosis not present

## 2017-07-04 DIAGNOSIS — I481 Persistent atrial fibrillation: Secondary | ICD-10-CM | POA: Diagnosis not present

## 2017-07-04 DIAGNOSIS — R002 Palpitations: Secondary | ICD-10-CM

## 2017-07-04 LAB — CBC
HCT: 45.5 % (ref 36.0–46.0)
Hemoglobin: 14.6 g/dL (ref 12.0–15.0)
MCH: 28.9 pg (ref 26.0–34.0)
MCHC: 32.1 g/dL (ref 30.0–36.0)
MCV: 90.1 fL (ref 78.0–100.0)
Platelets: 244 10*3/uL (ref 150–400)
RBC: 5.05 MIL/uL (ref 3.87–5.11)
RDW: 13.6 % (ref 11.5–15.5)
WBC: 10.4 10*3/uL (ref 4.0–10.5)

## 2017-07-04 LAB — HIV ANTIBODY (ROUTINE TESTING W REFLEX): HIV Screen 4th Generation wRfx: NONREACTIVE

## 2017-07-04 LAB — GLUCOSE, CAPILLARY: Glucose-Capillary: 135 mg/dL — ABNORMAL HIGH (ref 65–99)

## 2017-07-04 LAB — TROPONIN I: Troponin I: <0.03 ng/mL (ref <0.03)

## 2017-07-04 MED ORDER — DILTIAZEM HCL ER COATED BEADS 120 MG PO CP24
120.0000 mg | ORAL_CAPSULE | Freq: Every day | ORAL | Status: DC
  Administered 2017-07-04: 120 mg via ORAL
  Filled 2017-07-04: qty 1

## 2017-07-04 MED ORDER — DILTIAZEM HCL ER COATED BEADS 120 MG PO CP24: 120.0000 mg | ORAL_CAPSULE | Freq: Every day | ORAL | 5 refills | Status: DC

## 2017-07-04 MED ORDER — APIXABAN 5 MG PO TABS: 5.0000 mg | ORAL_TABLET | Freq: Two times a day (BID) | ORAL | 0 refills | Status: DC

## 2017-07-04 NOTE — Progress Notes (Signed)
Progress Note  Patient Name: Amanda [name] Date of Encounter: 07/04/2017  Primary Cardiologist: Kirk Ruths, MD   Subjective   No chest pain; no dyspnea  Inpatient Medications    Scheduled Meds: . apixaban  5 mg Oral BID  . buprenorphine-naloxone  1 tablet Sublingual Daily  . citalopram  20 mg Oral QHS  . diltiazem  30 mg Oral Q6H  . gabapentin  100 mg Oral QHS  . lamoTRIgine  200 mg Oral QHS  . nicotine  21 mg Transdermal Daily  . risperiDONE  1 mg Oral QHS   Continuous Infusions:  PRN Meds: acetaminophen, ondansetron (ZOFRAN) IV   Vital Signs    Vitals:   07/03/17 1848 07/04/17 0015 07/04/17 0509 07/04/17 0542  BP: 131/80 (!) 132/51 133/69 116/65  Pulse: 70 61 62   Resp:  18    Temp: 98.2 F (36.8 C) 98.5 F (36.9 C) 98 F (36.7 C)   TempSrc: Oral Oral Oral   SpO2: 99% 97% 92%   Weight:   180 lb (81.6 kg)   Height:        Intake/Output Summary (Last 24 hours) at 07/04/2017 0954 Last data filed at 07/04/2017 0919 Gross per 24 hour  Intake 25 ml  Output -  Net 25 ml   Filed Weights   07/03/17 1042 07/04/17 0509  Weight: 180 lb (81.6 kg) 180 lb (81.6 kg)    Telemetry    Atrial fibrillation converting to sinus- Personally Reviewed   Physical Exam   GEN: No acute distress.   Neck: No JVD Cardiac: RRR, no murmurs, rubs, or gallops.  Respiratory: Clear to auscultation bilaterally. GI: Soft, nontender, non-distended  MS: No edema Neuro:  Nonfocal  Psych: Normal affect   Labs    Chemistry Recent Labs  Lab 07/03/17 1048  NA 138  K 4.4  CL 103  CO2 23  GLUCOSE 109*  BUN 15  CREATININE 0.88  CALCIUM 8.9  GFRNONAA >60  GFRAA >60  ANIONGAP 12     Hematology Recent Labs  Lab 07/03/17 1048 07/04/17 0455  WBC 11.7* 10.4  RBC 5.59* 5.05  HGB 16.6* 14.6  HCT 50.4* 45.5  MCV 90.2 90.1  MCH 29.7 28.9  MCHC 32.9 32.1  RDW 13.4 13.6  PLT 251 244    Cardiac Enzymes Recent Labs  Lab 07/03/17 1824 07/03/17 2351    TROPONINI <0.03 <0.03    Recent Labs  Lab 07/03/17 1152  TROPIPOC 0.00     DDimer  Recent Labs  Lab 07/03/17 1140  DDIMER 0.53*     Radiology    Dg Chest 2 View  Result Date: 07/03/2017 CLINICAL DATA:  AFib EXAM: CHEST  2 VIEW COMPARISON:  07/03/2014 FINDINGS: The lungs are clear without focal pneumonia, edema, pneumothorax or pleural effusion. The cardiopericardial silhouette is within normal limits for size. The visualized bony structures of the thorax are intact. IMPRESSION: No active cardiopulmonary disease. Electronically Signed   By: Misty Stanley M.D.   On: 07/03/2017 11:07   Ct Angio Chest Pe W And/or Wo Contrast  Result Date: 07/03/2017 CLINICAL DATA:  Irregular heartbeat, elevated D-dimer question pulmonary embolism, unable to get breath, symptoms continued to present, suspected pulmonary embolism with moderate clinical probability EXAM: CT ANGIOGRAPHY CHEST WITH CONTRAST TECHNIQUE: Multidetector CT imaging of the chest was performed using the standard protocol during bolus administration of intravenous contrast. Multiplanar CT image reconstructions and MIPs were obtained to evaluate the vascular anatomy. CONTRAST:  146mL ISOVUE-370 IOPAMIDOL (  ISOVUE-370) INJECTION 76% IV COMPARISON:  None FINDINGS: Cardiovascular: Upper normal caliber ascending thoracic aorta. No aortic aneurysm or dissection. Pulmonary arteries well opacified and patent. No evidence of pulmonary embolism. No pericardial effusion. Mediastinum/Nodes: Esophagus unremarkable. No thoracic adenopathy. Few scattered normal size mediastinal lymph nodes. Base of cervical region unremarkable. Lungs/Pleura: Dependent atelectasis in the posterior lower lobes bilaterally. Central peribronchial thickening. No pulmonary infiltrate, pleural effusion or pneumothorax. No definite pulmonary mass/nodule. Upper Abdomen: Normal appearance Musculoskeletal: No acute abnormalities. Review of the MIP images confirms the above findings.  IMPRESSION: No evidence of pulmonary embolism. Bronchitic changes with dependent atelectasis in BILATERAL lower lobes. Electronically Signed   By: Lavonia Dana M.D.   On: 07/03/2017 14:14    Patient Profile     62 year old female with past medical history of bipolar disorder with new onset atrial fibrillation.  TSH 2.286.    Assessment & Plan    1 new onset atrial fibrillation-patient has converted to sinus rhythm.  I will change Cardizem to 120 mg daily for rate control if atrial fibrillation recurs.  Note TSH is normal.  Continue apixaban for 4 weeks.  Chadsvasc 1 for female sex.  Discontinue apixaban after 4 weeks of therapy.  Plan echocardiogram to assess LV function as an outpatient.  If more frequent episodes in the future we will consider an antiarrhythmic.    2 left upper extremity discomfort-enzymes negative; no further ischemia eval.  3 tobacco abuse-patient counseled on discontinuing.  4 bipolar disorder-follow-up primary care.  Discharge today.  Follow-up with APP 4 weeks.  Follow-up with me in 3 months. > 30 min PA and physician time D2  For questions or updates, please contact [place] Please consult [email or web address] for contact info under Cardiology/STEMI.      Signed, Kirk Ruths, MD  07/04/2017, 9:54 AM

## 2017-07-04 NOTE — Progress Notes (Signed)
Patient received discharge information and acknowledged understanding of it. Patient has eliquis card. Patient IV was removed.

## 2017-07-04 NOTE — Discharge Summary (Signed)
Discharge Summary    Patient ID: Amanda [name],  MRN: [id number], DOB/AGE: [birth year] 60 y.o.  Admit date: 07/03/2017 Discharge date: 07/04/2017  Primary Care Provider: Marletta Lor Primary Cardiologist: Dr. Stanford Breed  Discharge Diagnoses    Principal Problem:   Atrial fibrillation [facility]) Active Problems:   Tobacco abuse   History of Present Illness     Amanda [name] is a 62 y.o. female with past medical history of bipolar disorder, GERD, and tobacco use who presented to [facility] on 07/03/2017 for evaluation of an irregular heart rate.   She was in her usual state of health until she awoke earlier that morning and had new-onset palpitations with associated dyspnea. She went to an Urgent Care where an EKG was performed and showed new-onset atrial fibrillation, therefore she was referred to [facility] for further evaluation.   While in the ED, her HR was initially in the 130's but improved to the 60's to 70's with administration of Cardizem. Initial labs showed WBC 11.7, Hgb 16.6, platelets 251, Na+ 138, K+ 4.4, and creatinine 0.88. TSH and Mg WNL with initial troponin being negative. D-dimer was elevated but CTA showed no evidence of a pulmonary embolism.   She was evaluated by Cardiology and transitioned to PO Cardizem along with being initiated on Eliquis 5mg  BID.   Hospital Course     Consultants: None  The following morning, she denied any recurrent chest pain, palpitations, or dyspnea. She spontaneously converted to NSR overnight and cardiac enzymes remained negative.   She was examined by Dr. Stanford Breed and deemed stable for discharge. Short-acting Cardizem was transitioned to PO Cardizem CD 120mg  daily. She will continue on Eliquis for 4 weeks then discontinue anticoagulation as her CHA2DS2-VASc Score is only 1 (female sex). An outpatient echocardiogram will be obtained to assess LV function and valve function. Cardiology follow-up has been arranged. She  was discharged home in good condition.   _____________  Discharge Vitals Blood pressure 116/65, pulse 62, temperature 98 F (36.7 C), temperature source Oral, resp. rate 18, height 5\' 4"  (1.626 m), weight 180 lb (81.6 kg), SpO2 92 %.  Filed Weights   07/03/17 1042 07/04/17 0509  Weight: 180 lb (81.6 kg) 180 lb (81.6 kg)    Labs & Radiologic Studies     CBC Recent Labs    07/03/17 1048 07/04/17 0455  WBC 11.7* 10.4  HGB 16.6* 14.6  HCT 50.4* 45.5  MCV 90.2 90.1  PLT 251 462   Basic Metabolic Panel Recent Labs    07/03/17 1048 07/03/17 1121  NA 138  --   K 4.4  --   CL 103  --   CO2 23  --   GLUCOSE 109*  --   BUN 15  --   CREATININE 0.88  --   CALCIUM 8.9  --   MG  --  2.1   Liver Function Tests No results for input(s): AST, ALT, ALKPHOS, BILITOT, PROT, ALBUMIN in the last 72 hours. No results for input(s): LIPASE, AMYLASE in the last 72 hours. Cardiac Enzymes Recent Labs    07/03/17 1824 07/03/17 2351  TROPONINI <0.03 <0.03   BNP Invalid input(s): POCBNP D-Dimer Recent Labs    07/03/17 1140  DDIMER 0.53*   Hemoglobin A1C No results for input(s): HGBA1C in the last 72 hours. Fasting Lipid Panel No results for input(s): CHOL, HDL, LDLCALC, TRIG, CHOLHDL, LDLDIRECT in the last 72 hours. Thyroid Function Tests Recent Labs    07/03/17  1132  TSH 2.286    Dg Chest 2 View  Result Date: 07/03/2017 CLINICAL DATA:  AFib EXAM: CHEST  2 VIEW COMPARISON:  07/03/2014 FINDINGS: The lungs are clear without focal pneumonia, edema, pneumothorax or pleural effusion. The cardiopericardial silhouette is within normal limits for size. The visualized bony structures of the thorax are intact. IMPRESSION: No active cardiopulmonary disease. Electronically Signed   By: Misty Stanley M.D.   On: 07/03/2017 11:07   Ct Angio Chest Pe W And/or Wo Contrast  Result Date: 07/03/2017 CLINICAL DATA:  Irregular heartbeat, elevated D-dimer question pulmonary embolism, unable to  get breath, symptoms continued to present, suspected pulmonary embolism with moderate clinical probability EXAM: CT ANGIOGRAPHY CHEST WITH CONTRAST TECHNIQUE: Multidetector CT imaging of the chest was performed using the standard protocol during bolus administration of intravenous contrast. Multiplanar CT image reconstructions and MIPs were obtained to evaluate the vascular anatomy. CONTRAST:  134mL ISOVUE-370 IOPAMIDOL (ISOVUE-370) INJECTION 76% IV COMPARISON:  None FINDINGS: Cardiovascular: Upper normal caliber ascending thoracic aorta. No aortic aneurysm or dissection. Pulmonary arteries well opacified and patent. No evidence of pulmonary embolism. No pericardial effusion. Mediastinum/Nodes: Esophagus unremarkable. No thoracic adenopathy. Few scattered normal size mediastinal lymph nodes. Base of cervical region unremarkable. Lungs/Pleura: Dependent atelectasis in the posterior lower lobes bilaterally. Central peribronchial thickening. No pulmonary infiltrate, pleural effusion or pneumothorax. No definite pulmonary mass/nodule. Upper Abdomen: Normal appearance Musculoskeletal: No acute abnormalities. Review of the MIP images confirms the above findings. IMPRESSION: No evidence of pulmonary embolism. Bronchitic changes with dependent atelectasis in BILATERAL lower lobes. Electronically Signed   By: Lavonia Dana M.D.   On: 07/03/2017 14:14     Diagnostic Studies/Procedures    None Performed  Disposition   Pt is being discharged home today in good condition.  Follow-up Plans & Appointments    Follow-up Information    Lendon Colonel, NP Follow up on 07/29/2017.   Specialties:  Nurse Practitioner, Radiology, Cardiology Why:  Dana on 07/29/2017 at 10:30AM. The office will contact you in regards to scheduling your echocardiogram prior to the office visit. The echo will likely be performed at our [facility] location.  Contact information: [address] [place] [address] [phone]          Discharge Instructions    Diet - low sodium heart healthy   Complete by:  As directed    Increase activity slowly   Complete by:  As directed       Discharge Medications     Medication List    TAKE these medications   apixaban 5 MG Tabs tablet Commonly known as:  ELIQUIS Take 1 tablet (5 mg total) by mouth 2 (two) times daily.   citalopram 20 MG tablet Commonly known as:  CELEXA Take 20 mg by mouth at bedtime.   clotrimazole-betamethasone cream Commonly known as:  LOTRISONE APPLY TOPICALLY TWICE DAILY   diltiazem 120 MG 24 hr capsule Commonly known as:  CARDIZEM CD Take 1 capsule (120 mg total) by mouth daily.   gabapentin 100 MG capsule Commonly known as:  NEURONTIN Take 100 mg by mouth at bedtime.   lamoTRIgine 200 MG tablet Commonly known as:  LAMICTAL Take 200 mg by mouth at bedtime.   ondansetron 4 MG tablet Commonly known as:  ZOFRAN Take 4 mg by mouth every 6 (six) hours as needed for nausea/vomiting.   risperiDONE 1 MG tablet Commonly known as:  RISPERDAL Take 1 mg by mouth at bedtime.  ZUBSOLV 1.4-0.36 MG Subl Generic drug:  Buprenorphine HCl-Naloxone HCl Place 1 tablet under the tongue every morning.         Allergies Allergies  Allergen Reactions  . Augmentin [Amoxicillin-Pot Clavulanate] Other (See Comments)    Has patient had a PCN reaction causing immediate rash, facial/tongue/throat swelling, SOB or lightheadedness with hypotension: No Has patient had a PCN reaction causing severe rash involving mucus membranes or skin necrosis: No Has patient had a PCN reaction that required hospitalization: No Has patient had a PCN reaction occurring within the last 10 years: No If all of the above answers are "NO", then may proceed with Cephalosporin use.   Marland Kitchen Doxycycline Hyclate Hives and Rash    REACTION: rash / hives     Outstanding Labs/Studies   Echocardiogram as an  Outpatient  Duration of Discharge Encounter   Greater than 30 minutes including physician time.  Signed, Erma Heritage, PA-C 07/04/2017, 10:50 AM

## 2017-07-12 ENCOUNTER — Ambulatory Visit (HOSPITAL_COMMUNITY): Payer: Medicare HMO | Attending: Cardiovascular Disease

## 2017-07-12 ENCOUNTER — Other Ambulatory Visit: Payer: Self-pay

## 2017-07-12 DIAGNOSIS — F17210 Nicotine dependence, cigarettes, uncomplicated: Secondary | ICD-10-CM | POA: Insufficient documentation

## 2017-07-12 DIAGNOSIS — I480 Paroxysmal atrial fibrillation: Secondary | ICD-10-CM | POA: Diagnosis not present

## 2017-07-12 DIAGNOSIS — I517 Cardiomegaly: Secondary | ICD-10-CM | POA: Diagnosis not present

## 2017-07-12 DIAGNOSIS — R002 Palpitations: Secondary | ICD-10-CM | POA: Diagnosis not present

## 2017-07-22 DIAGNOSIS — Z79899 Other long term (current) drug therapy: Secondary | ICD-10-CM | POA: Diagnosis not present

## 2017-07-22 DIAGNOSIS — F1120 Opioid dependence, uncomplicated: Secondary | ICD-10-CM | POA: Diagnosis not present

## 2017-07-22 DIAGNOSIS — M545 Low back pain: Secondary | ICD-10-CM | POA: Diagnosis not present

## 2017-07-26 NOTE — Progress Notes (Signed)
Cardiology Office Note   Date:  07/29/2017   ID:  Amanda [name], DOB 07/04/1955, MRN [id number]  PCP:  Jani Gravel, MD  Cardiologist:  Dr. Stanford Breed  Chief Complaint  Patient presents with  . Hospitalization Follow-up  . Atrial Fibrillation     History of Present Illness: Amanda [name] is a 62 y.o. female who presents for consultation follow-up after admission for new onset atrial fibrillation. He  was in her usual state of health when she awoke having palpitations and associated dyspnea. She was evaluated in urgent care where EKG performed atrial fibrillation and therefore was referred to [facility] for further evaluation.  Initially on admission her 130, but improved the administration. I will was found to be normal she is not found to be anemic or dehydrated. She again was negative for pulmonary embolism. She was started on a diltiazem drip and was transitioned to by mouth diltiazem IV 20 mg daily, and initiated on ELIQUIS 5 mg twice a day.   She was to have a follow-up outpatient echocardiogram to assess LV function malfunction. Echocardiogram was completed on 07/12/2017.  Study Conclusions  - Left ventricle: The cavity size was normal. There was borderline   concentric hypertrophy. Systolic function was normal. The   estimated ejection fraction was in the range of 55% to 60%. Wall   motion was normal; there were no regional wall motion   abnormalities. Left ventricular diastolic function parameters   were normal. - Left atrium: The atrium was mildly dilated.  She comes today feeling very well. She has cut back on her smoking from 2 1/2 ppd to 1/2-1/4 pack a day. She tells me two cartons were costing her $ 79 a week. She states she is feeling much better and wants to quit completely.   She states that she has had only one or two episode of rapid HR since leaving the hospital, and this occurred when she was smoking. She denies bleeding issues on Eliquis.  Past Medical  History:  Diagnosis Date  . DEGENERATIVE JOINT DISEASE 11/12/2006  . DISORDER, BIPOLAR NOS 02/28/2007  . GERD 11/12/2006    Past Surgical History:  Procedure Laterality Date  . ABDOMINAL HYSTERECTOMY    . BREAST SURGERY     reduction  . KNEE ARTHROSCOPY    . TONSILLECTOMY       Current Outpatient Medications  Medication Sig Dispense Refill  . apixaban (ELIQUIS) 5 MG TABS tablet Take 1 tablet (5 mg total) by mouth daily. 90 tablet 3  . Buprenorphine HCl-Naloxone HCl (ZUBSOLV) 1.4-0.36 MG SUBL Place 2 tablets under the tongue every morning.     . citalopram (CELEXA) 20 MG tablet Take 20 mg by mouth at bedtime.    . clotrimazole-betamethasone (LOTRISONE) cream APPLY TOPICALLY TWICE DAILY 120 g 1  . diltiazem (CARDIZEM CD) 120 MG 24 hr capsule Take 1 capsule (120 mg total) by mouth daily. 90 capsule 3  . gabapentin (NEURONTIN) 100 MG capsule Take 100 mg by mouth at bedtime.    . lamoTRIgine (LAMICTAL) 200 MG tablet Take 200 mg by mouth at bedtime.    . ondansetron (ZOFRAN) 4 MG tablet Take 4 mg by mouth every 6 (six) hours as needed for nausea/vomiting.    . risperiDONE (RISPERDAL) 1 MG tablet Take 1 mg by mouth at bedtime.     No current facility-administered medications for this visit.     Allergies:   Augmentin [amoxicillin-pot clavulanate] and Doxycycline hyclate    Social History:  The patient  reports that she has been smoking cigarettes.  She has been smoking about 0.05 packs per day. she has never used smokeless tobacco. She reports that she uses drugs. Drug: Marijuana. Frequency: 7.00 times per week. She reports that she does not drink alcohol.   Family History:  The patient's family history includes Congestive Heart Failure in her father; Dementia in her mother.    ROS: All other systems are reviewed and negative. Unless otherwise mentioned in H&P    PHYSICAL EXAM: VS:  BP 120/76   Pulse 68   Ht 5\' 4"  (1.626 m)   Wt 175 lb (79.4 kg)   BMI 30.04 kg/m  , BMI Body mass  index is 30.04 kg/m. GEN: Well nourished, well developed, in no acute distress  HEENT: normal  Neck: no JVD, carotid bruits, or masses Cardiac: RRR; no murmurs, rubs, or gallops,no edema  Respiratory:  clear to auscultation bilaterally, normal work of breathing GI: soft, nontender, nondistended, + BS MS: no deformity or atrophy  Skin: warm and dry, no rash Neuro:  Strength and sensation are intact Psych: euthymic mood, full affect   EKG: NSR rate of 88 bpm.    Recent Labs: 07/03/2017: BUN 15; Creatinine, Ser 0.88; Magnesium 2.1; Potassium 4.4; Sodium 138; TSH 2.286 07/04/2017: Hemoglobin 14.6; Platelets 244    Lipid Panel    Component Value Date/Time   CHOL 238 (HH) 10/29/2006 1047   LDLDIRECT 143.7 10/29/2006 1047      Wt Readings from Last 3 Encounters:  07/29/17 175 lb (79.4 kg)  07/04/17 180 lb (81.6 kg)  09/04/10 163 lb (73.9 kg)      Other studies Reviewed: Echocardiogram July 24, 2017 Left ventricle: The cavity size was normal. There was borderline   concentric hypertrophy. Systolic function was normal. The   estimated ejection fraction was in the range of 55% to 60%. Wall   motion was normal; there were no regional wall motion   abnormalities. Left ventricular diastolic function parameters   were normal. - Left atrium: The atrium was mildly dilated.  ASSESSMENT AND PLAN:  1.  PAF: Currently in NSR. She has had two episodes of rapid HR usually with smoking since discharge. Lasting only a few minutes and going away on its own. I have advised her to continue diltiazem and Eliquis as directed. If she has more "break through" rapid HR, she is to call us. May need to increase diltiazem to 180 mg daily. BP is 120/76 today. Check H&H and BMET today.   2. Ongoing tobacco abuse: She is very committed to quitting smoking and has significantly reduced her consumption. I have encouraged her in her efforts to stop completely.     Current medicines are reviewed at length with  the patient today.    Labs/ tests ordered today include: H/H BMET.   Phill Myron. West Pugh, ANP, AACC   07/29/2017 12:29 PM    [place] [address] [place], [place] [address] Phone: [phone]; Fax: [phone]

## 2017-07-29 ENCOUNTER — Ambulatory Visit: Payer: Medicare HMO | Admitting: Adult Health

## 2017-07-29 ENCOUNTER — Encounter: Payer: Self-pay | Admitting: Adult Health

## 2017-07-29 VITALS — BP 120/76 | HR 68 | Ht 64.0 in | Wt 175.0 lb

## 2017-07-29 DIAGNOSIS — I480 Paroxysmal atrial fibrillation: Secondary | ICD-10-CM

## 2017-07-29 DIAGNOSIS — Z720 Tobacco use: Secondary | ICD-10-CM

## 2017-07-29 DIAGNOSIS — Z79899 Other long term (current) drug therapy: Secondary | ICD-10-CM

## 2017-07-29 MED ORDER — DILTIAZEM HCL ER COATED BEADS 120 MG PO CP24: 120.0000 mg | ORAL_CAPSULE | Freq: Every day | ORAL | 3 refills | Status: DC

## 2017-07-29 MED ORDER — APIXABAN 5 MG PO TABS: 5.0000 mg | ORAL_TABLET | Freq: Every day | ORAL | 3 refills | Status: DC

## 2017-07-29 NOTE — Patient Instructions (Signed)
Medication Instructions:  NO CHANGES-Your physician recommends that you continue on your current medications as directed. Please refer to the Current Medication list given to you today.  If you need a refill on your cardiac medications before your next appointment, please call your pharmacy.  Labwork: BMET AND H&H TODAY HERE IN OUR OFFICE AT [facility]  Special Instructions: CALL IF YOU CONTINUE TO HAVE PALPITATIONS-RACING HEARTBEAT  Follow-Up: Your physician wants you to follow-up in: 3-4 MONTHS WITH DR Stanford Breed.   Thank you for choosing [facility] at [facility]!!

## 2017-07-30 LAB — BASIC METABOLIC PANEL
BUN/Creatinine Ratio: 16 (ref 9–23)
BUN: 16 mg/dL (ref 6–24)
CO2: 25 mmol/L (ref 20–29)
Calcium: 9.4 mg/dL (ref 8.7–10.2)
Chloride: 100 mmol/L (ref 96–106)
Creatinine, Ser: 0.97 mg/dL (ref 0.57–1.00)
GFR calc Af Amer: 74 mL/min/{1.73_m2} (ref >59)
GFR calc non Af Amer: 64 mL/min/{1.73_m2} (ref >59)
Glucose: 86 mg/dL (ref 65–99)
Potassium: 4.6 mmol/L (ref 3.5–5.2)
Sodium: 144 mmol/L (ref 134–144)

## 2017-07-30 LAB — HEMOGLOBIN AND HEMATOCRIT, BLOOD
Hematocrit: 44.5 % (ref 34.0–46.6)
Hemoglobin: 15 g/dL (ref 11.1–15.9)

## 2017-08-02 DIAGNOSIS — Z1231 Encounter for screening mammogram for malignant neoplasm of breast: Secondary | ICD-10-CM | POA: Diagnosis not present

## 2017-08-02 DIAGNOSIS — Z1272 Encounter for screening for malignant neoplasm of vagina: Secondary | ICD-10-CM | POA: Diagnosis not present

## 2017-08-02 DIAGNOSIS — Z01419 Encounter for gynecological examination (general) (routine) without abnormal findings: Secondary | ICD-10-CM | POA: Diagnosis not present

## 2017-08-02 LAB — HM PAP SMEAR: HM Pap smear: NORMAL

## 2017-08-19 DIAGNOSIS — F1120 Opioid dependence, uncomplicated: Secondary | ICD-10-CM | POA: Diagnosis not present

## 2017-08-19 DIAGNOSIS — F1129 Opioid dependence with unspecified opioid-induced disorder: Secondary | ICD-10-CM | POA: Diagnosis not present

## 2017-08-19 DIAGNOSIS — F319 Bipolar disorder, unspecified: Secondary | ICD-10-CM | POA: Diagnosis not present

## 2017-08-19 DIAGNOSIS — G8929 Other chronic pain: Secondary | ICD-10-CM | POA: Diagnosis not present

## 2017-08-19 DIAGNOSIS — Z5181 Encounter for therapeutic drug level monitoring: Secondary | ICD-10-CM | POA: Diagnosis not present

## 2017-08-19 DIAGNOSIS — M25562 Pain in left knee: Secondary | ICD-10-CM | POA: Diagnosis not present

## 2017-08-23 DIAGNOSIS — M1712 Unilateral primary osteoarthritis, left knee: Secondary | ICD-10-CM | POA: Insufficient documentation

## 2017-08-23 DIAGNOSIS — M25562 Pain in left knee: Secondary | ICD-10-CM | POA: Diagnosis not present

## 2017-08-26 DIAGNOSIS — F3175 Bipolar disorder, in partial remission, most recent episode depressed: Secondary | ICD-10-CM | POA: Diagnosis not present

## 2017-08-30 DIAGNOSIS — M1712 Unilateral primary osteoarthritis, left knee: Secondary | ICD-10-CM | POA: Diagnosis not present

## 2017-09-07 DIAGNOSIS — M1712 Unilateral primary osteoarthritis, left knee: Secondary | ICD-10-CM | POA: Diagnosis not present

## 2017-09-16 DIAGNOSIS — F1129 Opioid dependence with unspecified opioid-induced disorder: Secondary | ICD-10-CM | POA: Diagnosis not present

## 2017-09-16 DIAGNOSIS — Z5181 Encounter for therapeutic drug level monitoring: Secondary | ICD-10-CM | POA: Diagnosis not present

## 2017-09-16 DIAGNOSIS — H9201 Otalgia, right ear: Secondary | ICD-10-CM | POA: Diagnosis not present

## 2017-10-15 DIAGNOSIS — F1120 Opioid dependence, uncomplicated: Secondary | ICD-10-CM | POA: Diagnosis not present

## 2017-10-15 DIAGNOSIS — G8929 Other chronic pain: Secondary | ICD-10-CM | POA: Diagnosis not present

## 2017-10-18 DIAGNOSIS — F3175 Bipolar disorder, in partial remission, most recent episode depressed: Secondary | ICD-10-CM | POA: Diagnosis not present

## 2017-11-12 DIAGNOSIS — Z5181 Encounter for therapeutic drug level monitoring: Secondary | ICD-10-CM | POA: Diagnosis not present

## 2017-11-12 DIAGNOSIS — F1129 Opioid dependence with unspecified opioid-induced disorder: Secondary | ICD-10-CM | POA: Diagnosis not present

## 2017-11-18 DIAGNOSIS — F3175 Bipolar disorder, in partial remission, most recent episode depressed: Secondary | ICD-10-CM | POA: Diagnosis not present

## 2017-11-22 ENCOUNTER — Ambulatory Visit: Payer: Medicare HMO | Admitting: Cardiology

## 2017-12-08 DIAGNOSIS — Z79899 Other long term (current) drug therapy: Secondary | ICD-10-CM | POA: Diagnosis not present

## 2017-12-08 DIAGNOSIS — F1129 Opioid dependence with unspecified opioid-induced disorder: Secondary | ICD-10-CM | POA: Diagnosis not present

## 2017-12-08 DIAGNOSIS — Z5181 Encounter for therapeutic drug level monitoring: Secondary | ICD-10-CM | POA: Diagnosis not present

## 2018-01-03 DIAGNOSIS — F1129 Opioid dependence with unspecified opioid-induced disorder: Secondary | ICD-10-CM | POA: Diagnosis not present

## 2018-01-03 DIAGNOSIS — Z79899 Other long term (current) drug therapy: Secondary | ICD-10-CM | POA: Diagnosis not present

## 2018-01-03 DIAGNOSIS — Z5181 Encounter for therapeutic drug level monitoring: Secondary | ICD-10-CM | POA: Diagnosis not present

## 2018-01-03 DIAGNOSIS — G8929 Other chronic pain: Secondary | ICD-10-CM | POA: Diagnosis not present

## 2018-01-04 DIAGNOSIS — F3175 Bipolar disorder, in partial remission, most recent episode depressed: Secondary | ICD-10-CM | POA: Diagnosis not present

## 2018-02-14 NOTE — Progress Notes (Signed)
HPI: Follow-up atrial fibrillation.  Patient presented January 2019 with new onset atrial fibrillation.  She converted to sinus rhythm overnight and was treated with Cardizem and apixaban. Echocardiogram February 2019 showed normal LV function and mild left atrial enlargement.  Since last seen the patient denies any dyspnea on exertion, orthopnea, PND, pedal edema, palpitations, syncope or chest pain.   Current Outpatient Medications  Medication Sig Dispense Refill  . Buprenorphine HCl-Naloxone HCl (ZUBSOLV) 1.4-0.36 MG SUBL Place 2 tablets under the tongue every morning.     . citalopram (CELEXA) 20 MG tablet Take 20 mg by mouth at bedtime.    . clotrimazole-betamethasone (LOTRISONE) cream APPLY TOPICALLY TWICE DAILY 120 g 1  . gabapentin (NEURONTIN) 100 MG capsule Take 100 mg by mouth at bedtime.    . lamoTRIgine (LAMICTAL) 200 MG tablet Take 200 mg by mouth at bedtime.    . ondansetron (ZOFRAN) 4 MG tablet Take 4 mg by mouth every 6 (six) hours as needed for nausea/vomiting.    . risperiDONE (RISPERDAL) 1 MG tablet Take 1 mg by mouth at bedtime.    Marland Kitchen apixaban (ELIQUIS) 5 MG TABS tablet Take 1 tablet (5 mg total) by mouth daily. (Patient not taking: Reported on 02/15/2018) 90 tablet 3  . diltiazem (CARDIZEM CD) 120 MG 24 hr capsule Take 1 capsule (120 mg total) by mouth daily. (Patient not taking: Reported on 02/15/2018) 90 capsule 3   No current facility-administered medications for this visit.      Past Medical History:  Diagnosis Date  . DEGENERATIVE JOINT DISEASE 11/12/2006  . DISORDER, BIPOLAR NOS 02/28/2007  . GERD 11/12/2006    Past Surgical History:  Procedure Laterality Date  . ABDOMINAL HYSTERECTOMY    . BREAST SURGERY     reduction  . KNEE ARTHROSCOPY    . TONSILLECTOMY      Social History   Socioeconomic History  . Marital status: Married    Spouse name: Not on file  . Number of children: Not on file  . Years of education: Not on file  . Highest education  level: Not on file  Occupational History  . Not on file  Social Needs  . Financial resource strain: Not on file  . Food insecurity:    Worry: Not on file    Inability: Not on file  . Transportation needs:    Medical: Not on file    Non-medical: Not on file  Tobacco Use  . Smoking status: Current Every Day Smoker    Packs/day: 0.05    Types: Cigarettes  . Smokeless tobacco: Never Used  Substance and Sexual Activity  . Alcohol use: No    Frequency: Never  . Drug use: Yes    Frequency: 7.0 times per week    Types: Marijuana    Comment: daily marijuana use  . Sexual activity: Not on file  Lifestyle  . Physical activity:    Days per week: Not on file    Minutes per session: Not on file  . Stress: Not on file  Relationships  . Social connections:    Talks on phone: Not on file    Gets together: Not on file    Attends religious service: Not on file    Active member of club or organization: Not on file    Attends meetings of clubs or organizations: Not on file    Relationship status: Not on file  . Intimate partner violence:    Fear of current or  ex partner: Not on file    Emotionally abused: Not on file    Physically abused: Not on file    Forced sexual activity: Not on file  Other Topics Concern  . Not on file  Social History Narrative  . Not on file    Family History  Problem Relation Age of Onset  . Dementia Mother   . Congestive Heart Failure Father     ROS: no fevers or chills, productive cough, hemoptysis, dysphasia, odynophagia, melena, hematochezia, dysuria, hematuria, rash, seizure activity, orthopnea, PND, pedal edema, claudication. Remaining systems are negative.  Physical Exam: Well-developed well-nourished in no acute distress.  Skin is warm and dry.  HEENT is normal.  Neck is supple.  Chest is clear to auscultation with normal expansion.  Cardiovascular exam is regular rate and rhythm.  Abdominal exam nontender or distended. No masses  palpated. Extremities show no edema. neuro grossly intact  ECG-sinus rhythm at a rate of 66.  No ST changes.  Personally reviewed  A/P  1 paroxysmal atrial fibrillation-patient is in sinus rhythm today.  She discontinued her Cardizem and Eliquis 2 weeks ago because of dizziness.  Her CHADSvasc is 1 for female sex.  I therefore do not think she requires anticoagulation long-term.  I will resume Cardizem 120 mg daily and follow.  This will help control her heart rate if atrial fibrillation recurs.  If she has more frequent episodes in the future we will consider an antiarrhythmic versus ablation.  2 tobacco abuse-patient counseled on discontinuing.  3 bipolar disorder-management per primary care.  Kirk Ruths, MD

## 2018-02-15 ENCOUNTER — Ambulatory Visit: Payer: Medicare HMO | Admitting: Cardiology

## 2018-02-15 ENCOUNTER — Encounter: Payer: Self-pay | Admitting: Cardiology

## 2018-02-15 VITALS — BP 124/68 | HR 66 | Ht 62.0 in | Wt 160.0 lb

## 2018-02-15 DIAGNOSIS — Z720 Tobacco use: Secondary | ICD-10-CM

## 2018-02-15 DIAGNOSIS — I480 Paroxysmal atrial fibrillation: Secondary | ICD-10-CM

## 2018-02-15 MED ORDER — DILTIAZEM HCL ER COATED BEADS 120 MG PO CP24
120.0000 mg | ORAL_CAPSULE | Freq: Every day | ORAL | 3 refills | Status: DC
Start: 2018-02-15 — End: 2018-05-19

## 2018-02-15 NOTE — Patient Instructions (Signed)
Medication Instructions: Your physician recommends that you continue on your current medications as directed.    If you need a refill on your cardiac medications before your next appointment, please call your pharmacy.   Labwork: None  Procedures/Testing: None  Follow-Up: Your physician wants you to follow-up in 1 year with Dr. Stanford Breed. You will receive a reminder letter in the mail two months in advance. If you don't receive a letter, please call our office at [phone] to schedule this follow-up appointment.   Special Instructions:    Thank you for choosing [facility] at [facility]!!

## 2018-02-17 DIAGNOSIS — Z79899 Other long term (current) drug therapy: Secondary | ICD-10-CM | POA: Diagnosis not present

## 2018-02-17 DIAGNOSIS — I480 Paroxysmal atrial fibrillation: Secondary | ICD-10-CM | POA: Diagnosis not present

## 2018-02-17 DIAGNOSIS — Z23 Encounter for immunization: Secondary | ICD-10-CM | POA: Diagnosis not present

## 2018-02-17 DIAGNOSIS — F1129 Opioid dependence with unspecified opioid-induced disorder: Secondary | ICD-10-CM | POA: Diagnosis not present

## 2018-02-17 DIAGNOSIS — F319 Bipolar disorder, unspecified: Secondary | ICD-10-CM | POA: Diagnosis not present

## 2018-02-17 DIAGNOSIS — Z5181 Encounter for therapeutic drug level monitoring: Secondary | ICD-10-CM | POA: Diagnosis not present

## 2018-02-25 DIAGNOSIS — Z79899 Other long term (current) drug therapy: Secondary | ICD-10-CM | POA: Diagnosis not present

## 2018-02-25 DIAGNOSIS — F1120 Opioid dependence, uncomplicated: Secondary | ICD-10-CM | POA: Diagnosis not present

## 2018-03-24 ENCOUNTER — Ambulatory Visit: Payer: Medicare HMO | Admitting: Mental Health

## 2018-03-24 DIAGNOSIS — F319 Bipolar disorder, unspecified: Secondary | ICD-10-CM | POA: Diagnosis not present

## 2018-03-24 NOTE — Progress Notes (Signed)
      [facility] Counselor/Therapist Progress Note   Patient ID: Amanda [name], MRN: [id number]  Date: 03/24/2018  Timespent: 45 minutes  Treatment Type: Individual  Subjective: Positive and upbeat. Taking care of elderly mother 4 nights a week and playing with grandhildren and pet cats. Marriage is satisfactory. Mood stable. Positive anticipations   Interventions:CBT and Supportive  Mental Status Exam:   Appearance:   Casual     Behavior:  Appropriate and Sharing  Motor:  Normal  Speech/Language:   Clear and Coherent  Affect:  Positive and hopeful, grateful  Mood:  stable  Thought process:  Intact  Thought content:    WDL  Perceptual disturbances:    Normal  Orientation:  Full (Time, Place, and Person)  Attention:  Good  Concentration:  difficulty with focus and attention  Memory:  Immediate  Fund of knowledge:   Good  Insight:    Good  Judgment:   Good  Impulse Control:  good    Reported Symptoms: sleeping well, appetite normal, anxiety and depression under control.  Risk Assessment: Danger to Self:  No Self-injurious Behavior: No Danger to Others: No Duty to Warn:no Physical Aggression / Violence:No  Access to Firearms a concern: No  Gang Involvement:No   Diagnosis:   ICD-10-CM   1. Bipolar I disorder ([place]) F31.9      Plan: Continue self-care program and medication compliance           Maintain stable mood           Return to office in 2 months  Rosary Lively, [facility]

## 2018-03-31 DIAGNOSIS — F1120 Opioid dependence, uncomplicated: Secondary | ICD-10-CM | POA: Diagnosis not present

## 2018-03-31 DIAGNOSIS — Z79899 Other long term (current) drug therapy: Secondary | ICD-10-CM | POA: Diagnosis not present

## 2018-04-28 DIAGNOSIS — F1120 Opioid dependence, uncomplicated: Secondary | ICD-10-CM | POA: Diagnosis not present

## 2018-04-28 DIAGNOSIS — F1220 Cannabis dependence, uncomplicated: Secondary | ICD-10-CM | POA: Diagnosis not present

## 2018-04-28 DIAGNOSIS — Z79899 Other long term (current) drug therapy: Secondary | ICD-10-CM | POA: Diagnosis not present

## 2018-05-19 MED ORDER — DILTIAZEM HCL ER COATED BEADS 120 MG PO CP24: 120.0000 mg | ORAL_CAPSULE | Freq: Every day | ORAL | 2 refills | Status: DC

## 2018-05-24 DIAGNOSIS — Z79899 Other long term (current) drug therapy: Secondary | ICD-10-CM | POA: Diagnosis not present

## 2018-05-24 DIAGNOSIS — F1220 Cannabis dependence, uncomplicated: Secondary | ICD-10-CM | POA: Diagnosis not present

## 2018-05-26 ENCOUNTER — Ambulatory Visit: Payer: Medicare HMO | Admitting: Mental Health

## 2018-06-16 ENCOUNTER — Encounter: Payer: Self-pay | Admitting: Mental Health

## 2018-06-16 ENCOUNTER — Ambulatory Visit: Payer: Medicare HMO | Admitting: Mental Health

## 2018-06-16 DIAGNOSIS — F3175 Bipolar disorder, in partial remission, most recent episode depressed: Secondary | ICD-10-CM | POA: Diagnosis not present

## 2018-06-16 NOTE — Progress Notes (Signed)
      [facility] Counselor/Therapist Progress Note  Patient ID: Amanda [name], MRN: [id number],    Date: 06/16/2018  Time Spent: 45 minutes  Treatment Type: Individual  Reported Symptoms: Uptight, anxious, edgy. Agitated. Denies worrying.  Mental Status Exam:  Appearance:   Casual     Behavior:  Appropriate, Sharing and Agitated  Motor:  Normal  Speech/Language:   Pressured  Affect:  anxious  Mood:  anxious and irritable  Thought process:  normal  Thought content:    Rumination  Sensory/Perceptual disturbances:    WNL  Orientation:  oriented to person, place and time/date  Attention:  Good  Concentration:  Poor  Memory:  poor  Fund of knowledge:   Good  Insight:    Good  Judgment:   Good and Fair  Impulse Control:  Fair   Risk Assessment: Danger to Self:  No Self-injurious Behavior: No Danger to Others: No Duty to Warn:no Physical Aggression / Violence:No  Access to Firearms a concern: No  Gang Involvement:Yes   Subjective:  Agitated about daughter's behavior. Still not in new house. Taking responsibility for Mother with dementai who has recently been hospitalized with pneumonia.  Enjoys grandchildren  Interventions: Cognitive Behavioral Therapy, Solution-Oriented/Positive Psychology and Interpersonal  Diagnosis:   ICD-10-CM   1. 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

## 2018-06-17 DIAGNOSIS — H6123 Impacted cerumen, bilateral: Secondary | ICD-10-CM | POA: Diagnosis not present

## 2018-06-17 DIAGNOSIS — J0100 Acute maxillary sinusitis, unspecified: Secondary | ICD-10-CM | POA: Diagnosis not present

## 2018-06-28 DIAGNOSIS — F1220 Cannabis dependence, uncomplicated: Secondary | ICD-10-CM | POA: Diagnosis not present

## 2018-06-28 DIAGNOSIS — Z79899 Other long term (current) drug therapy: Secondary | ICD-10-CM | POA: Diagnosis not present

## 2018-06-28 DIAGNOSIS — F1121 Opioid dependence, in remission: Secondary | ICD-10-CM | POA: Diagnosis not present

## 2018-07-19 ENCOUNTER — Ambulatory Visit: Payer: Medicare HMO | Admitting: Physician Assistant

## 2018-07-29 DIAGNOSIS — F1220 Cannabis dependence, uncomplicated: Secondary | ICD-10-CM | POA: Diagnosis not present

## 2018-07-29 DIAGNOSIS — F1120 Opioid dependence, uncomplicated: Secondary | ICD-10-CM | POA: Diagnosis not present

## 2018-07-29 DIAGNOSIS — Z79899 Other long term (current) drug therapy: Secondary | ICD-10-CM | POA: Diagnosis not present

## 2018-08-16 ENCOUNTER — Ambulatory Visit: Payer: Medicare HMO | Admitting: Mental Health

## 2018-08-30 DIAGNOSIS — F1220 Cannabis dependence, uncomplicated: Secondary | ICD-10-CM | POA: Diagnosis not present

## 2018-08-30 DIAGNOSIS — F1120 Opioid dependence, uncomplicated: Secondary | ICD-10-CM | POA: Diagnosis not present

## 2018-08-30 DIAGNOSIS — Z79899 Other long term (current) drug therapy: Secondary | ICD-10-CM | POA: Diagnosis not present

## 2018-09-05 ENCOUNTER — Encounter: Payer: Self-pay | Admitting: Physician Assistant

## 2018-09-05 ENCOUNTER — Ambulatory Visit (INDEPENDENT_AMBULATORY_CARE_PROVIDER_SITE_OTHER): Payer: Medicare HMO | Admitting: Physician Assistant

## 2018-09-05 DIAGNOSIS — F411 Generalized anxiety disorder: Secondary | ICD-10-CM | POA: Diagnosis not present

## 2018-09-05 DIAGNOSIS — F3175 Bipolar disorder, in partial remission, most recent episode depressed: Secondary | ICD-10-CM

## 2018-09-05 MED ORDER — CITALOPRAM HYDROBROMIDE 20 MG PO TABS: 20.0000 mg | ORAL_TABLET | Freq: Every day | ORAL | 2 refills | Status: DC

## 2018-09-05 MED ORDER — GABAPENTIN 100 MG PO CAPS: 100.0000 mg | ORAL_CAPSULE | Freq: Every day | ORAL | 2 refills | Status: DC

## 2018-09-05 MED ORDER — LAMOTRIGINE 100 MG PO TABS: 100.0000 mg | ORAL_TABLET | Freq: Every day | ORAL | 2 refills | Status: DC

## 2018-09-05 MED ORDER — RISPERIDONE 1 MG PO TABS: 1.0000 mg | ORAL_TABLET | Freq: Every day | ORAL | 2 refills | Status: DC

## 2018-09-05 NOTE — Progress Notes (Signed)
[facility] Med Check  Patient ID: Amanda [name],  MRN: [id number]  PCP: Jani Gravel, MD  Date of Evaluation: 09/05/2018 Time spent:15 minutes  Chief Complaint:  Chief Complaint    Follow-up     Virtual Visit via Telephone Note  I connected with Roselle Locus on 09/05/18 at  2:30 PM EDT by telephone and verified that I am speaking with the correct person using two identifiers.   I discussed the limitations, risks, security and privacy concerns of performing an evaluation and management service by telephone and the availability of in person appointments. I also discussed with the patient that there may be a patient responsible charge related to this service. The patient expressed understanding and agreed to proceed.     HISTORY/CURRENT STATUS: HPI For routine med check.  Was last seen approximately 9 months ago.  Since that time mentally she has been doing well.  She feels like the medications work great for her.  Sadly, her mom died earlier this month.  She had been sick for a while with dementia and then the past several months was diagnosed with acute congestive heart failure.  The patient and her daughter are now going through her mom's house trying to get things in order for her daughter to move in there.  Patient denies loss of interest in usual activities and is able to enjoy things.  Denies decreased energy or motivation.  Appetite has not changed.  No extreme sadness, tearfulness, or feelings of hopelessness.  Denies any changes in concentration, making decisions or remembering things.  Denies suicidal or homicidal thoughts.  She sleeps well.  She tells me that she has been cutting the Lamictal in half for several years now.  She has never told me that.  States it works well.  Patient denies increased energy with decreased need for sleep, no increased talkativeness, no racing thoughts, no impulsivity or risky behaviors, no increased spending, no increased libido,  no grandiosity.  Denies muscle or joint pain, stiffness, or dystonia.  Denies dizziness, syncope, seizures, numbness, tingling, tremor, tics, unsteady gait, slurred speech, confusion.   Patient is not working.  She has not worked since 2010.  Individual Medical History/ Review of Systems: Changes? :No   Allergies: Augmentin [amoxicillin-pot clavulanate] and Doxycycline hyclate  Current Medications:  Current Outpatient Medications:  .  Buprenorphine HCl-Naloxone HCl (ZUBSOLV) 1.4-0.36 MG SUBL, Place 2 tablets under the tongue every morning. , Disp: , Rfl:  .  citalopram (CELEXA) 20 MG tablet, Take 1 tablet (20 mg total) by mouth at bedtime., Disp: 90 tablet, Rfl: 2 .  clotrimazole-betamethasone (LOTRISONE) cream, APPLY TOPICALLY TWICE DAILY, Disp: 120 g, Rfl: 1 .  diltiazem (CARDIZEM CD) 120 MG 24 hr capsule, Take 1 capsule (120 mg total) by mouth daily., Disp: 90 capsule, Rfl: 2 .  gabapentin (NEURONTIN) 100 MG capsule, Take 1 capsule (100 mg total) by mouth at bedtime., Disp: 90 capsule, Rfl: 2 .  ondansetron (ZOFRAN) 4 MG tablet, Take 4 mg by mouth every 6 (six) hours as needed for nausea/vomiting., Disp: , Rfl:  .  risperiDONE (RISPERDAL) 1 MG tablet, Take 1 tablet (1 mg total) by mouth at bedtime., Disp: 90 tablet, Rfl: 2 .  apixaban (ELIQUIS) 5 MG TABS tablet, Take 1 tablet (5 mg total) by mouth daily. (Patient not taking: Reported on 02/15/2018), Disp: 90 tablet, Rfl: 3 .  lamoTRIgine (LAMICTAL) 100 MG tablet, Take 1 tablet (100 mg total) by mouth daily., Disp: 90 tablet, Rfl: 2 Medication Side  Effects: none  Family Medical/ Social History: Changes? Yes Mom died from dementia earlier this month.   MENTAL HEALTH EXAM:  There were no vitals taken for this visit.There is no height or weight on file to calculate BMI.  General Appearance: Phone visit, unable to assess  Eye Contact:  Unable to assess  Speech:  Clear and Coherent  Volume:  Normal  Mood:  Euthymic  Affect:  Unable to  assess  Thought Process:  Goal Directed  Orientation:  Full (Time, Place, and Person)  Thought Content: Logical   Suicidal Thoughts:  No  Homicidal Thoughts:  No  Memory:  WNL  Judgement:  Good  Insight:  Good  Psychomotor Activity:  Unable to assess  Concentration:  Concentration: Good  Recall:  Good  Fund of Knowledge: Good  Language: Good  Assets:  Desire for Improvement  ADL's:  Intact  Cognition: WNL  Prognosis:  Good    DIAGNOSES:    ICD-10-CM   1. 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 Generalized anxiety disorder F41.1     Receiving Psychotherapy: No    RECOMMENDATIONS: Continue Celexa 20 mg daily. Continue gabapentin 100 mg nightly. Continue Lamictal 100 mg daily. Continue Risperdal 1 mg nightly. We discussed the fact that she needs labs.  I reviewed her labs from 07/2017.  Once the coronavirus pandemic is over with or at least at the next visit, we will order routine labs. Return in 9 months or sooner as needed.   Donnal Moat, PA-C   This record has been created using [employer].  Chart creation errors have been sought, but may not always have been located and corrected. Such creation errors do not reflect on the standard of medical care.

## 2018-09-27 DIAGNOSIS — J449 Chronic obstructive pulmonary disease, unspecified: Secondary | ICD-10-CM | POA: Diagnosis not present

## 2018-09-27 DIAGNOSIS — Z79899 Other long term (current) drug therapy: Secondary | ICD-10-CM | POA: Diagnosis not present

## 2018-09-27 DIAGNOSIS — F1120 Opioid dependence, uncomplicated: Secondary | ICD-10-CM | POA: Diagnosis not present

## 2018-10-24 DIAGNOSIS — F1120 Opioid dependence, uncomplicated: Secondary | ICD-10-CM | POA: Diagnosis not present

## 2018-10-24 DIAGNOSIS — Z79899 Other long term (current) drug therapy: Secondary | ICD-10-CM | POA: Diagnosis not present

## 2018-10-24 DIAGNOSIS — Z1211 Encounter for screening for malignant neoplasm of colon: Secondary | ICD-10-CM | POA: Diagnosis not present

## 2018-11-23 DIAGNOSIS — F17210 Nicotine dependence, cigarettes, uncomplicated: Secondary | ICD-10-CM | POA: Diagnosis not present

## 2018-11-23 DIAGNOSIS — F1120 Opioid dependence, uncomplicated: Secondary | ICD-10-CM | POA: Diagnosis not present

## 2018-11-23 DIAGNOSIS — Z79899 Other long term (current) drug therapy: Secondary | ICD-10-CM | POA: Diagnosis not present

## 2018-11-23 DIAGNOSIS — F1220 Cannabis dependence, uncomplicated: Secondary | ICD-10-CM | POA: Diagnosis not present

## 2019-02-16 ENCOUNTER — Other Ambulatory Visit: Payer: Self-pay | Admitting: Cardiology

## 2019-02-16 MED ORDER — DILTIAZEM HCL ER COATED BEADS 120 MG PO CP24: 120.0000 mg | ORAL_CAPSULE | Freq: Every day | ORAL | 0 refills | Status: DC

## 2019-02-16 NOTE — Telephone Encounter (Signed)
°*  STAT* If patient is at the pharmacy, call can be transferred to refill team.   1. Which medications need to be refilled? (please list name of each medication and dose if known) CARDIXEM CD  120mg    2. Which pharmacy/location (including street and city if local pharmacy) is medication to be sent to? [facility]  3. Do they need a 30 day or 90 day supply? 90  Pt aware early does not want to run out

## 2019-02-16 NOTE — Telephone Encounter (Signed)
Requested Prescriptions   Signed Prescriptions Disp Refills  . diltiazem (CARDIZEM CD) 120 MG 24 hr capsule 90 capsule 0    Sig: Take 1 capsule (120 mg total) by mouth daily.    Authorizing Provider: Lelon Perla    Ordering User: Raelene Bott, Jaydin Jalomo L

## 2019-03-21 ENCOUNTER — Other Ambulatory Visit: Payer: Self-pay | Admitting: Nurse Practitioner

## 2019-03-21 DIAGNOSIS — M542 Cervicalgia: Secondary | ICD-10-CM

## 2019-03-21 DIAGNOSIS — R519 Headache, unspecified: Secondary | ICD-10-CM

## 2019-03-28 ENCOUNTER — Other Ambulatory Visit: Payer: Medicare HMO

## 2019-03-31 NOTE — Progress Notes (Signed)
Virtual Visit via Video Note   This visit type was conducted due to national recommendations for restrictions regarding the COVID-19 Pandemic (e.g. social distancing) in an effort to limit this patient's exposure and mitigate transmission in our community.  Due to her co-morbid illnesses, this patient is at least at moderate risk for complications without adequate follow up.  This format is felt to be most appropriate for this patient at this time.  All issues noted in this document were discussed and addressed.  A limited physical exam was performed with this format.  Please refer to the patient's chart for her consent to telehealth for [facility].   Date:  04/03/2019   ID:  Amanda [name], DOB [birth year], MRN [id number]  Patient Location:Home Provider Location: Home  PCP:  Jani Gravel, MD  Cardiologist:  Dr Stanford Breed  Evaluation Performed:  Follow-Up Visit  Chief Complaint:  FU atrial fibrillation  History of Present Illness:    Follow-up atrial fibrillation.  Patient presented January 2019 with new onset atrial fibrillation.  She converted to sinus rhythm overnight and was treated with Cardizem and apixaban. Echocardiogram February 2019 showed normal LV function and mild left atrial enlargement.  Since last seen patient denies dyspnea, chest pain, palpitations or syncope.  The patient does not have symptoms concerning for COVID-19 infection (fever, chills, cough, or new shortness of breath).    Past Medical History:  Diagnosis Date  . DEGENERATIVE JOINT DISEASE 11/12/2006  . DISORDER, BIPOLAR NOS 02/28/2007  . GERD 11/12/2006   Past Surgical History:  Procedure Laterality Date  . ABDOMINAL HYSTERECTOMY    . BREAST SURGERY     reduction  . KNEE ARTHROSCOPY    . TONSILLECTOMY       Current Meds  Medication Sig  . Buprenorphine HCl-Naloxone HCl (ZUBSOLV) 1.4-0.36 MG SUBL Place 2 tablets under the tongue every morning.   . citalopram (CELEXA) 20 MG tablet Take 1 tablet  (20 mg total) by mouth at bedtime.  . clotrimazole-betamethasone (LOTRISONE) cream APPLY TOPICALLY TWICE DAILY  . diltiazem (CARDIZEM CD) 120 MG 24 hr capsule Take 1 capsule (120 mg total) by mouth daily.  Marland Kitchen ezetimibe (ZETIA) 10 MG tablet Take 10 mg by mouth daily.  Marland Kitchen gabapentin (NEURONTIN) 100 MG capsule Take 1 capsule (100 mg total) by mouth at bedtime.  . lamoTRIgine (LAMICTAL) 100 MG tablet Take 1 tablet (100 mg total) by mouth daily.  . ondansetron (ZOFRAN) 4 MG tablet Take 4 mg by mouth every 6 (six) hours as needed for nausea/vomiting.  . risperiDONE (RISPERDAL) 1 MG tablet Take 1 tablet (1 mg total) by mouth at bedtime.     Allergies:   Augmentin [amoxicillin-pot clavulanate] and Doxycycline hyclate   Social History   Tobacco Use  . Smoking status: Current Every Day Smoker    Packs/day: 2.00    Types: Cigarettes  . Smokeless tobacco: Never Used  Substance Use Topics  . Alcohol use: No    Frequency: Never  . Drug use: Not Currently    Frequency: 7.0 times per week    Types: Marijuana    Comment: daily marijuana use     Family Hx: The patient's family history includes Congestive Heart Failure in her father; Dementia in her mother.  ROS:   Please see the history of present illness.    No Fever, chills  or productive cough All other systems reviewed and are negative.   Recent Lipid Panel Lab Results  Component Value Date/Time   CHOL 238 (  HH) 10/29/2006 10:47 AM   LDLDIRECT 143.7 10/29/2006 10:47 AM    Wt Readings from Last 3 Encounters:  04/03/19 167 lb (75.8 kg)  02/15/18 160 lb (72.6 kg)  07/29/17 175 lb (79.4 kg)     Objective:    Vital Signs:  BP (!) 146/77   Pulse 76   Ht 5\' 4"  (1.626 m)   Wt 167 lb (75.8 kg)   BMI 28.67 kg/m    VITAL SIGNS:  reviewed NAD Answers questions appropriately Normal affect Remainder of physical examination not performed (telehealth visit; coronavirus pandemic)  ASSESSMENT & PLAN:    1. Paroxysmal atrial  fibrillation-based on history patient remains in sinus rhythm.  We will continue with Cardizem for rate control if atrial fibrillation recurs. Chadsvasc 1 for female sex.  We will therefore not anticoagulate.  Note her blood pressure is elevated today but she states typically controlled.  I have asked her to follow this at home and if she indeed has hypertension we would need to consider anticoagulating for CHADSvasc 2.  She will contact us with follow-up blood pressure readings.  If she has more frequent episodes of atrial fibrillation in the future we can consider antiarrhythmic therapy. 2. Elevated blood pressure-mildly elevated today.  Plan as outlined above. 3. Tobacco abuse-patient counseled on discontinuing. 4. History of bipolar disorder-followed by primary care.  COVID-19 Education: The importance of social distancing was discussed today.  Time:   Today, I have spent 17 minutes with the patient with telehealth technology discussing the above problems.     Medication Adjustments/Labs and Tests Ordered: Current medicines are reviewed at length with the patient today.  Concerns regarding medicines are outlined above.   Tests Ordered: No orders of the defined types were placed in this encounter.   Medication Changes: No orders of the defined types were placed in this encounter.   Follow Up:  Either In Person or Virtual in 1 year(s)  Signed, Kirk Ruths, MD  04/03/2019 10:41 AM    [place]

## 2019-04-03 ENCOUNTER — Encounter: Payer: Self-pay | Admitting: Cardiology

## 2019-04-03 ENCOUNTER — Telehealth (INDEPENDENT_AMBULATORY_CARE_PROVIDER_SITE_OTHER): Payer: Medicare HMO | Admitting: Cardiology

## 2019-04-03 VITALS — BP 146/77 | HR 76 | Ht 64.0 in | Wt 167.0 lb

## 2019-04-03 DIAGNOSIS — Z720 Tobacco use: Secondary | ICD-10-CM

## 2019-04-03 DIAGNOSIS — R030 Elevated blood-pressure reading, without diagnosis of hypertension: Secondary | ICD-10-CM | POA: Diagnosis not present

## 2019-04-03 DIAGNOSIS — I480 Paroxysmal atrial fibrillation: Secondary | ICD-10-CM

## 2019-04-03 NOTE — Patient Instructions (Signed)
Medication Instructions:  NO CHANGE *If you need a refill on your cardiac medications before your next appointment, please call your pharmacy*  Lab Work: If you have labs (blood work) drawn today and your tests are completely normal, you will receive your results only by: . MyChart Message (if you have MyChart) OR . A paper copy in the mail If you have any lab test that is abnormal or we need to change your treatment, we will call you to review the results.  Follow-Up: At [facility], you and your health needs are our priority.  As part of our continuing mission to provide you with exceptional heart care, we have created designated Provider Care Teams.  These Care Teams include your primary Cardiologist (physician) and Advanced Practice Providers (APPs -  Physician Assistants and Nurse Practitioners) who all work together to provide you with the care you need, when you need it.  Your next appointment:   12 months  The format for your next appointment:   In Person  Provider:   You may see Brian Crenshaw, MD or one of the following Advanced Practice Providers on your designated Care Team:    Luke Kilroy, PA-C  Callie Goodrich, PA-C  Jesse Cleaver, FNP    

## 2019-04-20 ENCOUNTER — Other Ambulatory Visit: Payer: Self-pay | Admitting: Physician Assistant

## 2019-04-24 DIAGNOSIS — R7303 Prediabetes: Secondary | ICD-10-CM | POA: Insufficient documentation

## 2019-05-19 ENCOUNTER — Telehealth: Payer: Self-pay | Admitting: *Deleted

## 2019-05-19 MED ORDER — DILTIAZEM HCL ER COATED BEADS 120 MG PO CP24: 120.0000 mg | ORAL_CAPSULE | Freq: Every day | ORAL | 3 refills | Status: DC

## 2019-05-19 NOTE — Telephone Encounter (Signed)
Patient needed refills on Diltiazem 90 day supply 3 refills Amanda [name]

## 2019-05-22 ENCOUNTER — Other Ambulatory Visit: Payer: Self-pay

## 2019-05-22 ENCOUNTER — Telehealth: Payer: Self-pay | Admitting: Physician Assistant

## 2019-05-22 MED ORDER — LAMOTRIGINE 100 MG PO TABS: 100.0000 mg | ORAL_TABLET | Freq: Every day | ORAL | 0 refills | Status: DC

## 2019-05-22 MED ORDER — CITALOPRAM HYDROBROMIDE 20 MG PO TABS: 20.0000 mg | ORAL_TABLET | Freq: Every day | ORAL | 0 refills | Status: DC

## 2019-05-22 MED ORDER — GABAPENTIN 100 MG PO CAPS: 100.0000 mg | ORAL_CAPSULE | Freq: Every day | ORAL | 0 refills | Status: DC

## 2019-05-22 MED ORDER — RISPERIDONE 1 MG PO TABS: 1.0000 mg | ORAL_TABLET | Freq: Every day | ORAL | 0 refills | Status: DC

## 2019-05-22 NOTE — Telephone Encounter (Signed)
Refills submitted.  

## 2019-05-22 NOTE — Telephone Encounter (Signed)
Pt requesting refills on her Lamictal, [place] with [facility]. Next appt scheduled for 01/13.

## 2019-05-24 ENCOUNTER — Ambulatory Visit: Payer: Medicare HMO | Admitting: Physician Assistant

## 2019-05-29 ENCOUNTER — Other Ambulatory Visit: Payer: Self-pay | Admitting: *Deleted

## 2019-05-29 MED ORDER — DILTIAZEM HCL ER COATED BEADS 120 MG PO CP24: 120.0000 mg | ORAL_CAPSULE | Freq: Every day | ORAL | 0 refills | Status: DC

## 2019-06-21 ENCOUNTER — Ambulatory Visit: Payer: Medicare HMO | Admitting: Physician Assistant

## 2019-07-08 LAB — COLOGUARD: COLOGUARD: POSITIVE — AB

## 2019-07-24 ENCOUNTER — Ambulatory Visit (INDEPENDENT_AMBULATORY_CARE_PROVIDER_SITE_OTHER): Payer: Medicare HMO | Admitting: Physician Assistant

## 2019-07-24 ENCOUNTER — Encounter: Payer: Self-pay | Admitting: Physician Assistant

## 2019-07-24 DIAGNOSIS — F411 Generalized anxiety disorder: Secondary | ICD-10-CM | POA: Diagnosis not present

## 2019-07-24 DIAGNOSIS — F319 Bipolar disorder, unspecified: Secondary | ICD-10-CM

## 2019-07-24 MED ORDER — LAMOTRIGINE 100 MG PO TABS: 100.0000 mg | ORAL_TABLET | Freq: Every day | ORAL | 1 refills | Status: DC

## 2019-07-24 NOTE — Progress Notes (Addendum)
[facility]  Patient ID: DELENA WHITCOMB,  MRN: [id number]  PCP: Jani Gravel, MD  Date of Evaluation: 07/24/2019 Time spent:20 minutes  Chief Complaint:  Chief Complaint    Depression; Medication Refill     Virtual Visit via Video Note  I connected with Roselle Locus on 07/24/19 at 11:00 AM EST by a video enabled telemedicine application and verified that I am speaking with the correct person using two identifiers. We attempted to do meeting via WebEx but patient was unable to get access meeting, so we had visit on phone.    Location: Patient: home Provider: office   I discussed the limitations of evaluation and management by telemedicine and the availability of in person appointments. The patient expressed understanding and agreed to proceed.    The patient was advised to call back or seek an in-person evaluation if the symptoms worsen or if the condition fails to improve as anticipated.  I provided 20 minutes of non-face-to-face time during this encounter.   Donnal Moat, PA-C  HISTORY/CURRENT STATUS: HPI For routine med check.   Doing great!  "I'm so happy w/ this cocktail I'm on."   Patient denies loss of interest in usual activities and is able to enjoy things.  Denies decreased energy or motivation.  Appetite has not changed.  No extreme sadness, tearfulness, or feelings of hopelessness.  Denies any changes in concentration, making decisions or remembering things.  Denies suicidal or homicidal thoughts.  Patient denies increased energy with decreased need for sleep, no increased talkativeness, no racing thoughts, no impulsivity or risky behaviors, no increased spending, no increased libido, no grandiosity.  Anxiety is controlled.  Sleeps good most of the time. Not working since 2010, on disability.  Individual Medical History/ Review of Systems: Changes? :No    Past medications for mental health diagnoses include: Depakote, Lamictal, Abilify,  Xanax, Celexa, Risperdal, lithium  Allergies: Augmentin [amoxicillin-pot clavulanate] and Doxycycline hyclate  Current Medications:  Current Outpatient Medications:  .  Buprenorphine HCl-Naloxone HCl (ZUBSOLV) 1.4-0.36 MG SUBL, Place 2 tablets under the tongue every morning. , Disp: , Rfl:  .  citalopram (CELEXA) 20 MG tablet, Take 1 tablet (20 mg total) by mouth at bedtime., Disp: 90 tablet, Rfl: 0 .  clotrimazole-betamethasone (LOTRISONE) cream, APPLY TOPICALLY TWICE DAILY, Disp: 120 g, Rfl: 1 .  diltiazem (CARDIZEM CD) 120 MG 24 hr capsule, Take 1 capsule (120 mg total) by mouth daily., Disp: 15 capsule, Rfl: 0 .  ezetimibe (ZETIA) 10 MG tablet, Take 10 mg by mouth daily., Disp: , Rfl:  .  gabapentin (NEURONTIN) 100 MG capsule, Take 1 capsule (100 mg total) by mouth at bedtime., Disp: 90 capsule, Rfl: 0 .  lamoTRIgine (LAMICTAL) 100 MG tablet, Take 1 tablet (100 mg total) by mouth daily., Disp: 90 tablet, Rfl: 1 .  ondansetron (ZOFRAN) 4 MG tablet, Take 4 mg by mouth every 6 (six) hours as needed for nausea/vomiting., Disp: , Rfl:  .  risperiDONE (RISPERDAL) 1 MG tablet, Take 1 tablet (1 mg total) by mouth at bedtime., Disp: 90 tablet, Rfl: 0 Medication Side Effects: none  Family Medical/ Social History: Changes? No  MENTAL HEALTH EXAM:  There were no vitals taken for this visit.There is no height or weight on file to calculate BMI.  General Appearance: unable to assess  Eye Contact:  unable to assess  Speech:  Normal Rate  Volume:  Normal  Mood:  Euthymic  Affect:  unable to assess  Thought Process:  Goal  Directed and Descriptions of Associations: Intact  Orientation:  Full (Time, Place, and Person)  Thought Content: Logical   Suicidal Thoughts:  No  Homicidal Thoughts:  No  Memory:  WNL  Judgement:  Good  Insight:  Good  Psychomotor Activity:  unable to assess  Concentration:  Concentration: Good and Attention Span: Good  Recall:  Good  Fund of Knowledge: Good  Language:  Good  Assets:  Desire for Improvement  ADL's:  Intact  Cognition: WNL  Prognosis:  Good    DIAGNOSES:    ICD-10-CM   1. Bipolar I disorder ([place])  F31.9   2. Generalized anxiety disorder  F41.1     Receiving Psychotherapy: No    RECOMMENDATIONS:  I spent 20 minutes w/ her. PDMP reviewed. Continue Celexa 20 mg qd. Continue Gabapentin 100 mg qhs. Continue Lamictal 100 mg, 1 qhs. Continue Risperdal 1 mg, 1 qhs.  She had labs drawn recently through her PCP.  She will have those sent to me. Return in 9 months.  Donnal Moat, PA-C

## 2019-08-01 ENCOUNTER — Telehealth: Payer: Self-pay | Admitting: Physician Assistant

## 2019-08-01 NOTE — Telephone Encounter (Signed)
Pt is needing a call back to go over her medications.

## 2019-08-01 NOTE — Telephone Encounter (Signed)
Left message to call back  

## 2019-08-03 NOTE — Telephone Encounter (Signed)
I left a message on the patient's voicemail for her to call back to discuss labs.

## 2019-08-21 ENCOUNTER — Telehealth: Payer: Self-pay | Admitting: Physician Assistant

## 2019-08-21 NOTE — Telephone Encounter (Signed)
I didn't call her about her labs.  I got them several weeks back and there wasn't anything of concern.

## 2019-08-21 NOTE — Telephone Encounter (Signed)
Pt is requesting a call back to discuss her recent lab results.

## 2019-09-21 ENCOUNTER — Other Ambulatory Visit: Payer: Self-pay | Admitting: Physician Assistant

## 2019-10-31 ENCOUNTER — Other Ambulatory Visit: Payer: Self-pay | Admitting: Physician Assistant

## 2020-04-01 NOTE — Progress Notes (Signed)
HPI: Follow-up atrial fibrillation. Patient presentedJanuary 2019 with new onset atrial fibrillation. She converted to sinus rhythm overnight and was treated with Cardizem and apixaban. Echocardiogram February 2019 showed normal LV function and mild left atrial enlargement. Since last seen  she has dyspnea on exertion but no orthopnea, PND, pedal edema, chest pain or syncope.  Current Outpatient Medications  Medication Sig Dispense Refill  . Buprenorphine HCl-Naloxone HCl (ZUBSOLV) 1.4-0.36 MG SUBL Place 2 tablets under the tongue every morning.     . citalopram (CELEXA) 20 MG tablet TAKE 1 TABLET AT BEDTIME 90 tablet 2  . clotrimazole-betamethasone (LOTRISONE) cream APPLY TOPICALLY TWICE DAILY 120 g 1  . diltiazem (CARDIZEM CD) 120 MG 24 hr capsule Take 1 capsule (120 mg total) by mouth daily. 15 capsule 0  . ezetimibe (ZETIA) 10 MG tablet Take 10 mg by mouth daily.    Marland Kitchen gabapentin (NEURONTIN) 100 MG capsule TAKE 1 CAPSULE (100 MG TOTAL) BY MOUTH AT BEDTIME. 90 capsule 1  . lamoTRIgine (LAMICTAL) 100 MG tablet Take 1 tablet (100 mg total) by mouth daily. 90 tablet 1  . ondansetron (ZOFRAN) 4 MG tablet Take 4 mg by mouth every 6 (six) hours as needed for nausea/vomiting.    . risperiDONE (RISPERDAL) 1 MG tablet TAKE 1 TABLET (1 MG TOTAL) BY MOUTH AT BEDTIME. 90 tablet 2   No current facility-administered medications for this visit.     Past Medical History:  Diagnosis Date  . DEGENERATIVE JOINT DISEASE 11/12/2006  . DISORDER, BIPOLAR NOS 02/28/2007  . GERD 11/12/2006    Past Surgical History:  Procedure Laterality Date  . ABDOMINAL HYSTERECTOMY    . BREAST SURGERY     reduction  . KNEE ARTHROSCOPY    . TONSILLECTOMY      Social History   Socioeconomic History  . Marital status: Married    Spouse name: Not on file  . Number of children: Not on file  . Years of education: Not on file  . Highest education level: Not on file  Occupational History  . Not on file   Tobacco Use  . Smoking status: Current Every Day Smoker    Packs/day: 2.00    Types: Cigarettes  . Smokeless tobacco: Never Used  Substance and Sexual Activity  . Alcohol use: No  . Drug use: Not Currently    Frequency: 7.0 times per week    Types: Marijuana    Comment: daily marijuana use  . Sexual activity: Not on file  Other Topics Concern  . Not on file  Social History Narrative  . Not on file   Social Determinants of Health   Financial Resource Strain:   . Difficulty of Paying Living Expenses: Not on file  Food Insecurity:   . Worried About Charity fundraiser in the Last Year: Not on file  . Ran Out of Food in the Last Year: Not on file  Transportation Needs:   . Lack of Transportation (Medical): Not on file  . Lack of Transportation (Non-Medical): Not on file  Physical Activity:   . Days of Exercise per Week: Not on file  . Minutes of Exercise per Session: Not on file  Stress:   . Feeling of Stress : Not on file  Social Connections:   . Frequency of Communication with Friends and Family: Not on file  . Frequency of Social Gatherings with Friends and Family: Not on file  . Attends Religious Services: Not on file  . Active  Member of Clubs or Organizations: Not on file  . Attends Archivist Meetings: Not on file  . Marital Status: Not on file  Intimate Partner Violence:   . Fear of Current or Ex-Partner: Not on file  . Emotionally Abused: Not on file  . Physically Abused: Not on file  . Sexually Abused: Not on file    Family History  Problem Relation Age of Onset  . Dementia Mother   . Congestive Heart Failure Father     ROS: no fevers or chills, productive cough, hemoptysis, dysphasia, odynophagia, melena, hematochezia, dysuria, hematuria, rash, seizure activity, orthopnea, PND, pedal edema, claudication. Remaining systems are negative.  Physical Exam: Well-developed well-nourished in no acute distress.  Skin is warm and dry.  HEENT is normal.   Neck is supple.  Chest is clear to auscultation with normal expansion.  Cardiovascular exam is regular rate and rhythm.  Abdominal exam nontender or distended. No masses palpated. Extremities show no edema. neuro grossly intact  ECG-normal sinus rhythm, no ST changes.  Personally reviewed  A/P  1 paroxysmal atrial fibrillation-patient is in sinus rhythm today.  Continue Cardizem for rate control if atrial fibrillation recurs.  She is not on anticoagulation as her CHADSvasc is 1 for female sex.  2 tobacco abuse-patient counseled on discontinuing.  3 history of bipolar disorder  4 history of elevated blood pressure-blood pressure normal today.  5 hyperlipidemia-patient is on Zetia.  Check lipids and liver.  Kirk Ruths, MD

## 2020-04-05 ENCOUNTER — Ambulatory Visit: Payer: Medicare HMO | Admitting: Cardiology

## 2020-04-05 ENCOUNTER — Other Ambulatory Visit: Payer: Self-pay

## 2020-04-05 ENCOUNTER — Encounter: Payer: Self-pay | Admitting: Cardiology

## 2020-04-05 VITALS — BP 117/72 | HR 73 | Temp 97.7°F | Ht 63.0 in | Wt 167.2 lb

## 2020-04-05 DIAGNOSIS — E7800 Pure hypercholesterolemia, unspecified: Secondary | ICD-10-CM | POA: Diagnosis not present

## 2020-04-05 DIAGNOSIS — Z720 Tobacco use: Secondary | ICD-10-CM | POA: Diagnosis not present

## 2020-04-05 DIAGNOSIS — I480 Paroxysmal atrial fibrillation: Secondary | ICD-10-CM

## 2020-04-05 NOTE — Patient Instructions (Signed)
  Lab Work: Your physician recommends that you have labs today If you have labs (blood work) drawn today and your tests are completely normal, you will receive your results only by: Marland Kitchen MyChart Message (if you have MyChart) OR . A paper copy in the mail If you have any lab test that is abnormal or we need to change your treatment, we will call you to review the results.  Follow-Up: At [facility], you and your health needs are our priority.  As part of our continuing mission to provide you with exceptional heart care, we have created designated Provider Care Teams.  These Care Teams include your primary Cardiologist (physician) and Advanced Practice Providers (APPs -  Physician Assistants and Nurse Practitioners) who all work together to provide you with the care you need, when you need it.  We recommend signing up for the patient portal called "MyChart".  Sign up information is provided on this After Visit Summary.  MyChart is used to connect with patients for Virtual Visits (Telemedicine).  Patients are able to view lab/test results, encounter notes, upcoming appointments, etc.  Non-urgent messages can be sent to your provider as well.   To learn more about what you can do with MyChart, go to [email or web address].    Your next appointment:   12 month(s)  The format for your next appointment:   In Person  Provider:   Kirk Ruths, MD

## 2020-04-12 ENCOUNTER — Other Ambulatory Visit: Payer: Self-pay | Admitting: Physician Assistant

## 2020-04-23 ENCOUNTER — Ambulatory Visit (INDEPENDENT_AMBULATORY_CARE_PROVIDER_SITE_OTHER): Payer: Medicare HMO | Admitting: Physician Assistant

## 2020-04-23 ENCOUNTER — Other Ambulatory Visit: Payer: Self-pay

## 2020-04-23 ENCOUNTER — Encounter: Payer: Self-pay | Admitting: Physician Assistant

## 2020-04-23 DIAGNOSIS — F411 Generalized anxiety disorder: Secondary | ICD-10-CM

## 2020-04-23 DIAGNOSIS — F319 Bipolar disorder, unspecified: Secondary | ICD-10-CM | POA: Diagnosis not present

## 2020-04-23 DIAGNOSIS — Z79899 Other long term (current) drug therapy: Secondary | ICD-10-CM

## 2020-04-23 NOTE — Progress Notes (Signed)
[facility]  Patient ID: Amanda [name],  MRN: [id number]  PCP: Jani Gravel, MD  Date of Evaluation: 04/23/2020 Time spent:30 minutes  Chief Complaint:  Chief Complaint    Medication Refill      HISTORY/CURRENT STATUS: HPI For routine med check.   Doing great!  She is very happy with her medications.  She has not had any problems from the bipolar disorder in years.  She is able to enjoy things.  Energy and motivation are good.  She eats well and has had no increased or decreased weight. Denies any changes in concentration, making decisions or remembering things.  Denies suicidal or homicidal thoughts.  Patient denies increased energy with decreased need for sleep, no increased talkativeness, no racing thoughts, no impulsivity or risky behaviors, no increased spending, no increased libido, no paranoia, no grandiosity, no hallucinations.  Anxiety is controlled.  Sleeps good most of the time. Not working since 2010, on disability.  Her husband had hernia surgery a couple of weeks ago and she is helping him right now as he recovers.  Other than that, is not having any increased stressors.  Individual Medical History/ Review of Systems: Changes? :No    Past medications for mental health diagnoses include: Depakote, Lamictal, Abilify, Xanax, Celexa, Risperdal, lithium  Allergies: Augmentin [amoxicillin-pot clavulanate] and Doxycycline hyclate  Current Medications:  Current Outpatient Medications:  .  Buprenorphine HCl-Naloxone HCl (ZUBSOLV) 1.4-0.36 MG SUBL, Place 2 tablets under the tongue every morning. , Disp: , Rfl:  .  citalopram (CELEXA) 20 MG tablet, TAKE 1 TABLET AT BEDTIME, Disp: 90 tablet, Rfl: 2 .  clotrimazole-betamethasone (LOTRISONE) cream, APPLY TOPICALLY TWICE DAILY, Disp: 120 g, Rfl: 1 .  diltiazem (CARDIZEM CD) 120 MG 24 hr capsule, Take 1 capsule (120 mg total) by mouth daily., Disp: 15 capsule, Rfl: 0 .  ezetimibe (ZETIA) 10 MG tablet, Take 10 mg  by mouth daily., Disp: , Rfl:  .  gabapentin (NEURONTIN) 100 MG capsule, TAKE 1 CAPSULE AT BEDTIME, Disp: 90 capsule, Rfl: 0 .  ibuprofen (ADVIL) 800 MG tablet, ibuprofen 800 mg tablet  TAKE 1 TABLET BY MOUTH EVERY 6 HOURS AS NEEDED FOR PAIN, Disp: , Rfl:  .  lamoTRIgine (LAMICTAL) 100 MG tablet, TAKE 1 TABLET EVERY DAY, Disp: 90 tablet, Rfl: 0 .  ondansetron (ZOFRAN) 4 MG tablet, Take 4 mg by mouth every 6 (six) hours as needed for nausea/vomiting., Disp: , Rfl:  .  risperiDONE (RISPERDAL) 1 MG tablet, TAKE 1 TABLET (1 MG TOTAL) BY MOUTH AT BEDTIME., Disp: 90 tablet, Rfl: 0 Medication Side Effects: none  Family Medical/ Social History: Changes? No  MENTAL HEALTH EXAM:  There were no vitals taken for this visit.There is no height or weight on file to calculate BMI.  General Appearance: Casual, Neat and Well Groomed  Eye Contact:  Good  Speech:  Normal Rate  Volume:  Normal  Mood:  Euthymic  Affect:  Appropriate  Thought Process:  Goal Directed and Descriptions of Associations: Intact  Orientation:  Full (Time, Place, and Person)  Thought Content: Logical   Suicidal Thoughts:  No  Homicidal Thoughts:  No  Memory:  WNL  Judgement:  Good  Insight:  Good  Psychomotor Activity:  Normal  Concentration:  Concentration: Good and Attention Span: Good  Recall:  Good  Fund of Knowledge: Good  Language: Good  Assets:  Desire for Improvement  ADL's:  Intact  Cognition: WNL  Prognosis:  Good    DIAGNOSES:  ICD-10-CM   1. Bipolar I disorder ([place])  F31.9   2. Encounter for long-term (current) use of medications  J61.164 Basic metabolic panel    Hemoglobin A1C  3. Generalized anxiety disorder  F41.1     Receiving Psychotherapy: No    RECOMMENDATIONS:  PDMP reviewed. I provided 30 minutes of face-to-face time during this encounter, discussing compliance with labs.  Her PCP has already ordered a lipid panel and LFTs.  Because of the Risperdal she also needs a BMP and hemoglobin  A1c. I am glad to hear she is doing so well! Continue Celexa 20 mg qd. Continue Gabapentin 100 mg qhs. Continue Lamictal 100 mg, 1 qhs. Continue Risperdal 1 mg, 1 qhs.  Labs ordered as above. Return in 9 months.  Donnal Moat, PA-C

## 2020-05-09 ENCOUNTER — Encounter: Payer: Self-pay | Admitting: *Deleted

## 2020-05-10 MED ORDER — DILTIAZEM HCL ER COATED BEADS 120 MG PO CP24: 120.0000 mg | ORAL_CAPSULE | Freq: Every day | ORAL | 3 refills | Status: DC

## 2020-07-03 ENCOUNTER — Telehealth: Payer: Self-pay | Admitting: Physician Assistant

## 2020-07-03 ENCOUNTER — Other Ambulatory Visit: Payer: Self-pay

## 2020-07-03 MED ORDER — LAMOTRIGINE 100 MG PO TABS: 100.0000 mg | ORAL_TABLET | Freq: Every day | ORAL | 2 refills | Status: DC

## 2020-07-03 MED ORDER — CITALOPRAM HYDROBROMIDE 20 MG PO TABS: 20.0000 mg | ORAL_TABLET | Freq: Every day | ORAL | 2 refills | Status: DC

## 2020-07-03 MED ORDER — GABAPENTIN 100 MG PO CAPS: 100.0000 mg | ORAL_CAPSULE | Freq: Every day | ORAL | 2 refills | Status: DC

## 2020-07-03 NOTE — Telephone Encounter (Signed)
Patient lm requesting refills on the Celexa, Lamictal, and Gabapentin. Patient last seen 04/23/20. Follow up appt scheduled for 8/17.

## 2020-07-03 NOTE — Telephone Encounter (Signed)
Rtc to patient to confirm pharmacy. She said [facility] order. Rx's sent

## 2020-09-02 ENCOUNTER — Other Ambulatory Visit: Payer: Self-pay

## 2020-09-02 ENCOUNTER — Telehealth: Payer: Self-pay | Admitting: Physician Assistant

## 2020-09-02 MED ORDER — RISPERIDONE 1 MG PO TABS: 1.0000 mg | ORAL_TABLET | Freq: Every day | ORAL | 1 refills | Status: DC

## 2020-09-02 NOTE — Telephone Encounter (Signed)
Rx sent 

## 2020-09-02 NOTE — Telephone Encounter (Signed)
Amanda [name] called in today needing refill for Risperdal 1mg . Has appt 8/17. [facility]

## 2020-10-03 NOTE — Telephone Encounter (Signed)
This RN called patient at [phone], patient identity confirmed with name and date of birth. Patient states she is not currently having afib, but has had some trouble breathing. Pt reports she wants to "switch medication to Eliquis", but is not sure what medication she currently takes, she thinks it is diltiazem. This RN educated patient that Eliquis and diltiazem are for different uses, and that she would need to be seen to change medications. Patient reports she was not comfortable waiting for her appointment and needed a visit sooner. Pt scheduled to come in tomorrow at 3pm for a visit with Dr. Stanford Breed. Pt advised to go to emergency department if symptoms worsen or difficulty breathing increases between now and appointment. Pt verbalized understanding, all concerns addressed at this time.

## 2020-10-04 ENCOUNTER — Ambulatory Visit: Payer: Medicare HMO | Admitting: Cardiology

## 2020-10-04 ENCOUNTER — Other Ambulatory Visit: Payer: Self-pay

## 2020-10-04 ENCOUNTER — Encounter: Payer: Self-pay | Admitting: Cardiology

## 2020-10-04 VITALS — BP 124/74 | HR 63 | Ht 63.5 in | Wt 173.8 lb

## 2020-10-04 DIAGNOSIS — R0609 Other forms of dyspnea: Secondary | ICD-10-CM

## 2020-10-04 DIAGNOSIS — Z720 Tobacco use: Secondary | ICD-10-CM | POA: Diagnosis not present

## 2020-10-04 DIAGNOSIS — R072 Precordial pain: Secondary | ICD-10-CM

## 2020-10-04 DIAGNOSIS — I480 Paroxysmal atrial fibrillation: Secondary | ICD-10-CM | POA: Diagnosis not present

## 2020-10-04 DIAGNOSIS — R0600 Dyspnea, unspecified: Secondary | ICD-10-CM

## 2020-10-04 MED ORDER — ASPIRIN EC 81 MG PO TBEC: 81.0000 mg | DELAYED_RELEASE_TABLET | Freq: Every day | ORAL | 3 refills | Status: DC

## 2020-10-04 MED ORDER — METOPROLOL TARTRATE 50 MG PO TABS: ORAL_TABLET | ORAL | 0 refills | Status: DC

## 2020-10-04 NOTE — Patient Instructions (Signed)
Medication Instructions:   STOP ELIQUIS  START ASPIRIN 81 MG ONCE DAILY  *If you need a refill on your cardiac medications before your next appointment, please call your pharmacy*   Testing/Procedures:  Your physician has requested that you have an echocardiogram. Echocardiography is a painless test that uses sound waves to create images of your heart. It provides your doctor with information about the size and shape of your heart and how well your heart's chambers and valves are working. This procedure takes approximately one hour. There are no restrictions for this procedure.  [place]    Your cardiac CT will be scheduled at one of the below locations:   [facility] [address] [place], [place] [address] [phone]   If scheduled at [facility], please arrive at the [facility] main entrance (entrance A) of [facility] 30 minutes prior to test start time. Proceed to the [facility] Radiology Department (first floor) to check-in and test prep.   Please follow these instructions carefully (unless otherwise directed):   On the Night Before the Test: . Be sure to Drink plenty of water. . Do not consume any caffeinated/decaffeinated beverages or chocolate 12 hours prior to your test. . Do not take any antihistamines 12 hours prior to your test.   On the Day of the Test: . Drink plenty of water until 1 hour prior to the test. . Do not eat any food 4 hours prior to the test. . You may take your regular medications prior to the test.  . Take metoprolol (Lopressor) 50 MG two hours prior to test. . HOLD Furosemide/Hydrochlorothiazide morning of the test. . FEMALES- please wear underwire-free bra if available        After the Test: . Drink plenty of water. . After receiving IV contrast, you may experience a mild flushed feeling. This is normal. . On occasion, you may experience a mild rash up to 24 hours after the test. This  is not dangerous. If this occurs, you can take Benadryl 25 mg and increase your fluid intake. . If you experience trouble breathing, this can be serious. If it is severe call 911 IMMEDIATELY. If it is mild, please call our office. . If you take any of these medications: Glipizide/Metformin, Avandament, Glucavance, please do not take 48 hours after completing test unless otherwise instructed.   Once we have confirmed authorization from your insurance company, we will call you to set up a date and time for your test. Based on how quickly your insurance processes prior authorizations requests, please allow up to 4 weeks to be contacted for scheduling your Cardiac CT appointment. Be advised that routine Cardiac CT appointments could be scheduled as many as 8 weeks after your provider has ordered it.  For non-scheduling related questions, please contact the cardiac imaging nurse navigator should you have any questions/concerns: Marchia Bond, Cardiac Imaging Nurse Navigator Gordy Clement, Cardiac Imaging Nurse Navigator [facility] Direct Office Dial: [phone]   For scheduling needs, including cancellations and rescheduling, please call [place], [phone].     Follow-Up: At [facility], you and your health needs are our priority.  As part of our continuing mission to provide you with exceptional heart care, we have created designated Provider Care Teams.  These Care Teams include your primary Cardiologist (physician) and Advanced Practice Providers (APPs -  Physician Assistants and Nurse Practitioners) who all work together to provide you with the care  you need, when you need it.  We recommend signing up for the patient portal called "MyChart".  Sign up information is provided on this After Visit Summary.  MyChart is used to connect with patients for Virtual Visits (Telemedicine).  Patients are able to view lab/test results, encounter notes, upcoming appointments,  etc.  Non-urgent messages can be sent to your provider as well.   To learn more about what you can do with MyChart, go to [email or web address].    Your next appointment:   6-8 week(s)  The format for your next appointment:   In Person  Provider:   You will see one of the following Advanced Practice Providers on your designated Care Team:    Sande Rives, PA-C  Coletta Memos, FNP  Then, Kirk Ruths, MD will plan to see you again in 4-6 month(s).

## 2020-10-04 NOTE — Progress Notes (Signed)
HPI: Follow-up atrial fibrillation. Patient presentedJanuary 2019 with new onset atrial fibrillation. She converted to sinus rhythm overnight and was treated with Cardizem and apixaban. Echocardiogram February 2019 showed normal LV function and mild left atrial enlargement.  Patient contacted the office with dyspnea and was added to my schedule today. Since last seenshe notes increased dyspnea on exertion.  No orthopnea, PND, pedal edema or syncope.  She notices occasional chest tightness.  It is not exertional.  It increases with inspiration.  Current Outpatient Medications  Medication Sig Dispense Refill  . apixaban (ELIQUIS) 5 MG TABS tablet Eliquis 5 mg tablet   1 tablet every day by oral route.    . Buprenorphine HCl-Naloxone HCl 1.4-0.36 MG SUBL Place 2 tablets under the tongue every morning.     . citalopram (CELEXA) 20 MG tablet Take 1 tablet (20 mg total) by mouth at bedtime. 90 tablet 2  . clotrimazole-betamethasone (LOTRISONE) cream APPLY TOPICALLY TWICE DAILY 120 g 1  . diltiazem (CARDIZEM CD) 120 MG 24 hr capsule Take 1 capsule (120 mg total) by mouth daily. 90 capsule 3  . ezetimibe (ZETIA) 10 MG tablet Take 10 mg by mouth daily.    Marland Kitchen gabapentin (NEURONTIN) 100 MG capsule Take 1 capsule (100 mg total) by mouth at bedtime. 90 capsule 2  . ibuprofen (ADVIL) 800 MG tablet ibuprofen 800 mg tablet  TAKE 1 TABLET BY MOUTH EVERY 6 HOURS AS NEEDED FOR PAIN    . lamoTRIgine (LAMICTAL) 100 MG tablet Take 1 tablet (100 mg total) by mouth daily. 90 tablet 2  . ondansetron (ZOFRAN) 4 MG tablet Take 4 mg by mouth every 6 (six) hours as needed for nausea/vomiting.    . risperiDONE (RISPERDAL) 1 MG tablet Take 1 tablet (1 mg total) by mouth at bedtime. 90 tablet 1  . ZOLMitriptan (ZOMIG PO) Take by mouth.     No current facility-administered medications for this visit.     Past Medical History:  Diagnosis Date  . DEGENERATIVE JOINT DISEASE 11/12/2006  . DISORDER, BIPOLAR NOS  02/28/2007  . GERD 11/12/2006    Past Surgical History:  Procedure Laterality Date  . ABDOMINAL HYSTERECTOMY    . BREAST SURGERY     reduction  . KNEE ARTHROSCOPY    . TONSILLECTOMY      Social History   Socioeconomic History  . Marital status: Married    Spouse name: Not on file  . Number of children: Not on file  . Years of education: Not on file  . Highest education level: Not on file  Occupational History  . Not on file  Tobacco Use  . Smoking status: Current Every Day Smoker    Packs/day: 1.50    Types: Cigarettes  . Smokeless tobacco: Never Used  Substance and Sexual Activity  . Alcohol use: No  . Drug use: Not Currently    Frequency: 7.0 times per week    Types: Marijuana    Comment: daily marijuana use  . Sexual activity: Not on file  Other Topics Concern  . Not on file  Social History Narrative  . Not on file   Social Determinants of Health   Financial Resource Strain: Not on file  Food Insecurity: Not on file  Transportation Needs: Not on file  Physical Activity: Not on file  Stress: Not on file  Social Connections: Not on file  Intimate Partner Violence: Not on file    Family History  Problem Relation Age of Onset  .  Dementia Mother   . Congestive Heart Failure Father     ROS: no fevers or chills, productive cough, hemoptysis, dysphasia, odynophagia, melena, hematochezia, dysuria, hematuria, rash, seizure activity, orthopnea, PND, pedal edema, claudication. Remaining systems are negative.  Physical Exam: Well-developed well-nourished in no acute distress.  Skin is warm and dry.  HEENT is normal.  Neck is supple.  Chest is clear to auscultation with normal expansion.  Cardiovascular exam is regular rate and rhythm.  Abdominal exam nontender or distended. No masses palpated. Extremities show no edema. neuro grossly intact  ECG-sinus rhythm at a rate of 63, no ST changes.  Personally reviewed  A/P  1 paroxysmal atrial fibrillation-patient  remains in sinus rhythm.  We will continue Cardizem at present dose.  CHA2DS2-VASc is 1 for female sex.  We will therefore not anticoagulate.  We will treat with aspirin 81 mg daily.  When she turns 67 she will need apixaban.  2 dyspnea-etiology unclear.  I am concerned that lung disease could be contributing.  However we will plan an echocardiogram to rule out LV dysfunction.  3 chest tightness-likely related to pulmonary disease.  However I will arrange CTA to rule out coronary disease.  4 tobacco abuse-patient again counseled on discontinuing.  5 hyperlipidemia-continue Zetia.  Kirk Ruths, MD

## 2020-10-10 ENCOUNTER — Telehealth (HOSPITAL_COMMUNITY): Payer: Self-pay | Admitting: Emergency Medicine

## 2020-10-10 ENCOUNTER — Other Ambulatory Visit (HOSPITAL_COMMUNITY): Payer: Self-pay | Admitting: Emergency Medicine

## 2020-10-10 DIAGNOSIS — R0609 Other forms of dyspnea: Secondary | ICD-10-CM

## 2020-10-10 DIAGNOSIS — R0600 Dyspnea, unspecified: Secondary | ICD-10-CM

## 2020-10-10 DIAGNOSIS — R072 Precordial pain: Secondary | ICD-10-CM

## 2020-10-10 NOTE — Telephone Encounter (Signed)
Pt arrived to radiology registration with questions about labs prior to CCTA next week.   Looked in the patients chart and there are no orders for BMP, etc.   Per patients age (52) she would need a BMP prior to receiving contrast  Marchia Bond RN Navigator Cardiac Imaging [facility] [phone] Office  [phone] Cell

## 2020-10-10 NOTE — Telephone Encounter (Signed)
Spoke with pt, aware orders for bmp placed.

## 2020-10-16 ENCOUNTER — Ambulatory Visit (HOSPITAL_COMMUNITY): Payer: Medicare HMO

## 2020-10-17 ENCOUNTER — Telehealth (HOSPITAL_COMMUNITY): Payer: Self-pay | Admitting: *Deleted

## 2020-10-17 NOTE — Telephone Encounter (Signed)
Reaching out to patient to offer assistance regarding upcoming cardiac imaging study; pt verbalizes understanding of appt date/time, parking situation and where to check in, pre-test NPO status and medications ordered, and verified current allergies; name and call back number provided for further questions should they arise  [place] [facility] [phone] office [phone] cell  Pt states she will get labs today and will take 50mg  metoprolol tartrate 2 hours prior to cardiac CT scan.

## 2020-10-18 ENCOUNTER — Other Ambulatory Visit: Payer: Self-pay

## 2020-10-18 DIAGNOSIS — R072 Precordial pain: Secondary | ICD-10-CM

## 2020-10-19 LAB — BASIC METABOLIC PANEL
BUN/Creatinine Ratio: 22 (ref 12–28)
BUN: 18 mg/dL (ref 8–27)
CO2: 26 mmol/L (ref 20–29)
Calcium: 9.3 mg/dL (ref 8.7–10.3)
Chloride: 100 mmol/L (ref 96–106)
Creatinine, Ser: 0.83 mg/dL (ref 0.57–1.00)
Glucose: 85 mg/dL (ref 65–99)
Potassium: 4.8 mmol/L (ref 3.5–5.2)
Sodium: 140 mmol/L (ref 134–144)
eGFR: 80 mL/min/{1.73_m2} (ref >59)

## 2020-10-21 ENCOUNTER — Other Ambulatory Visit: Payer: Self-pay

## 2020-10-21 ENCOUNTER — Ambulatory Visit (HOSPITAL_COMMUNITY)
Admission: RE | Admit: 2020-10-21 | Discharge: 2020-10-21 | Disposition: A | Discharge status: Home / Self Care | Payer: Medicare HMO | Source: Ambulatory Visit | Attending: Cardiology | Admitting: Cardiology

## 2020-10-21 DIAGNOSIS — R072 Precordial pain: Secondary | ICD-10-CM | POA: Diagnosis present

## 2020-10-21 MED ORDER — NITROGLYCERIN 0.4 MG SL SUBL
SUBLINGUAL_TABLET | SUBLINGUAL | Status: AC
  Filled 2020-10-21: qty 2

## 2020-10-21 MED ORDER — NITROGLYCERIN 0.4 MG SL SUBL
0.8000 mg | SUBLINGUAL_TABLET | Freq: Once | SUBLINGUAL | Status: AC
  Administered 2020-10-21: 0.8 mg via SUBLINGUAL

## 2020-10-21 MED ORDER — IOHEXOL 350 MG/ML SOLN
100.0000 mL | Freq: Once | INTRAVENOUS | Status: AC | PRN
  Administered 2020-10-21: 100 mL via INTRAVENOUS

## 2020-10-25 ENCOUNTER — Encounter: Payer: Self-pay | Admitting: *Deleted

## 2020-10-29 ENCOUNTER — Other Ambulatory Visit (HOSPITAL_COMMUNITY): Payer: Medicare HMO

## 2020-11-11 NOTE — Progress Notes (Signed)
Cardiology Clinic Note   Patient Name: Amanda [name] Date of Encounter: 11/12/2020  Primary Care Provider:  Emelia Loron, NP Primary Cardiologist:  Kirk Ruths, MD  Patient Profile    Amanda [name] 65 year old female presents to the clinic today for follow-up evaluation of her atrial fibrillation.  Past Medical History    Past Medical History:  Diagnosis Date  . DEGENERATIVE JOINT DISEASE 11/12/2006  . DISORDER, BIPOLAR NOS 02/28/2007  . GERD 11/12/2006   Past Surgical History:  Procedure Laterality Date  . ABDOMINAL HYSTERECTOMY    . BREAST SURGERY     reduction  . KNEE ARTHROSCOPY    . TONSILLECTOMY      Allergies  Allergies  Allergen Reactions  . Augmentin [Amoxicillin-Pot Clavulanate] Other (See Comments)    Has patient had a PCN reaction causing immediate rash, facial/tongue/throat swelling, SOB or lightheadedness with hypotension: No Has patient had a PCN reaction causing severe rash involving mucus membranes or skin necrosis: No Has patient had a PCN reaction that required hospitalization: No Has patient had a PCN reaction occurring within the last 10 years: No If all of the above answers are "NO", then may proceed with Cephalosporin use.   . Sumatriptan Other (See Comments)  . Doxycycline Hyclate Hives and Rash    REACTION: rash / hives    History of Present Illness    Amanda [name] has a PMH of atrial fibrillation, GERD, DJD, tobacco abuse, and bipolar disorder.  She was seen 1/19 with new onset atrial fibrillation.  She converted to normal sinus rhythm overnight and was treated with Cardizem and apixaban therapy.  Echocardiogram 2/19 showed normal LVEF, and mild left atrial enlargement.  She was seen by Dr. Stanford Breed on 10/04/2020 after contacting the office with complaints of dyspnea.  She noted increased dyspnea with exertion.  She denied orthopnea, PND, lower extremity swelling, and syncope.  She reported occasional chest tightness.  Her  tightness was nonexertional.  Her tightness increased with inspiration.  An echocardiogram was ordered but has not yet been completed.  It is felt that her dyspnea may be related to lung disease.  Her EKG showed normal sinus rhythm with a rate of 63 no ST changes.  She presents the clinic today for follow-up evaluation states she has increase her physical activity.  She is now going to the pool more.  She reports that she also does yard work and walks regularly with a friend 3 times per week.  We reviewed her coronary CTA and she expressed understanding.  She was reassured that her dyspnea is not related to cardiac issues.  I will refer her to pulmonology.  We will give her the salty 6 diet sheet, have her maintain her physical activity, and follow-up in 3 months.  Today she denies chest pain, shortness of breath, lower extremity edema, fatigue, palpitations, melena, hematuria, hemoptysis, diaphoresis, weakness, presyncope, syncope, orthopnea, and PND.   Home Medications    Prior to Admission medications   Medication Sig Start Date End Date Taking? Authorizing Provider  aspirin EC 81 MG tablet Take 1 tablet (81 mg total) by mouth daily. Swallow whole. 10/04/20   Lelon Perla, MD  Buprenorphine HCl-Naloxone HCl 1.4-0.36 MG SUBL Place 2 tablets under the tongue every morning.     [provider]  citalopram (CELEXA) 20 MG tablet Take 1 tablet (20 mg total) by mouth at bedtime. 07/03/20   Donnal Moat T, PA-C  clotrimazole-betamethasone (LOTRISONE) cream APPLY TOPICALLY TWICE DAILY 05/27/11  Marletta Lor, MD  diltiazem (CARDIZEM CD) 120 MG 24 hr capsule Take 1 capsule (120 mg total) by mouth daily. 05/10/20   Lelon Perla, MD  ezetimibe (ZETIA) 10 MG tablet Take 10 mg by mouth daily.    [provider]  gabapentin (NEURONTIN) 100 MG capsule Take 1 capsule (100 mg total) by mouth at bedtime. 07/03/20   Donnal Moat T, PA-C  ibuprofen (ADVIL) 800 MG tablet ibuprofen  800 mg tablet  TAKE 1 TABLET BY MOUTH EVERY 6 HOURS AS NEEDED FOR PAIN    [provider]  lamoTRIgine (LAMICTAL) 100 MG tablet Take 1 tablet (100 mg total) by mouth daily. 07/03/20   Donnal Moat T, PA-C  metoprolol tartrate (LOPRESSOR) 50 MG tablet TAKE 2 HOURS PRIOR TO CT SCAN 10/04/20   Lelon Perla, MD  ondansetron (ZOFRAN) 4 MG tablet Take 4 mg by mouth every 6 (six) hours as needed for nausea/vomiting. 04/19/17   [provider]  risperiDONE (RISPERDAL) 1 MG tablet Take 1 tablet (1 mg total) by mouth at bedtime. 09/02/20   Donnal Moat T, PA-C  ZOLMitriptan (ZOMIG PO) Take by mouth.    [provider]    Family History    Family History  Problem Relation Age of Onset  . Dementia Mother   . Congestive Heart Failure Father    She indicated that her mother is alive. She indicated that her father is deceased.  Social History    Social History   Socioeconomic History  . Marital status: Married    Spouse name: Not on file  . Number of children: Not on file  . Years of education: Not on file  . Highest education level: Not on file  Occupational History  . Not on file  Tobacco Use  . Smoking status: Current Every Day Smoker    Packs/day: 1.50    Types: Cigarettes  . Smokeless tobacco: Never Used  Substance and Sexual Activity  . Alcohol use: No  . Drug use: Not Currently    Frequency: 7.0 times per week    Types: Marijuana    Comment: daily marijuana use  . Sexual activity: Not on file  Other Topics Concern  . Not on file  Social History Narrative  . Not on file   Social Determinants of Health   Financial Resource Strain: Not on file  Food Insecurity: Not on file  Transportation Needs: Not on file  Physical Activity: Not on file  Stress: Not on file  Social Connections: Not on file  Intimate Partner Violence: Not on file     Review of Systems    General:  No chills, fever, night sweats or weight changes.  Cardiovascular:  No  chest pain, edema, orthopnea, palpitations, paroxysmal nocturnal dyspnea. Dermatological: No rash, lesions/masses Respiratory: No cough, dyspnea Urologic: No hematuria, dysuria Abdominal:   No nausea, vomiting, diarrhea, bright red blood per rectum, melena, or hematemesis Neurologic:  No visual changes, wkns, changes in mental status. All other systems reviewed and are otherwise negative except as noted above.  Physical Exam    VS:  BP 132/64 (BP Location: Left Arm)   Pulse 66   Ht 5\' 4"  (1.626 m)   Wt 169 lb (76.7 kg)   SpO2 98%   BMI 29.01 kg/m  , BMI Body mass index is 29.01 kg/m. GEN: Well nourished, well developed, in no acute distress. HEENT: normal. Neck: Supple, no JVD, carotid bruits, or masses. Cardiac: RRR, no murmurs, rubs, or  gallops. No clubbing, cyanosis, edema.  Radials/DP/PT 2+ and equal bilaterally.  Respiratory:  Respirations regular and unlabored, clear to auscultation bilaterally. GI: Soft, nontender, nondistended, BS + x 4. MS: no deformity or atrophy. Skin: warm and dry, no rash. Neuro:  Strength and sensation are intact. Psych: Normal affect.  Accessory Clinical Findings    Recent Labs: 10/18/2020: BUN 18; Creatinine, Ser 0.83; Potassium 4.8; Sodium 140   Recent Lipid Panel    Component Value Date/Time   CHOL 238 (HH) 10/29/2006 1047   LDLDIRECT 143.7 10/29/2006 1047    ECG personally reviewed by me today-none today.  Echocardiogram 07/12/2017 Study Conclusions   - Left ventricle: The cavity size was normal. There was borderline  concentric hypertrophy. Systolic function was normal. The  estimated ejection fraction was in the range of 55% to 60%. Wall  motion was normal; there were no regional wall motion  abnormalities. Left ventricular diastolic function parameters  were normal.  - Left atrium: The atrium was mildly dilated.   Coronary CTA 10/21/2020 EXAM: Cardiac/Coronary CTA  TECHNIQUE: The patient was scanned on a [employer].  FINDINGS: A 100 kV prospective scan was triggered in the descending thoracic aorta at 111 HU's. Axial non-contrast 3 mm slices were carried out through the heart. The data set was analyzed on a dedicated work station and scored using the [place]. Gantry rotation speed was 250 msecs and collimation was .6 mm. 0.8 mg of sl NTG was given. The 3D data set was reconstructed in 5% intervals of the 35-75 % of the R-R cycle. Phases were analyzed on a dedicated work station using MPR, MIP and VRT modes. The patient received 80 cc of contrast.  Coronary Arteries:  Normal coronary origin.  Right dominance.  RCA is a large dominant artery that gives rise to PDA and PLA. There is no plaque.  Left main is a large artery that gives rise to LAD and LCX arteries.  LAD is a large vessel that has no plaque.  LCX is a non-dominant artery that gives rise to one large OM1 branch. There is no plaque.  Other findings:  Left Ventricle: Mild dilatation  Left Atrium: Mild enlargement  Pulmonary Veins: Normal configuration  Right Ventricle: Mild dilatation  Right Atrium: Mild enlargement  Cardiac valves: No calcifications  Thoracic aorta: Normal size  Pulmonary Arteries: Normal size  Systemic Veins: Normal drainage  Pericardium: Normal thickness  IMPRESSION: 1. Coronary calcium score of 0.  2. Normal coronary origin with right dominance.  3. No evidence of CAD.  CAD-RADS 0. No evidence of CAD (0%). Consider non-atherosclerotic causes of chest pain.   Electronically Signed   By: Oswaldo Milian MD   On: 10/21/2020 12:02  Assessment & Plan   1.  Dyspnea- reports continued dyspnea with exertion.  Has been a continuous smoker since 65 years of age.  Is now down to half pack per day. Increase physical activity as tolerated Heart healthy low-sodium diet Continue to monitor Refer to Pulmonology   Chest tightness- no further  episodes of chest tightness or chest discomfort.  Denies arm neck and back pain.  Denies exertional chest discomfort.  Coronary CTA showed a coronary calcium score of 0 and no CAD. Reassured that her chest tightness was not related to cardiac issues. Reviewed coronary CTA  Paroxysmal atrial fibrillation-heart rate today 66.  CHA2DS2-VASc score 1 for being female.  Will need apixaban at age 35. Continue aspirin Heart healthy low-sodium diet-salty 6 given Increase physical  activity as tolerated  Hyperlipidemia-LDL 146 on 06/21/20 Continue ezetimibe Follows with PCP  Tobacco abuse- not ready to stop smoking at this time.  Counseled on tobacco cessation Smoking cessation information discussed.  Disposition: Follow-up with Dr. Stanford Breed in 6 months.   Jossie Ng. Jonda Alanis NP-C    11/12/2020, 10:16 AM [place] [address] Office [phone] Fax [phone]  Notice: This dictation was prepared with Dragon dictation along with smaller phrase technology. Any transcriptional errors that result from this process are unintentional and may not be corrected upon review.  I spent 14 minutes examining this patient, reviewing medications, and using patient centered shared decision making involving her cardiac care.  Prior to her visit I spent greater than 20 minutes reviewing her past medical history,  medications, and prior cardiac tests.

## 2020-11-12 ENCOUNTER — Other Ambulatory Visit: Payer: Self-pay

## 2020-11-12 ENCOUNTER — Ambulatory Visit (INDEPENDENT_AMBULATORY_CARE_PROVIDER_SITE_OTHER): Payer: Medicare HMO | Admitting: General Practice

## 2020-11-12 ENCOUNTER — Encounter: Payer: Self-pay | Admitting: General Practice

## 2020-11-12 VITALS — BP 132/64 | HR 66 | Ht 64.0 in | Wt 169.0 lb

## 2020-11-12 DIAGNOSIS — R0600 Dyspnea, unspecified: Secondary | ICD-10-CM | POA: Diagnosis not present

## 2020-11-12 DIAGNOSIS — R072 Precordial pain: Secondary | ICD-10-CM | POA: Diagnosis not present

## 2020-11-12 DIAGNOSIS — R0609 Other forms of dyspnea: Secondary | ICD-10-CM

## 2020-11-12 DIAGNOSIS — E7800 Pure hypercholesterolemia, unspecified: Secondary | ICD-10-CM

## 2020-11-12 DIAGNOSIS — I480 Paroxysmal atrial fibrillation: Secondary | ICD-10-CM | POA: Diagnosis not present

## 2020-11-12 DIAGNOSIS — Z720 Tobacco use: Secondary | ICD-10-CM

## 2020-11-12 NOTE — Patient Instructions (Signed)
Medication Instructions:  The current medical regimen is effective;  continue present plan and medications as directed. Please refer to the Current Medication list given to you today.  *If you need a refill on your cardiac medications before your next appointment, please call your pharmacy*  Lab Work:   Testing/Procedures:  NONE    NONE  Special Instructions PLEASE READ AND FOLLOW SALTY 6-ATTACHED-1,800mg  daily  PLEASE MAINTAIN PHYSICAL ACTIVITY   Follow-Up: Your next appointment:  6 month(s) In Person with Kirk Ruths, MD OR IF UNAVAILABLE [place], FNP-C   At [facility], you and your health needs are our priority.  As part of our continuing mission to provide you with exceptional heart care, we have created designated Provider Care Teams.  These Care Teams include your primary Cardiologist (physician) and Advanced Practice Providers (APPs -  Physician Assistants and Nurse Practitioners) who all work together to provide you with the care you need, when you need it.            6 SALTY THINGS TO AVOID     1,800MG  DAILY

## 2020-11-18 ENCOUNTER — Other Ambulatory Visit: Payer: Self-pay

## 2020-11-18 ENCOUNTER — Ambulatory Visit (HOSPITAL_COMMUNITY): Payer: Medicare HMO | Attending: Cardiovascular Disease

## 2020-11-18 DIAGNOSIS — R0609 Other forms of dyspnea: Secondary | ICD-10-CM

## 2020-11-18 DIAGNOSIS — R0600 Dyspnea, unspecified: Secondary | ICD-10-CM

## 2020-11-18 LAB — ECHOCARDIOGRAM COMPLETE
Area-P 1/2: 3.03 cm2
Calc EF: 62.6 %
S' Lateral: 3.2 cm
Single Plane A2C EF: 56.7 %
Single Plane A4C EF: 69.1 %

## 2020-11-18 NOTE — Progress Notes (Signed)
Patient ID: Amanda [name], female   DOB: Jan 16, 1956, 65 y.o.   MRN: [id number]  Echocardiogram 2D Echocardiogram has been performed.  Amanda [name] 11/18/20

## 2020-11-25 ENCOUNTER — Encounter: Payer: Self-pay | Admitting: *Deleted

## 2021-01-22 ENCOUNTER — Ambulatory Visit: Payer: Medicare HMO | Admitting: Physician Assistant

## 2021-02-12 ENCOUNTER — Other Ambulatory Visit: Payer: Self-pay | Admitting: Physician Assistant

## 2021-02-13 NOTE — Telephone Encounter (Signed)
90 day ok?

## 2021-03-11 ENCOUNTER — Encounter: Payer: Self-pay | Admitting: Physician Assistant

## 2021-03-11 ENCOUNTER — Ambulatory Visit: Payer: Medicare HMO | Admitting: Physician Assistant

## 2021-03-11 ENCOUNTER — Other Ambulatory Visit: Payer: Self-pay

## 2021-03-11 DIAGNOSIS — F411 Generalized anxiety disorder: Secondary | ICD-10-CM | POA: Diagnosis not present

## 2021-03-11 DIAGNOSIS — F319 Bipolar disorder, unspecified: Secondary | ICD-10-CM | POA: Diagnosis not present

## 2021-03-11 DIAGNOSIS — F17200 Nicotine dependence, unspecified, uncomplicated: Secondary | ICD-10-CM

## 2021-03-11 MED ORDER — GABAPENTIN 100 MG PO CAPS: 100.0000 mg | ORAL_CAPSULE | Freq: Every day | ORAL | 2 refills | Status: DC

## 2021-03-11 MED ORDER — LAMOTRIGINE 100 MG PO TABS: 100.0000 mg | ORAL_TABLET | Freq: Every day | ORAL | 2 refills | Status: DC

## 2021-03-11 MED ORDER — CITALOPRAM HYDROBROMIDE 20 MG PO TABS: 20.0000 mg | ORAL_TABLET | Freq: Every day | ORAL | 2 refills | Status: DC

## 2021-03-11 MED ORDER — RISPERIDONE 1 MG PO TABS: 1.0000 mg | ORAL_TABLET | Freq: Every day | ORAL | 2 refills | Status: DC

## 2021-03-11 NOTE — Progress Notes (Signed)
[facility] Med Check  Patient ID: Amanda [name],  MRN: [id number]  PCP: Emelia Loron, NP  Date of Evaluation: 03/11/2021 Time spent:30 minutes  Chief Complaint:  Chief Complaint   Depression; Anxiety; Follow-up      HISTORY/CURRENT STATUS: HPI For routine med check.   She is doing very well.  She has been on the same medications for years and they are still very helpful with her mood. Patient denies loss of interest in usual activities and is able to enjoy things.  Denies decreased energy or motivation.  Appetite has not changed.  No extreme sadness, tearfulness, or feelings of hopelessness.  Denies any changes in concentration, making decisions or remembering things.  Sleeps well.  Unfortunately still smoking.  Not ready to quit.  Denies suicidal or homicidal thoughts.  Patient denies increased energy with decreased need for sleep, no increased talkativeness, no racing thoughts, no impulsivity or risky behaviors, no increased spending, no increased libido, no grandiosity, no increased irritability or anger, and no hallucinations.  Denies dizziness, syncope, seizures, numbness, tingling, tremor, tics, unsteady gait, slurred speech, confusion. Denies muscle or joint pain, stiffness, or dystonia.  Individual Medical History/ Review of Systems: Changes? :Yes    diagnosed with atrial fibrillation since the last visit.  Doing well.  Past medications for mental health diagnoses include: Depakote, Lamictal, Abilify, Xanax, Celexa, Risperdal, lithium  Allergies: Augmentin [amoxicillin-pot clavulanate], Sumatriptan, and Doxycycline hyclate  Current Medications:  Current Outpatient Medications:    Buprenorphine HCl-Naloxone HCl (ZUBSOLV SL), Place under the tongue., Disp: , Rfl:    clotrimazole-betamethasone (LOTRISONE) cream, APPLY TOPICALLY TWICE DAILY, Disp: 120 g, Rfl: 1   ezetimibe (ZETIA) 10 MG tablet, Take 10 mg by mouth daily., Disp: , Rfl:    ibuprofen (ADVIL) 800 MG  tablet, ibuprofen 800 mg tablet  TAKE 1 TABLET BY MOUTH EVERY 6 HOURS AS NEEDED FOR PAIN, Disp: , Rfl:    ondansetron (ZOFRAN) 4 MG tablet, Take 4 mg by mouth every 6 (six) hours as needed for nausea/vomiting., Disp: , Rfl:    aspirin EC 81 MG tablet, Take 1 tablet (81 mg total) by mouth daily. Swallow whole. (Patient not taking: Reported on 03/11/2021), Disp: 90 tablet, Rfl: 3   Buprenorphine HCl-Naloxone HCl 1.4-0.36 MG SUBL, Place 2 tablets under the tongue every morning. , Disp: , Rfl:    citalopram (CELEXA) 20 MG tablet, Take 1 tablet (20 mg total) by mouth at bedtime., Disp: 90 tablet, Rfl: 2   diltiazem (CARDIZEM CD) 120 MG 24 hr capsule, Take 1 capsule (120 mg total) by mouth daily. (Patient not taking: Reported on 03/11/2021), Disp: 90 capsule, Rfl: 3   gabapentin (NEURONTIN) 100 MG capsule, Take 1 capsule (100 mg total) by mouth at bedtime., Disp: 90 capsule, Rfl: 2   lamoTRIgine (LAMICTAL) 100 MG tablet, Take 1 tablet (100 mg total) by mouth daily., Disp: 90 tablet, Rfl: 2   metoprolol tartrate (LOPRESSOR) 50 MG tablet, TAKE 2 HOURS PRIOR TO CT SCAN, Disp: 1 tablet, Rfl: 0   risperiDONE (RISPERDAL) 1 MG tablet, Take 1 tablet (1 mg total) by mouth at bedtime., Disp: 90 tablet, Rfl: 2   ZOLMitriptan (ZOMIG PO), Take by mouth. (Patient not taking: Reported on 03/11/2021), Disp: , Rfl:  Medication Side Effects: none  Family Medical/ Social History: Changes? No  MENTAL HEALTH EXAM:  There were no vitals taken for this visit.There is no height or weight on file to calculate BMI.  General Appearance: Casual, Neat and Well Groomed  Eye Contact:  Good  Speech:  Normal Rate  Volume:  Normal  Mood:  Euthymic  Affect:  Appropriate  Thought Process:  Goal Directed and Descriptions of Associations: Circumstantial  Orientation:  Full (Time, Place, and Person)  Thought Content: Logical   Suicidal Thoughts:  No  Homicidal Thoughts:  No  Memory:  WNL  Judgement:  Good  Insight:  Good   Psychomotor Activity:  Normal  Concentration:  Concentration: Good and Attention Span: Good  Recall:  Good  Fund of Knowledge: Good  Language: Good  Assets:  Desire for Improvement  ADL's:  Intact  Cognition: WNL  Prognosis:  Good    DIAGNOSES:    ICD-10-CM   1. Bipolar I disorder ([place])  F31.9     2. Generalized anxiety disorder  F41.1     3. Smoker  F17.200        Receiving Psychotherapy: No    RECOMMENDATIONS:  PDMP reviewed. [place] 02/14/2021 I provided 30 minutes of face to face time during this encounter, including time spent before and after the visit in records review, medical decision making, and charting.  Smoking cessation was discussed.  She is not ready to quit. She is doing really well so no changes in medications are necessary. Continue Celexa 20 mg qd. Continue Gabapentin 100 mg qhs. Continue Lamictal 100 mg, 1 qhs. Continue Risperdal 1 mg, 1 qhs.  She had labs drawn at [facility] yesterday.  She will sign a release of information which will be sent to them for her labs. Return in 9 months.  Donnal Moat, PA-C

## 2021-03-28 ENCOUNTER — Other Ambulatory Visit: Payer: Self-pay | Admitting: Physician Assistant

## 2021-04-27 ENCOUNTER — Other Ambulatory Visit: Payer: Self-pay | Admitting: Cardiology

## 2021-05-08 NOTE — Progress Notes (Signed)
[id number] atrial fibrillation.  Patient presented January 2019 with new onset atrial fibrillation.  She converted to sinus rhythm overnight and was treated with Cardizem and apixaban. Echocardiogram June 2022 showed ejection fraction 60 to 65%.  Coronary CTA May 2022 showed calcium score 0 and no coronary disease.  There was note of aortic atherosclerosis and emphysema.  Since last seen she has some dyspnea on exertion unchanged.  No orthopnea, PND, pedal edema, chest pain, palpitations or syncope.  Current Outpatient Medications  Medication Sig Dispense Refill   Buprenorphine HCl-Naloxone HCl 1.4-0.36 MG SUBL Place 2 tablets under the tongue every morning.      citalopram (CELEXA) 20 MG tablet TAKE 1 TABLET (20 MG TOTAL) BY MOUTH AT BEDTIME. 90 tablet 2   clotrimazole-betamethasone (LOTRISONE) cream APPLY TOPICALLY TWICE DAILY 120 g 1   diltiazem (CARDIZEM CD) 120 MG 24 hr capsule TAKE 1 CAPSULE (120 MG TOTAL) BY MOUTH DAILY. 90 capsule 3   ezetimibe (ZETIA) 10 MG tablet Take 10 mg by mouth daily.     gabapentin (NEURONTIN) 100 MG capsule Take 1 capsule (100 mg total) by mouth at bedtime. 90 capsule 2   ibuprofen (ADVIL) 800 MG tablet ibuprofen 800 mg tablet  TAKE 1 TABLET BY MOUTH EVERY 6 HOURS AS NEEDED FOR PAIN     lamoTRIgine (LAMICTAL) 100 MG tablet TAKE 1 TABLET (100 MG TOTAL) BY MOUTH DAILY. 90 tablet 2   risperiDONE (RISPERDAL) 1 MG tablet Take 1 tablet (1 mg total) by mouth at bedtime. 90 tablet 2   aspirin EC 81 MG tablet Take 1 tablet (81 mg total) by mouth daily. Swallow whole. (Patient not taking: Reported on 03/11/2021) 90 tablet 3   Buprenorphine HCl-Naloxone HCl (ZUBSOLV SL) Place under the tongue. (Patient not taking: Reported on 05/14/2021)     metoprolol tartrate (LOPRESSOR) 50 MG tablet TAKE 2 HOURS PRIOR TO CT SCAN (Patient not taking: Reported on 05/14/2021) 1 tablet 0   ondansetron (ZOFRAN) 4 MG tablet Take 4 mg by mouth every 6 (six) hours as needed for  nausea/vomiting. (Patient not taking: Reported on 05/14/2021)     ZOLMitriptan (ZOMIG PO) Take by mouth. (Patient not taking: Reported on 03/11/2021)     No current facility-administered medications for this visit.     Past Medical History:  Diagnosis Date   DEGENERATIVE JOINT DISEASE 11/12/2006   DISORDER, BIPOLAR NOS 02/28/2007   GERD 11/12/2006    Past Surgical History:  Procedure Laterality Date   ABDOMINAL HYSTERECTOMY     BREAST SURGERY     reduction   KNEE ARTHROSCOPY     TONSILLECTOMY      Social History   Socioeconomic History   Marital status: Married    Spouse name: Not on file   Number of children: Not on file   Years of education: Not on file   Highest education level: Not on file  Occupational History   Not on file  Tobacco Use   Smoking status: Every Day    Packs/day: 1.00    Types: Cigarettes   Smokeless tobacco: Never  Substance and Sexual Activity   Alcohol use: No   Drug use: Yes    Frequency: 7.0 times per week    Types: Marijuana    Comment: daily marijuana use   Sexual activity: Not on file  Other Topics Concern   Not on file  Social History Narrative   Not on file   Social Determinants of Health   Financial Resource  Strain: Not on file  Food Insecurity: Not on file  Transportation Needs: Not on file  Physical Activity: Not on file  Stress: Not on file  Social Connections: Not on file  Intimate Partner Violence: Not on file    Family History  Problem Relation Age of Onset   Dementia Mother    Congestive Heart Failure Father     ROS: no fevers or chills, productive cough, hemoptysis, dysphasia, odynophagia, melena, hematochezia, dysuria, hematuria, rash, seizure activity, orthopnea, PND, pedal edema, claudication. Remaining systems are negative.  Physical Exam: Well-developed well-nourished in no acute distress.  Skin is warm and dry.  HEENT is normal.  Neck is supple.  Chest is clear to auscultation with normal expansion.   Cardiovascular exam is regular rate and rhythm.  Abdominal exam nontender or distended. No masses palpated. Extremities show no edema. neuro grossly intact  A/P  1 paroxysmal atrial fibrillation-patient remains in sinus rhythm on examination today.  Continue Cardizem.  CHA2DS2-VASc is 1 for female sex and we have therefore not anticoagulated.  Continue aspirin.  2 dyspnea-previous echocardiogram showed normal LV function and CTA revealed no coronary disease.  There was note of emphysema and I think this is the likely cause of her dyspnea.  3 tobacco abuse-patient counseled on discontinuing.  4 hyperlipidemia-continue Zetia.  Lipids and liver monitored by primary care.  Kirk Ruths, MD

## 2021-05-14 ENCOUNTER — Encounter: Payer: Self-pay | Admitting: Cardiology

## 2021-05-14 ENCOUNTER — Other Ambulatory Visit: Payer: Self-pay

## 2021-05-14 ENCOUNTER — Ambulatory Visit: Payer: Medicare HMO | Admitting: Cardiology

## 2021-05-14 VITALS — BP 122/64 | HR 76 | Ht 63.5 in | Wt 168.0 lb

## 2021-05-14 DIAGNOSIS — R0609 Other forms of dyspnea: Secondary | ICD-10-CM | POA: Diagnosis not present

## 2021-05-14 DIAGNOSIS — E7800 Pure hypercholesterolemia, unspecified: Secondary | ICD-10-CM | POA: Diagnosis not present

## 2021-05-14 DIAGNOSIS — Z720 Tobacco use: Secondary | ICD-10-CM | POA: Diagnosis not present

## 2021-05-14 DIAGNOSIS — I480 Paroxysmal atrial fibrillation: Secondary | ICD-10-CM | POA: Diagnosis not present

## 2021-05-14 NOTE — Patient Instructions (Signed)

## 2021-06-04 ENCOUNTER — Telehealth: Payer: Self-pay | Admitting: Cardiology

## 2021-06-04 NOTE — Telephone Encounter (Addendum)
Patient called triage to report she thinks she is in atrial fib. She was a bit sob while talking. She used albuterol for "COPD" this am. Her recent blood pressure readings at home are from 99/57 to 154/64. She does not have a pulse ox. She denies chest pain, dizziness, or fatigue. States she has "lots of energy". She stated the last time she had afib, the ER gave her oxygen, which converted her. Patient would like to know if Dr. Stanford Breed has any recommendations.

## 2021-06-04 NOTE — Telephone Encounter (Signed)
Pt c/o Shortness Of Breath: STAT if SOB developed within the last 24 hours or pt is noticeably SOB on the phone  1. Are you currently SOB (can you hear that pt is SOB on the phone)? Yes   2. How long have you been experiencing SOB? Since this morning   3. Are you SOB when sitting or when up moving around? Moving around   4. Are you currently experiencing any other symptoms? No

## 2021-06-05 ENCOUNTER — Emergency Department (HOSPITAL_COMMUNITY): Payer: Medicare HMO

## 2021-06-05 ENCOUNTER — Other Ambulatory Visit: Payer: Self-pay

## 2021-06-05 ENCOUNTER — Emergency Department (HOSPITAL_COMMUNITY)
Admission: EM | Admit: 2021-06-05 | Discharge: 2021-06-05 | Disposition: A | Discharge status: Home / Self Care | Payer: Medicare HMO | Attending: Emergency Medicine | Admitting: Emergency Medicine

## 2021-06-05 ENCOUNTER — Encounter (HOSPITAL_COMMUNITY): Payer: Self-pay | Admitting: Emergency Medicine

## 2021-06-05 DIAGNOSIS — I480 Paroxysmal atrial fibrillation: Secondary | ICD-10-CM | POA: Insufficient documentation

## 2021-06-05 DIAGNOSIS — Z7901 Long term (current) use of anticoagulants: Secondary | ICD-10-CM | POA: Diagnosis not present

## 2021-06-05 DIAGNOSIS — R0602 Shortness of breath: Secondary | ICD-10-CM | POA: Diagnosis present

## 2021-06-05 DIAGNOSIS — Z20822 Contact with and (suspected) exposure to covid-19: Secondary | ICD-10-CM | POA: Diagnosis not present

## 2021-06-05 DIAGNOSIS — F17210 Nicotine dependence, cigarettes, uncomplicated: Secondary | ICD-10-CM | POA: Diagnosis not present

## 2021-06-05 HISTORY — DX: Unspecified atrial fibrillation: I48.91

## 2021-06-05 LAB — CBC
HCT: 47.9 % — ABNORMAL HIGH (ref 36.0–46.0)
Hemoglobin: 15.1 g/dL — ABNORMAL HIGH (ref 12.0–15.0)
MCH: 28.2 pg (ref 26.0–34.0)
MCHC: 31.5 g/dL (ref 30.0–36.0)
MCV: 89.4 fL (ref 80.0–100.0)
Platelets: 270 10*3/uL (ref 150–400)
RBC: 5.36 MIL/uL — ABNORMAL HIGH (ref 3.87–5.11)
RDW: 13.8 % (ref 11.5–15.5)
WBC: 9.6 10*3/uL (ref 4.0–10.5)
nRBC: 0 % (ref 0.0–0.2)

## 2021-06-05 LAB — BASIC METABOLIC PANEL
Anion gap: 8 (ref 5–15)
BUN: 16 mg/dL (ref 8–23)
CO2: 27 mmol/L (ref 22–32)
Calcium: 8.9 mg/dL (ref 8.9–10.3)
Chloride: 104 mmol/L (ref 98–111)
Creatinine, Ser: 0.87 mg/dL (ref 0.44–1.00)
GFR, Estimated: >60 mL/min (ref >60)
Glucose, Bld: 114 mg/dL — ABNORMAL HIGH (ref 70–99)
Potassium: 4.1 mmol/L (ref 3.5–5.1)
Sodium: 139 mmol/L (ref 135–145)

## 2021-06-05 MED ORDER — APIXABAN 5 MG PO TABS: 5.0000 mg | ORAL_TABLET | Freq: Two times a day (BID) | ORAL | 0 refills | Status: DC

## 2021-06-05 MED ORDER — PROPOFOL 10 MG/ML IV BOLUS
40.0000 mg | Freq: Once | INTRAVENOUS | Status: AC
  Administered 2021-06-05: 18:00:00 40 mg via INTRAVENOUS
  Filled 2021-06-05: qty 20

## 2021-06-05 MED ORDER — APIXABAN 5 MG PO TABS
5.0000 mg | ORAL_TABLET | Freq: Once | ORAL | Status: AC
  Administered 2021-06-05: 19:00:00 5 mg via ORAL
  Filled 2021-06-05: qty 1

## 2021-06-05 MED ORDER — SODIUM CHLORIDE 0.9 % IV BOLUS
1000.0000 mL | Freq: Once | INTRAVENOUS | Status: AC
  Administered 2021-06-05: 17:00:00 1000 mL via INTRAVENOUS

## 2021-06-05 MED ORDER — PROPOFOL 10 MG/ML IV BOLUS
INTRAVENOUS | Status: DC | PRN
  Administered 2021-06-05: 40 mg via INTRAVENOUS

## 2021-06-05 NOTE — Telephone Encounter (Signed)
Spoke to patient . She states she is  till having issue with shortness of breath and her heart is flip flopping she states. She states she has been up  all night.  RN asked if she can obtained a blood pressure this morning. She states she has not but can now. Blood pressure  reading 166/79  pulse 74. She states she took her Diltiazem 120 mg this morning at 1:30 am.  RN informed patient if symptoms are worsen from yesterday  suggestion call 911 and go to ER for evaluation.  Patient wanted to know if  shew would be seen right away. RN  stated to patient -it depends on the severity of  her condition compared to who is already at the ER. Patient verbalized understanding.

## 2021-06-05 NOTE — Telephone Encounter (Signed)
°  Per MyChart scheduling message:   I think Im in AFIB. I spoke to triage about it & she sent Dr Stanford Breed a message and I havent heard back. I need to come in tomorrow to get an EKG & see Im in Afib. Urgent please

## 2021-06-05 NOTE — Telephone Encounter (Signed)
Spoke with patient. She just took a shower and will call EMS to take her to hospital for "afib". We scheduled an appointment with Laurann Montana for 06/10/21.

## 2021-06-05 NOTE — ED Provider Notes (Signed)
[facility] EMERGENCY DEPARTMENT Provider Note   CSN: [id number] Arrival date & time: 06/05/21  1106     History Chief Complaint  Patient presents with   Shortness of Breath    Amanda [name] is a 65 y.o. female.  HPI 65 year old female presents with recurrent atrial fibrillation.  She states has been in at 2 or 3 times in the past.  She feels very sensitive to it.  Yesterday she woke up and was feeling short of breath and feeling palpitations especially when walking.  She is tired and at times dizzy.  At rest she seems to be mostly okay.  There is no chest pain though there is an abnormal sensation in her chest.  No recent illness and no fever or cough.  She is on Cardizem daily and a baby aspirin.  Past Medical History:  Diagnosis Date   Atrial fibrillation [facility])    DEGENERATIVE JOINT DISEASE 11/12/2006   DISORDER, BIPOLAR NOS 02/28/2007   GERD 11/12/2006    Patient Active Problem List   Diagnosis Date Noted   Atrial fibrillation ([place]) 07/03/2017   Tobacco abuse 07/03/2017   DISORDER, BIPOLAR NOS 02/28/2007   GERD 11/12/2006   DEGENERATIVE JOINT DISEASE 11/12/2006    Past Surgical History:  Procedure Laterality Date   ABDOMINAL HYSTERECTOMY     BREAST SURGERY     reduction   KNEE ARTHROSCOPY     TONSILLECTOMY       OB History   No obstetric history on file.     Family History  Problem Relation Age of Onset   Dementia Mother    Congestive Heart Failure Father     Social History   Tobacco Use   Smoking status: Every Day    Packs/day: 1.00    Types: Cigarettes   Smokeless tobacco: Never  Substance Use Topics   Alcohol use: No   Drug use: Yes    Frequency: 7.0 times per week    Types: Marijuana    Comment: daily marijuana use    Home Medications Prior to Admission medications   Medication Sig Start Date End Date Taking? Authorizing Provider  albuterol (VENTOLIN HFA) 108 (90 Base) MCG/ACT inhaler Inhale 2 puffs into the  lungs in the morning and at bedtime. 05/08/21  Yes [provider]  apixaban (ELIQUIS) 5 MG TABS tablet Take 1 tablet (5 mg total) by mouth 2 (two) times daily. 06/05/21 07/05/21 Yes Sherwood Gambler, MD  augmented betamethasone dipropionate (DIPROLENE-AF) 0.05 % cream Apply 1 application topically 2 (two) times daily as needed (itching). 04/07/21  Yes [provider]  Buprenorphine HCl-Naloxone HCl 1.4-0.36 MG SUBL Place 1-2 tablets under the tongue as directed. Take 2 tablets in the morning and Take 1 tablet in afternoon   Yes [provider]  citalopram (CELEXA) 20 MG tablet TAKE 1 TABLET (20 MG TOTAL) BY MOUTH AT BEDTIME. 04/01/21  Yes Hurst, Teresa T, PA-C  diltiazem (CARDIZEM CD) 120 MG 24 hr capsule TAKE 1 CAPSULE (120 MG TOTAL) BY MOUTH DAILY. 04/28/21  Yes Lelon Perla, MD  ezetimibe (ZETIA) 10 MG tablet Take 10 mg by mouth daily.   Yes [provider]  gabapentin (NEURONTIN) 100 MG capsule Take 1 capsule (100 mg total) by mouth at bedtime. 03/11/21  Yes Hurst, Dorothea Glassman, PA-C  lamoTRIgine (LAMICTAL) 100 MG tablet TAKE 1 TABLET (100 MG TOTAL) BY MOUTH DAILY. 04/01/21  Yes Hurst, Teresa T, PA-C  ondansetron (ZOFRAN-ODT) 4 MG disintegrating tablet Take 2  mg by mouth every 6 (six) hours as needed for nausea or vomiting. 12/19/20  Yes [provider]  risperiDONE (RISPERDAL) 1 MG tablet Take 1 tablet (1 mg total) by mouth at bedtime. 03/11/21  Yes Donnal Moat T, PA-C  vitamin C (ASCORBIC ACID) 500 MG tablet Take 1,000 mg by mouth daily.   Yes [provider]  clotrimazole-betamethasone (LOTRISONE) cream APPLY TOPICALLY TWICE DAILY Patient not taking: Reported on 06/05/2021 05/27/11   Marletta Lor, MD  metoprolol tartrate (LOPRESSOR) 50 MG tablet TAKE 2 HOURS PRIOR TO CT SCAN Patient not taking: Reported on 05/14/2021 10/04/20   Lelon Perla, MD    Allergies    Augmentin [amoxicillin-pot clavulanate], Sumatriptan, and Doxycycline  hyclate  Review of Systems   Review of Systems  Constitutional:  Negative for fever.  Respiratory:  Positive for shortness of breath. Negative for cough.   Cardiovascular:  Positive for palpitations. Negative for chest pain and leg swelling.  Neurological:  Positive for dizziness.  All other systems reviewed and are negative.  Physical Exam Updated Vital Signs BP 131/63    Pulse 64    Temp 99.1 F (37.3 C) (Oral)    Resp 19    Wt 77.1 kg    SpO2 92%    BMI 29.64 kg/m   Physical Exam Vitals and nursing note reviewed.  Constitutional:      General: She is not in acute distress.    Appearance: She is well-developed. She is not ill-appearing or diaphoretic.  HENT:     Head: Normocephalic and atraumatic.     Right Ear: External ear normal.     Left Ear: External ear normal.     Nose: Nose normal.  Eyes:     General:        Right eye: No discharge.        Left eye: No discharge.  Cardiovascular:     Rate and Rhythm: Normal rate. Rhythm irregular.     Heart sounds: Normal heart sounds.  Pulmonary:     Effort: Pulmonary effort is normal.     Breath sounds: Normal breath sounds.  Abdominal:     Palpations: Abdomen is soft.     Tenderness: There is no abdominal tenderness.  Musculoskeletal:     Right lower leg: No edema.     Left lower leg: No edema.  Skin:    General: Skin is warm and dry.  Neurological:     Mental Status: She is alert.  Psychiatric:        Mood and Affect: Mood is not anxious.    ED Results / Procedures / Treatments   Labs (all labs ordered are listed, but only abnormal results are displayed) Labs Reviewed  BASIC METABOLIC PANEL - Abnormal; Notable for the following components:      Result Value   Glucose, Bld 114 (*)    All other components within normal limits  CBC - Abnormal; Notable for the following components:   RBC 5.36 (*)    Hemoglobin 15.1 (*)    HCT 47.9 (*)    All other components within normal limits    EKG EKG  Interpretation  Date/Time:  Thursday June 05 2021 11:13:56 EST Ventricular Rate:  78 PR Interval:    QRS Duration: 78 QT Interval:  390 QTC Calculation: 444 R Axis:   25 Text Interpretation: Atrial fibrillation Abnormal ECG Similar to 2019 Confirmed by Sherwood Gambler [phone]) on 06/05/2021 4:04:33 PM   EKG Interpretation  Date/Time:  Thursday June 05 2021 17:39:14 EST Ventricular Rate:  59 PR Interval:  163 QRS Duration: 84 QT Interval:  432 QTC Calculation: 428 R Axis:   33 Text Interpretation: Sinus rhythm Low voltage, precordial leads afib no longer present when compared to earlier in the day Confirmed by Sherwood Gambler [phone]) on 06/05/2021 5:47:37 PM         Radiology DG Chest 2 View  Result Date: 06/05/2021 CLINICAL DATA:  65 year old female with history of shortness of breath. Atrial fibrillation. EXAM: CHEST - 2 VIEW COMPARISON:  Chest x-ray 07/03/2017. FINDINGS: Lung volumes are normal. No consolidative airspace disease. No pleural effusions. No pneumothorax. No pulmonary nodule or mass noted. Pulmonary vasculature and the cardiomediastinal silhouette are within normal limits. IMPRESSION: No radiographic evidence of acute cardiopulmonary disease. Electronically Signed   By: Vinnie Langton M.D.   On: 06/05/2021 12:03    Procedures .Cardioversion  Date/Time: 06/05/2021 5:43 PM Performed by: Sherwood Gambler, MD Authorized by: Sherwood Gambler, MD   Consent:    Consent obtained:  Verbal and written   Consent given by:  Patient   Risks discussed:  Cutaneous burn, death, induced arrhythmia and pain Pre-procedure details:    Cardioversion basis:  Emergent   Rhythm:  Atrial fibrillation   Electrode placement:  Anterior-posterior Patient sedated: Yes. Refer to sedation procedure documentation for details of sedation.  Attempt one:    Cardioversion mode:  Synchronous   Waveform:  Biphasic   Shock (Joules):  150   Shock outcome:  Conversion to normal sinus  rhythm Post-procedure details:    Patient status:  Awake   Patient tolerance of procedure:  Tolerated well, no immediate complications .Sedation  Date/Time: 06/05/2021 5:43 PM Performed by: Sherwood Gambler, MD Authorized by: Sherwood Gambler, MD   Consent:    Consent obtained:  Verbal and written   Consent given by:  Patient   Risks discussed:  Dysrhythmia, inadequate sedation, nausea, allergic reaction, prolonged hypoxia resulting in organ damage, prolonged sedation necessitating reversal, respiratory compromise necessitating ventilatory assistance and intubation and vomiting Universal protocol:    Immediately prior to procedure, a time out was called: yes     Patient identity confirmed:  Verbally with patient Indications:    Procedure performed:  Cardioversion   Procedure necessitating sedation performed by:  Physician performing sedation Pre-sedation assessment:    Time since last food or drink:  3 hours   NPO status caution: urgency dictates proceeding with non-ideal NPO status     ASA classification: class 2 - patient with mild systemic disease     Mouth opening:  3 or more finger widths   Mallampati score:  II - soft palate, uvula, fauces visible   Neck mobility: normal     Pre-sedation assessments completed and reviewed: airway patency, cardiovascular function, hydration status, mental status, nausea/vomiting, pain level, respiratory function and temperature   Immediate pre-procedure details:    Reviewed: vital signs, relevant labs/tests and NPO status     Verified: bag valve mask available, emergency equipment available, intubation equipment available, IV patency confirmed, oxygen available and suction available   Procedure details (see MAR for exact dosages):    Preoxygenation:  Nasal cannula   Sedation:  Propofol   Intended level of sedation: deep   Analgesia:  None   Intra-procedure monitoring:  Blood pressure monitoring, cardiac monitor, continuous pulse oximetry,  continuous capnometry, frequent LOC assessments and frequent vital sign checks   Intra-procedure events: none     Total Provider sedation time (minutes):  10 Post-procedure details:    Attendance: Constant attendance by certified staff until patient recovered     Recovery: Patient returned to pre-procedure baseline     Patient is stable for discharge or admission: yes     Procedure completion:  Tolerated well, no immediate complications   Medications Ordered in ED Medications  propofol (DIPRIVAN) 10 mg/mL bolus/IV push (40 mg Intravenous Given 06/05/21 1736)  sodium chloride 0.9 % bolus 1,000 mL (0 mLs Intravenous Stopped 06/05/21 1910)  propofol (DIPRIVAN) 10 mg/mL bolus/IV push 40 mg (40 mg Intravenous Given 06/05/21 1735)  apixaban (ELIQUIS) tablet 5 mg (5 mg Oral Given 06/05/21 1910)    ED Course  I have reviewed the triage vital signs and the nursing notes.  Pertinent labs & imaging results that were available during my care of the patient were reviewed by me and considered in my medical decision making (see chart for details).    MDM Rules/Calculators/A&P     CHA2DS2-VASc Score: 1                    Given patient is symptomatic and sensitive to her paroxysmal A. fib with a clear onset yesterday morning (less than 48 hours ago), we decided to cardiovert patient after discussion with her.  She will need to go on Eliquis due to the cardioversion but otherwise has a cha2ds2-vasc score of 1.  Cardioversion was successful without obvious complication.  Given Eliquis here and prescription.  Tolerated sedation well.  No anginal symptoms and she walked to the bathroom and felt no recurrent shortness of breath or symptoms of her A. fib after being cardioverted.  Given return precautions.         Final Clinical Impression(s) / ED Diagnoses Final diagnoses:  Paroxysmal atrial fibrillation ([place])    Rx / DC Orders ED Discharge Orders          Ordered    apixaban (ELIQUIS) 5 MG TABS  tablet  2 times daily        06/05/21 1818             Sherwood Gambler, MD 06/05/21 2229

## 2021-06-05 NOTE — Sedation Documentation (Signed)
Cardioversion at 150J

## 2021-06-05 NOTE — Discharge Instructions (Addendum)
You are being prescribed Eliquis, which is a blood thinner.  You need to immediately stop aspirin's and do not take Ibuprofen/Advil/Aleve/Motrin/Goody Powders/Naproxen/BC powders/Meloxicam/Diclofenac/Indomethacin and other Nonsteroidal anti-inflammatory medications.  If you develop bleeding, injure your head, or any other new/concerning symptoms then return to the ER.

## 2021-06-05 NOTE — ED Notes (Signed)
Consent obtained for cardioversion and sedation.

## 2021-06-05 NOTE — ED Provider Notes (Signed)
Emergency Medicine Provider Triage Evaluation Note  Amanda [name] , a 65 y.o. female  was evaluated in triage.  Pt complains of SOB and heart rate feeling more "erratic". Hx of Afib. Called primary doctor who advised she needed to be seen at the hospital.  Michela Pitcher that she woke up yesterday feeling palpitations, and had a harder time breathing.  She feels more dizzy when taking deep breaths.  Review of Systems  Positive: Palpitations, shortness of breath, dizziness Negative: Fever, chills, chest pain, headache, vision changes  Physical Exam  BP 115/66 (BP Location: Left Arm)    Pulse 75    Temp 99.1 F (37.3 C) (Oral)    Resp 16    SpO2 93%  Gen:   Awake, no distress   Resp:  Normal effort  MSK:   Moves extremities without difficulty  Other:  HR irregularly irregular, good pulses  Medical Decision Making  Medically screening exam initiated at 11:21 AM.  Appropriate orders placed.  Amanda [name] was informed that the remainder of the evaluation will be completed by another provider, this initial triage assessment does not replace that evaluation, and the importance of remaining in the ED until their evaluation is complete.     Estill Cotta 06/05/21 1121    Wyvonnia Dusky, MD 06/05/21 1427

## 2021-06-05 NOTE — ED Notes (Signed)
Patient verbalizes understanding of discharge instructions. Prescriptions and follow-up cafe reviewed. Opportunity for questioning and answers were provided. Armband removed by staff, pt discharged from ED ambulatory.

## 2021-06-05 NOTE — ED Triage Notes (Signed)
Pt to triage via [facility] from home.  Woke up yesterday morning with SOB, palpitations, and feeling lightheaded.  History of afib.  States HR has been up and down. Denies chest pain.  Denies edema, fever, and chills.

## 2021-06-05 NOTE — Telephone Encounter (Signed)
Patient c/o Palpitations:  High priority if patient c/o lightheadedness, shortness of breath, or chest pain  How long have you had palpitations/irregular HR/ Afib? Are you having the symptoms now? yes  Are you currently experiencing lightheadedness, SOB or CP? sob  Do you have a history of afib (atrial fibrillation) or irregular heart rhythm? yes  Have you checked your BP or HR? (document readings if available): no  Are you experiencing any other symptoms? no  

## 2021-06-06 ENCOUNTER — Encounter: Payer: Self-pay | Admitting: Cardiology

## 2021-06-06 NOTE — Progress Notes (Signed)
Cardiology Office Note:    Date:  06/10/2021   ID:  Amanda [name], DOB 09-Dec-1955, MRN [id number]  PCP:  Emelia Loron, NP   [facility] Providers Cardiologist:  Kirk Ruths, MD     Referring MD: Emelia Loron, NP   Chief Complaint: f/u recent ED visit for atrial fibrillation   History of Present Illness:    Amanda [name] is a 65 y.o. female with a hx of paroxysmal atrial fibrillation, tobacco abuse, hyperlipidemia, aortic atherosclerosis, and COPD.  She was last seen in our office on 05/14/21 by Dr. Stanford Breed at which time no changes were made to her medical regimen and she was advised to follow-up in one year. She called the office on 12/28 with concerns for shortness of breath with atrial fibrillation for approximately 40 hours. She went to [facility] ED on 06/05/21 and initial EKG revealed atrial fib at a rate of 78 bpm. She underwent successful cardioversion and was placed on anticoagulation.   Today, she is here alone for follow-up of recent ED visit. She reports she has been feeling well since her cardioversion and has had no further episodes of a fib.  She is taking Eliquis 5 mg twice daily and is aware to continue this for a total of 4 weeks.  She reports that when she has a fib she feels severe palpitations and occasional dizziness. She denies chest pain, shortness of breath, lower extremity edema, fatigue, palpitations, melena, diaphoresis, weakness, presyncope, syncope, orthopnea, and PND.  She admits to snoring, but does not have symptoms of daytime somnolence and her husband has not reported periods of apnea.  Advised her to continue to monitor. States her blood pressure has been well controlled recently.   Past Medical History:  Diagnosis Date   Atrial fibrillation [facility])    DEGENERATIVE JOINT DISEASE 11/12/2006   DISORDER, BIPOLAR NOS 02/28/2007   GERD 11/12/2006    Past Surgical History:  Procedure Laterality Date   ABDOMINAL HYSTERECTOMY     BREAST SURGERY      reduction   KNEE ARTHROSCOPY     TONSILLECTOMY      Current Medications: Current Meds  Medication Sig   albuterol (VENTOLIN HFA) 108 (90 Base) MCG/ACT inhaler Inhale 2 puffs into the lungs in the morning and at bedtime.   apixaban (ELIQUIS) 5 MG TABS tablet Take 1 tablet (5 mg total) by mouth 2 (two) times daily.   augmented betamethasone dipropionate (DIPROLENE-AF) 0.05 % cream Apply 1 application topically 2 (two) times daily as needed (itching).   Buprenorphine HCl-Naloxone HCl 1.4-0.36 MG SUBL Place 1-2 tablets under the tongue as directed. Take 2 tablets in the morning and Take 1 tablet in afternoon   citalopram (CELEXA) 20 MG tablet TAKE 1 TABLET (20 MG TOTAL) BY MOUTH AT BEDTIME.   clotrimazole-betamethasone (LOTRISONE) cream APPLY TOPICALLY TWICE DAILY   diltiazem (CARDIZEM CD) 120 MG 24 hr capsule TAKE 1 CAPSULE (120 MG TOTAL) BY MOUTH DAILY.   ezetimibe (ZETIA) 10 MG tablet Take 10 mg by mouth daily.   gabapentin (NEURONTIN) 100 MG capsule Take 1 capsule (100 mg total) by mouth at bedtime.   lamoTRIgine (LAMICTAL) 100 MG tablet TAKE 1 TABLET (100 MG TOTAL) BY MOUTH DAILY.   metoprolol tartrate (LOPRESSOR) 50 MG tablet TAKE 2 HOURS PRIOR TO CT SCAN   ondansetron (ZOFRAN-ODT) 4 MG disintegrating tablet Take 2 mg by mouth every 6 (six) hours as needed for nausea or vomiting.   risperiDONE (RISPERDAL) 1 MG tablet Take 1 tablet (  1 mg total) by mouth at bedtime.   vitamin C (ASCORBIC ACID) 500 MG tablet Take 1,000 mg by mouth daily.     Allergies:   Augmentin [amoxicillin-pot clavulanate], Sumatriptan, and Doxycycline hyclate   Social History   Socioeconomic History   Marital status: Married    Spouse name: Not on file   Number of children: Not on file   Years of education: Not on file   Highest education level: Not on file  Occupational History   Not on file  Tobacco Use   Smoking status: Every Day    Packs/day: 1.00    Types: Cigarettes   Smokeless tobacco: Never   Substance and Sexual Activity   Alcohol use: No   Drug use: Yes    Frequency: 7.0 times per week    Types: Marijuana    Comment: daily marijuana use   Sexual activity: Not on file  Other Topics Concern   Not on file  Social History Narrative   Not on file   Social Determinants of Health   Financial Resource Strain: Not on file  Food Insecurity: Not on file  Transportation Needs: Not on file  Physical Activity: Not on file  Stress: Not on file  Social Connections: Not on file     Family History: The patient's family history includes Congestive Heart Failure in her father; Dementia in her mother.  ROS:   Please see the history of present illness.  +chronic stable SOB All other systems reviewed and are negative.  Labs/Other Studies Reviewed:    The following studies were reviewed today:  Echo 11/18/20  Left Ventricle: Left ventricular ejection fraction, by estimation, is 60  to 65%. The left ventricle has normal function. The left ventricle has no  regional wall motion abnormalities. The left ventricular internal cavity  size was normal in size. There is  no left ventricular hypertrophy. Left ventricular diastolic parameters were normal.  Right Ventricle: The right ventricular size is normal. No increase in  right ventricular wall thickness. Right ventricular systolic function is  normal.  Left Atrium: Left atrial size was normal in size.  Right Atrium: Right atrial size was normal in size.  Pericardium: There is no evidence of pericardial effusion.  Mitral Valve: The mitral valve was not well visualized. No evidence of  mitral valve regurgitation.  Tricuspid Valve: The tricuspid valve is not well visualized. Tricuspid  valve regurgitation is trivial.  Aortic Valve: The aortic valve was not well visualized. Aortic valve  regurgitation is not visualized. No aortic stenosis is present.  Pulmonic Valve: The pulmonic valve was not well visualized. Pulmonic valve   regurgitation is not visualized.  Aorta: The aortic root and ascending aorta are structurally normal, with  no evidence of dilitation.  IAS/Shunts: The atrial septum is grossly normal.    Cor CT 10/21/20  IMPRESSION: 1. Coronary calcium score of 0. 2. Normal coronary origin with right dominance. 3. No evidence of CAD.   CAD-RADS 0. No evidence of CAD (0%). Consider non-atherosclerotic causes of chest pain.   Recent Labs: 06/05/2021: BUN 16; Creatinine, Ser 0.87; Hemoglobin 15.1; Platelets 270; Potassium 4.1; Sodium 139  Recent Lipid Panel    Component Value Date/Time   CHOL 238 (HH) 10/29/2006 1047   LDLDIRECT 143.7 10/29/2006 1047     Risk Assessment/Calculations:    CHA2DS2-VASc Score = 1   This indicates a 0.6% annual risk of stroke. The patient's score is based upon: CHF History: 0 HTN History: 0 Diabetes History:  0 Stroke History: 0 Vascular Disease History: 0 Age Score: 0 Gender Score: 1    Physical Exam:    VS:  BP 118/68 (BP Location: Left Arm, Patient Position: Sitting, Cuff Size: Large)    Pulse (!) 58    Ht 5' 3.5" (1.613 m)    Wt 171 lb 11.2 oz (77.9 kg)    BMI 29.94 kg/m     Wt Readings from Last 3 Encounters:  06/10/21 171 lb 11.2 oz (77.9 kg)  06/05/21 170 lb (77.1 kg)  05/14/21 168 lb (76.2 kg)     GEN:  Well nourished, well developed in no acute distress HEENT: Normal NECK: No JVD; No carotid bruits LYMPHATICS: No lymphadenopathy CARDIAC: RRR, no murmurs, rubs, gallops RESPIRATORY:  Diminished breath sounds bilaterally without rales, wheezing or rhonchi  ABDOMEN: Soft, non-tender, non-distended MUSCULOSKELETAL:  No edema; No deformity  SKIN: Warm and dry NEUROLOGIC:  Alert and oriented x 3 PSYCHIATRIC:  Normal affect   EKG:  EKG is  orered today.  The ekg ordered today demonstrates sinus bradycardia at 58 bpm, no ST/T wave abnormality  Diagnoses:    1. Paroxysmal atrial fibrillation (HCC)   2. Current use of anticoagulant therapy    3. Tobacco abuse   4. Dyspnea on exertion    Assessment and Plan:     Paroxysmal atrial fibrillation: She is very happy that she is maintaining sinus rhythm today.  We discussed future management of atrial fibrillation including antiarrhythmics and ablation should she have future episodes of a fib. Encouraged her to monitor for symptoms of sleep apnea and notify us for sleep testing in the future if it is suspected. Encouraged weight loss and moderate intensity exercise 150 minutes/week. Continue diltiazem. Continue Eliquis x 4 weeks (end 07/06/21).  Anticoagulation: No concern for abnormal bleeding. She is currently on Eliquis 5 mg twice daily for 1 month following cardioversion.  Her CHA2DS2-VASc score is 1.  Explained that she will add an additional point at age 32 and chronic anticoagulation would be initiated at that time.  Tobacco abuse: She continues to smoke three fourths of a pack per day.  Encouraged complete cessation.  She is not ready to pursue at this time but admits "I don't want to die." Encouraged her to continue working to reduce number of cigarettes per day and to let us know if she would like assistance in the future.   Dyspnea on exertion: She reports chronic but stable shortness of breath related to history of smoking and COPD.  Management per PCP.  Disposition: F/u with Dr. Stanford Breed December 2023       Medication Adjustments/Labs and Tests Ordered: Current medicines are reviewed at length with the patient today.  Concerns regarding medicines are outlined above.  Orders Placed This Encounter  Procedures   EKG 12-Lead   No orders of the defined types were placed in this encounter.   Patient Instructions  Medication Instructions:  Your Physician recommend you continue on your current medication as directed.    *If you need a refill on your cardiac medications before your next appointment, please call your pharmacy*   Lab Work: None ordered today    Testing/Procedures: None ordered today    Follow-Up: At [facility], you and your health needs are our priority.  As part of our continuing mission to provide you with exceptional heart care, we have created designated Provider Care Teams.  These Care Teams include your primary Cardiologist (physician) and Advanced Practice Providers (APPs -  Physician Assistants and Nurse Practitioners) who all work together to provide you with the care you need, when you need it.  We recommend signing up for the patient portal called "MyChart".  Sign up information is provided on this After Visit Summary.  MyChart is used to connect with patients for Virtual Visits (Telemedicine).  Patients are able to view lab/test results, encounter notes, upcoming appointments, etc.  Non-urgent messages can be sent to your provider as well.   To learn more about what you can do with MyChart, go to [email or web address].    Your next appointment:   Follow up as scheduled in Dr. Stanford Breed in [place]   The format for your next appointment:   In Person  Provider:   Kirk Ruths, MD     Other Instructions None today   Signed, Emmaline Life, NP  06/10/2021 3:36 PM    [place]

## 2021-06-10 ENCOUNTER — Other Ambulatory Visit: Payer: Self-pay

## 2021-06-10 ENCOUNTER — Ambulatory Visit (HOSPITAL_BASED_OUTPATIENT_CLINIC_OR_DEPARTMENT_OTHER): Payer: Medicare HMO | Admitting: Nurse Practitioner

## 2021-06-10 ENCOUNTER — Encounter (HOSPITAL_BASED_OUTPATIENT_CLINIC_OR_DEPARTMENT_OTHER): Payer: Self-pay | Admitting: Nurse Practitioner

## 2021-06-10 VITALS — BP 118/68 | HR 58 | Ht 63.5 in | Wt 171.7 lb

## 2021-06-10 DIAGNOSIS — Z7901 Long term (current) use of anticoagulants: Secondary | ICD-10-CM | POA: Diagnosis not present

## 2021-06-10 DIAGNOSIS — R0609 Other forms of dyspnea: Secondary | ICD-10-CM

## 2021-06-10 DIAGNOSIS — I480 Paroxysmal atrial fibrillation: Secondary | ICD-10-CM | POA: Diagnosis not present

## 2021-06-10 DIAGNOSIS — Z720 Tobacco use: Secondary | ICD-10-CM

## 2021-06-10 NOTE — Patient Instructions (Signed)
Medication Instructions:  Your Physician recommend you continue on your current medication as directed.    *If you need a refill on your cardiac medications before your next appointment, please call your pharmacy*   Lab Work: None ordered today   Testing/Procedures: None ordered today    Follow-Up: At [facility], you and your health needs are our priority.  As part of our continuing mission to provide you with exceptional heart care, we have created designated Provider Care Teams.  These Care Teams include your primary Cardiologist (physician) and Advanced Practice Providers (APPs -  Physician Assistants and Nurse Practitioners) who all work together to provide you with the care you need, when you need it.  We recommend signing up for the patient portal called "MyChart".  Sign up information is provided on this After Visit Summary.  MyChart is used to connect with patients for Virtual Visits (Telemedicine).  Patients are able to view lab/test results, encounter notes, upcoming appointments, etc.  Non-urgent messages can be sent to your provider as well.   To learn more about what you can do with MyChart, go to [email or web address].    Your next appointment:   Follow up as scheduled in Dr. Stanford Breed in [place]   The format for your next appointment:   In Person  Provider:   Kirk Ruths, MD     Other Instructions None today

## 2021-07-04 ENCOUNTER — Ambulatory Visit (HOSPITAL_BASED_OUTPATIENT_CLINIC_OR_DEPARTMENT_OTHER): Payer: Medicare HMO | Admitting: Family

## 2021-07-15 ENCOUNTER — Ambulatory Visit (INDEPENDENT_AMBULATORY_CARE_PROVIDER_SITE_OTHER): Payer: Medicare HMO | Admitting: Nurse Practitioner

## 2021-07-15 ENCOUNTER — Other Ambulatory Visit: Payer: Self-pay

## 2021-07-15 ENCOUNTER — Encounter (HOSPITAL_BASED_OUTPATIENT_CLINIC_OR_DEPARTMENT_OTHER): Payer: Self-pay | Admitting: Nurse Practitioner

## 2021-07-15 VITALS — BP 128/82 | HR 99 | Ht 64.0 in | Wt 167.0 lb

## 2021-07-15 DIAGNOSIS — J209 Acute bronchitis, unspecified: Secondary | ICD-10-CM | POA: Insufficient documentation

## 2021-07-15 DIAGNOSIS — Z0000 Encounter for general adult medical examination without abnormal findings: Secondary | ICD-10-CM

## 2021-07-15 DIAGNOSIS — G894 Chronic pain syndrome: Secondary | ICD-10-CM | POA: Diagnosis not present

## 2021-07-15 DIAGNOSIS — G8929 Other chronic pain: Secondary | ICD-10-CM | POA: Insufficient documentation

## 2021-07-15 DIAGNOSIS — M79643 Pain in unspecified hand: Secondary | ICD-10-CM | POA: Insufficient documentation

## 2021-07-15 DIAGNOSIS — Z1211 Encounter for screening for malignant neoplasm of colon: Secondary | ICD-10-CM | POA: Insufficient documentation

## 2021-07-15 DIAGNOSIS — F1120 Opioid dependence, uncomplicated: Secondary | ICD-10-CM | POA: Insufficient documentation

## 2021-07-15 DIAGNOSIS — L299 Pruritus, unspecified: Secondary | ICD-10-CM | POA: Diagnosis not present

## 2021-07-15 DIAGNOSIS — F17200 Nicotine dependence, unspecified, uncomplicated: Secondary | ICD-10-CM | POA: Insufficient documentation

## 2021-07-15 DIAGNOSIS — M159 Polyosteoarthritis, unspecified: Secondary | ICD-10-CM

## 2021-07-15 DIAGNOSIS — F3170 Bipolar disorder, currently in remission, most recent episode unspecified: Secondary | ICD-10-CM | POA: Diagnosis not present

## 2021-07-15 DIAGNOSIS — E782 Mixed hyperlipidemia: Secondary | ICD-10-CM

## 2021-07-15 DIAGNOSIS — D229 Melanocytic nevi, unspecified: Secondary | ICD-10-CM

## 2021-07-15 DIAGNOSIS — J45909 Unspecified asthma, uncomplicated: Secondary | ICD-10-CM | POA: Insufficient documentation

## 2021-07-15 DIAGNOSIS — M549 Dorsalgia, unspecified: Secondary | ICD-10-CM | POA: Insufficient documentation

## 2021-07-15 DIAGNOSIS — Z79899 Other long term (current) drug therapy: Secondary | ICD-10-CM

## 2021-07-15 DIAGNOSIS — J449 Chronic obstructive pulmonary disease, unspecified: Secondary | ICD-10-CM | POA: Insufficient documentation

## 2021-07-15 DIAGNOSIS — E039 Hypothyroidism, unspecified: Secondary | ICD-10-CM

## 2021-07-15 DIAGNOSIS — E785 Hyperlipidemia, unspecified: Secondary | ICD-10-CM | POA: Insufficient documentation

## 2021-07-15 DIAGNOSIS — R7303 Prediabetes: Secondary | ICD-10-CM

## 2021-07-15 DIAGNOSIS — I480 Paroxysmal atrial fibrillation: Secondary | ICD-10-CM

## 2021-07-15 DIAGNOSIS — F329 Major depressive disorder, single episode, unspecified: Secondary | ICD-10-CM | POA: Insufficient documentation

## 2021-07-15 DIAGNOSIS — G5600 Carpal tunnel syndrome, unspecified upper limb: Secondary | ICD-10-CM | POA: Insufficient documentation

## 2021-07-15 DIAGNOSIS — I10 Essential (primary) hypertension: Secondary | ICD-10-CM

## 2021-07-15 DIAGNOSIS — Z87891 Personal history of nicotine dependence: Secondary | ICD-10-CM | POA: Insufficient documentation

## 2021-07-15 DIAGNOSIS — E538 Deficiency of other specified B group vitamins: Secondary | ICD-10-CM | POA: Insufficient documentation

## 2021-07-15 HISTORY — DX: Major depressive disorder, single episode, unspecified: F32.9

## 2021-07-15 NOTE — Patient Instructions (Addendum)
Thank you for choosing [place] at [facility] for your Primary Care needs. I am excited for the opportunity to partner with you to meet your health care goals. It was a pleasure meeting you today!  Recommendations from today's visit: I have sent the referral to dermatology to look at the moles that you have concerns with. They should contact you in the next 10 days to schedule this.  I will let you know about the labs once we get the results back  Information on diet, exercise, and health maintenance recommendations are listed below. This is information to help you be sure you are on track for optimal health and monitoring.   Please look over this and let us know if you have any questions or if you have completed any of the health maintenance outside of [place] so that we can be sure your records are up to date.  ___________________________________________________________ About Me: I am an Adult-Geriatric Nurse Practitioner with a background in caring for patients for more than 20 years with a strong intensive care background. I provide primary care and sports medicine services to patients age 7 and older within this office. My education had a strong focus on caring for the older adult population, which I am passionate about. I am also the director of the APP Fellowship with [facility].   My desire is to provide you with the best service through preventive medicine and supportive care. I consider you a part of the medical team and value your input. I work diligently to ensure that you are heard and your needs are met in a safe and effective manner. I want you to feel comfortable with me as your provider and want you to know that your health concerns are important to me.  For your information, our office hours are: Monday, Tuesday, and Thursday 8:00 AM - 5:00 PM Wednesday and Friday 8:00 AM - 12:00 PM.   In my time away from the office I am teaching new APP's within  the system and am unavailable, but my partner, Dr. Burnard Bunting is in the office for emergent needs.   If you have questions or concerns, please call our office at [phone] or send [place] a MyChart message and we will respond as quickly as possible.  ____________________________________________________________ MyChart:  For all urgent or time sensitive needs we ask that you please call the office to avoid delays. Our number is [phone]. MyChart is not constantly monitored and due to the large volume of messages a day, replies may take up to 72 business hours.  MyChart Policy: MyChart allows for you to see your visit notes, after visit summary, provider recommendations, lab and tests results, make an appointment, request refills, and contact your provider or the office for non-urgent questions or concerns. Providers are seeing patients during normal business hours and do not have built in time to review MyChart messages.  We ask that you allow a minimum of 3 business days for responses to [employer]. For this reason, please do not send urgent requests through [place]. Please call the office at [phone]. New and ongoing conditions may require a visit. We have virtual and in person visit available for your convenience.  Complex MyChart concerns may require a visit. Your provider may request you schedule a virtual or in person visit to ensure we are providing the best care possible. MyChart messages sent after 11:00 AM on Friday will not be received by the provider until Monday morning.  Lab and Test Results: You will receive your lab and test results on MyChart as soon as they are completed and results have been sent by the lab or testing facility. Due to this service, you will receive your results BEFORE your provider.  I review lab and tests results each morning prior to seeing patients. Some results require collaboration with other providers to ensure you are receiving the most  appropriate care. For this reason, we ask that you please allow a minimum of 3-5 business days from the time the ALL results have been received for your provider to receive and review lab and test results and contact you about these.  Most lab and test result comments from the provider will be sent through [place]. Your provider may recommend changes to the plan of care, follow-up visits, repeat testing, ask questions, or request an office visit to discuss these results. You may reply directly to this message or call the office at [phone] to provide information for the provider or set up an appointment. In some instances, you will be called with test results and recommendations. Please let us know if this is preferred and we will make note of this in your chart to provide this for you.    If you have not heard a response to your lab or test results in 5 business days from all results returning to [place], please call the office to let us know. We ask that you please avoid calling prior to this time unless there is an emergent concern. Due to high call volumes, this can delay the resulting process.  After Hours: For all non-emergency after hours needs, please call the office at [phone] and select the option to reach the on-call provider service. On-call services are shared between multiple [place] offices and therefore it will not be possible to speak directly with your provider. On-call providers may provide medical advice and recommendations, but are unable to provide refills for maintenance medications.  For all emergency or urgent medical needs after normal business hours, we recommend that you seek care at the closest Urgent Care or Emergency Department to ensure appropriate treatment in a timely manner.  [facility] [place] has a 24 hour emergency room located on the ground floor for your convenience.   Urgent Concerns During the Business Day Providers are seeing patients  from 8AM to [place] with a busy schedule and are most often not able to respond to non-urgent calls until the end of the day or the next business day. If you should have URGENT concerns during the day, please call and speak to the nurse or schedule a same day appointment so that we can address your concern without delay.   Thank you, again, for choosing me as your health care partner. I appreciate your trust and look forward to learning more about you.   Worthy Keeler, DNP, AGNP-c ___________________________________________________________  Health Maintenance Recommendations Screening Testing Mammogram Every 1 -2 years based on history and risk factors Starting at age 8 Pap Smear Ages 21-39 every 3 years Ages 60-65 every 5 years with HPV testing More frequent testing may be required based on results and history Colon Cancer Screening Every 1-10 years based on test performed, risk factors, and history Starting at age 64 Bone Density Screening Every 2-10 years based on history Starting at age 28 for women Recommendations for men differ based on medication usage, history, and risk factors AAA Screening One time ultrasound Men 9-75 years old who have  every smoked Lung Cancer Screening Low Dose Lung CT every 12 months Age 58-80 years with a 30 pack-year smoking history who still smoke or who have quit within the last 15 years  Screening Labs Routine  Labs: Complete Blood Count (CBC), Complete Metabolic Panel (CMP), Cholesterol (Lipid Panel) Every 6-12 months based on history and medications May be recommended more frequently based on current conditions or previous results Hemoglobin A1c Lab Every 3-12 months based on history and previous results Starting at age 46 or earlier with diagnosis of diabetes, high cholesterol, BMI >26, and/or risk factors Frequent monitoring for patients with diabetes to ensure blood sugar control Thyroid Panel (TSH w/ T3 & T4) Every 6 months based on  history, symptoms, and risk factors May be repeated more often if on medication HIV One time testing for all patients 27 and older May be repeated more frequently for patients with increased risk factors or exposure Hepatitis C One time testing for all patients 45 and older May be repeated more frequently for patients with increased risk factors or exposure Gonorrhea, Chlamydia Every 12 months for all sexually active persons 13-24 years Additional monitoring may be recommended for those who are considered high risk or who have symptoms PSA Men 30-73 years old with risk factors Additional screening may be recommended from age 58-69 based on risk factors, symptoms, and history  Vaccine Recommendations Tetanus Booster All adults every 10 years Flu Vaccine All patients 6 months and older every year COVID Vaccine All patients 12 years and older Initial dosing with booster May recommend additional booster based on age and health history HPV Vaccine 2 doses all patients age 22-26 Dosing may be considered for patients over 26 Shingles Vaccine (Shingrix) 2 doses all adults 31 years and older Pneumonia (Pneumovax 23) All adults 20 years and older May recommend earlier dosing based on health history Pneumonia (Prevnar 55) All adults 53 years and older Dosed 1 year after Pneumovax 23  Additional Screening, Testing, and Vaccinations may be recommended on an individualized basis based on family history, health history, risk factors, and/or exposure.  __________________________________________________________  Diet Recommendations for All Patients  I recommend that all patients maintain a diet low in saturated fats, carbohydrates, and cholesterol. While this can be challenging at first, it is not impossible and small changes can make big differences.  Things to try: Decreasing the amount of soda, sweet tea, and/or juice to one or less per day and replace with water While water is always  the first choice, if you do not like water you may consider adding a water additive without sugar to improve the taste other sugar free drinks Replace potatoes with a brightly colored vegetable at dinner Use healthy oils, such as canola oil or olive oil, instead of butter or hard margarine Limit your bread intake to two pieces or less a day Replace regular pasta with low carb pasta options Bake, broil, or grill foods instead of frying Monitor portion sizes  Eat smaller, more frequent meals throughout the day instead of large meals  An important thing to remember is, if you love foods that are not great for your health, you don't have to give them up completely. Instead, allow these foods to be a reward when you have done well. Allowing yourself to still have special treats every once in a while is a nice way to tell yourself thank you for working hard to keep yourself healthy.   Also remember that every day is a new day.  If you have a bad day and "fall off the wagon", you can still climb right back up and keep moving along on your journey!  We have resources available to help you!  Some websites that may be helpful include: www.http://carter.biz/  Www.[employer]Well[employer].com _____________________________________________________________  Activity Recommendations for All Patients  I recommend that all adults get at least 20 minutes of moderate physical activity that elevates your heart rate at least 5 days out of the week.  Some examples include: Walking or jogging at a pace that allows you to carry on a conversation Cycling (stationary bike or outdoors) Water aerobics Yoga Weight lifting Dancing If physical limitations prevent you from putting stress on your joints, exercise in a pool or seated in a chair are excellent options.  Do determine your MAXIMUM heart rate for activity: YOUR AGE - 220 = MAX HeartRate   Remember! Do not push yourself too hard.  Start slowly and build up your pace, speed,  weight, time in exercise, etc.  Allow your body to rest between exercise and get good sleep. You will need more water than normal when you are exerting yourself. Do not wait until you are thirsty to drink. Drink with a purpose of getting in at least 8, 8 ounce glasses of water a day plus more depending on how much you exercise and sweat.    If you begin to develop dizziness, chest pain, abdominal pain, jaw pain, shortness of breath, headache, vision changes, lightheadedness, or other concerning symptoms, stop the activity and allow your body to rest. If your symptoms are severe, seek emergency evaluation immediately. If your symptoms are concerning, but not severe, please let us know so that we can recommend further evaluation.

## 2021-07-15 NOTE — Progress Notes (Signed)
Amanda Render, DNP, AGNP-c [facility] [address]   [place], [place] [address] [phone]  New patient visit   Patient: Amanda [name]   DOB: [birth year]   66 y.o. Female  MRN: [id number] Visit Date: 07/15/2021  Patient Care Team: Khoa Opdahl, Coralee Pesa, NP as PCP - General (Nurse Practitioner) Lelon Perla, MD as PCP - Cardiology (Cardiology)  Today's healthcare provider: Orma Render, NP   Chief Complaint  Patient presents with   New Patient (Initial Visit)    Patient presents today to establish care, she was seeing Dr Maudie Mercury whom is no longer practicing due to illness. She would like a full panel of lab work, since it has been awhile. She would like a referral to dermatology. She would like to discuss medical marijuana. She is current on mammogram and paps ( Dr Hillis Range)    Subjective    Amanda [name] is a 66 y.o. female who presents today as a new patient to establish care.  Burnetta is married and she tells me she feels safe at home and in her relationship.  She does not drink alcohol.  She does use marijuana for pain management.  She has been smoking 1 pack a day for approximately 45 years.  She does not wish to quit at this time.   Patient endorses the following concerns presently: Dermatology-  Endorses itching "all over" that has been present for a long time. She tells me that she has the sensation to scratch almost constantly. 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 She is using betamethasone from a previous provider which provides relief. She is using this once or twice a day in small quantities. She would like a refill on this.   Medical Marijuana- She endorses marijuana use to help with chronic pain from degenerative joint disease. She endorses pain in her large joints, hands, and feet that has been present for years. She is on suboxone therapy for pain management. She tells me the  marijuana is helpful and would like to know if this could be prescribed.   Bipolar D/O- She endorses a hx of bipolar d/o and is currently managed by psychiatry. She feels her mood is stable at this time and has no concerns with exacerbations.   History reviewed and reveals the following: Past Medical History:  Diagnosis Date   Atrial fibrillation ([place])    DEGENERATIVE JOINT DISEASE 11/12/2006   DISORDER, BIPOLAR NOS 02/28/2007   GERD 11/12/2006   Major depressive disorder, single episode, unspecified 07/15/2021   Past Surgical History:  Procedure Laterality Date   ABDOMINAL HYSTERECTOMY     BREAST SURGERY     reduction   KNEE ARTHROSCOPY     TONSILLECTOMY     Family Status  Relation Name Status   Mother  Deceased   Father  Deceased   MGM  Deceased   Mat Aunt  Deceased   Mat Aunt  Alive   Family History  Problem Relation Age of Onset   Dementia Mother    Congestive Heart Failure Father    Dementia Maternal Grandmother    Dementia Maternal Aunt    Dementia Maternal Aunt    Social History   Socioeconomic History   Marital status: Married    Spouse name: Dominica Severin   Number of children: 1   Years of education: Not on file   Highest education level: Not on file  Occupational History  Not on file  Tobacco Use   Smoking status: Every Day    Packs/day: 1.00    Years: 45.00    Pack years: 45.00    Types: Cigarettes   Smokeless tobacco: Never  Vaping Use   Vaping Use: Never used  Substance and Sexual Activity   Alcohol use: No   Drug use: Yes    Frequency: 7.0 times per week    Types: Marijuana    Comment: daily marijuana use   Sexual activity: Not Currently    Partners: Male  Other Topics Concern   Not on file  Social History Narrative   Not on file   Social Determinants of Health   Financial Resource Strain: Not on file  Food Insecurity: Not on file  Transportation Needs: Not on file  Physical Activity: Not on file  Stress: Not on file  Social  Connections: Not on file   Outpatient Medications Prior to Visit  Medication Sig   albuterol (VENTOLIN HFA) 108 (90 Base) MCG/ACT inhaler Inhale 2 puffs into the lungs in the morning and at bedtime.   augmented betamethasone dipropionate (DIPROLENE-AF) 0.05 % cream Apply 1 application topically 2 (two) times daily as needed (itching).   Buprenorphine HCl-Naloxone HCl 1.4-0.36 MG SUBL Place 1-2 tablets under the tongue as directed. Take 2 tablets in the morning and Take 1 tablet in afternoon   citalopram (CELEXA) 20 MG tablet TAKE 1 TABLET (20 MG TOTAL) BY MOUTH AT BEDTIME.   diltiazem (CARDIZEM CD) 120 MG 24 hr capsule TAKE 1 CAPSULE (120 MG TOTAL) BY MOUTH DAILY.   ezetimibe (ZETIA) 10 MG tablet Take 10 mg by mouth daily.   gabapentin (NEURONTIN) 100 MG capsule Take 1 capsule (100 mg total) by mouth at bedtime.   lamoTRIgine (LAMICTAL) 100 MG tablet TAKE 1 TABLET (100 MG TOTAL) BY MOUTH DAILY.   risperiDONE (RISPERDAL) 1 MG tablet Take 1 tablet (1 mg total) by mouth at bedtime.   [DISCONTINUED] apixaban (ELIQUIS) 5 MG TABS tablet Take 1 tablet (5 mg total) by mouth 2 (two) times daily.   [DISCONTINUED] clotrimazole-betamethasone (LOTRISONE) cream APPLY TOPICALLY TWICE DAILY   [DISCONTINUED] metoprolol tartrate (LOPRESSOR) 50 MG tablet TAKE 2 HOURS PRIOR TO CT SCAN   [DISCONTINUED] ondansetron (ZOFRAN-ODT) 4 MG disintegrating tablet Take 2 mg by mouth every 6 (six) hours as needed for nausea or vomiting.   [DISCONTINUED] STIOLTO RESPIMAT 2.5-2.5 MCG/ACT AERS Inhale into the lungs.   [DISCONTINUED] vitamin C (ASCORBIC ACID) 500 MG tablet Take 1,000 mg by mouth daily.   No facility-administered medications prior to visit.   Allergies  Allergen Reactions   Augmentin [Amoxicillin-Pot Clavulanate] Other (See Comments)    Has patient had a PCN reaction causing immediate rash, facial/tongue/throat swelling, SOB or lightheadedness with hypotension: No Has patient had a PCN reaction causing severe  rash involving mucus membranes or skin necrosis: No Has patient had a PCN reaction that required hospitalization: No Has patient had a PCN reaction occurring within the last 10 years: No If all of the above answers are "NO", then may proceed with Cephalosporin use.    Doxycycline     Other reaction(s): Paralysis left side   Sumatriptan Other (See Comments)   Doxycycline Hyclate Hives and Rash    REACTION: rash / hives   Immunization History  Administered Date(s) Administered   Influenza, Quadrivalent, Recombinant, Inj, Pf 04/15/2017, 02/17/2018, 03/30/2019   Influenza,inj,quad, With Preservative 05/08/2017   Influenza-Unspecified 04/19/2012, 02/28/2013, 03/11/2015, 05/10/2016, 04/02/2019, 03/08/2021   PFIZER(Purple Top)SARS-COV-2 Vaccination  05/16/2020   Td 06/08/2002   Tdap 04/15/2017    Health Maintenance Due: Health Maintenance  Topic Date Due   Zoster Vaccines- Shingrix (1 of 2) Never done   PAP SMEAR-Modifier  Never done   COLONOSCOPY (Pts 45-54yrs Insurance coverage will need to be confirmed)  Never done   MAMMOGRAM  Never done   COVID-19 Vaccine (2 - Pfizer risk series) 06/06/2020   TETANUS/TDAP  04/16/2027   INFLUENZA VACCINE  Completed   HIV Screening  Completed   HPV VACCINES  Aged Out   Hepatitis C Screening  Discontinued    Review of Systems All review of systems negative except what is listed in the HPI   Objective    BP 128/82    Pulse 99    Ht $R'5\' 4"'ha$  (1.626 m)    Wt 167 lb (75.8 kg)    SpO2 (!) 64%    BMI 28.67 kg/m  Physical Exam Vitals and nursing note reviewed.  Constitutional:      General: She is not in acute distress.    Appearance: Normal appearance.  Eyes:     Extraocular Movements: Extraocular movements intact.     Conjunctiva/sclera: Conjunctivae normal.     Pupils: Pupils are equal, round, and reactive to light.  Neck:     Vascular: No carotid bruit.  Cardiovascular:     Rate and Rhythm: Normal rate and regular rhythm.     Pulses:  Normal pulses.     Heart sounds: Normal heart sounds. No murmur heard. Pulmonary:     Effort: Pulmonary effort is normal.     Breath sounds: Normal breath sounds. No wheezing.  Abdominal:     General: Bowel sounds are normal.     Palpations: Abdomen is soft.  Musculoskeletal:        General: Normal range of motion.     Cervical back: Normal range of motion.     Right lower leg: No edema.     Left lower leg: No edema.  Skin:    General: Skin is warm and dry.     Capillary Refill: Capillary refill takes less than 2 seconds.     Findings: No lesion or rash.  Neurological:     General: No focal deficit present.     Mental Status: She is alert and oriented to person, place, and time.  Psychiatric:        Mood and Affect: Mood normal.        Behavior: Behavior normal.        Thought Content: Thought content normal.        Judgment: Judgment normal.    Results for orders placed or performed in visit on 07/15/21  CBC with Differential/Platelet  Result Value Ref Range   WBC 10.1 3.4 - 10.8 x10E3/uL   RBC 5.14 3.77 - 5.28 x10E6/uL   Hemoglobin 14.8 11.1 - 15.9 g/dL   Hematocrit 43.9 34.0 - 46.6 %   MCV 85 79 - 97 fL   MCH 28.8 26.6 - 33.0 pg   MCHC 33.7 31.5 - 35.7 g/dL   RDW 12.4 11.7 - 15.4 %   Platelets 274 150 - 450 x10E3/uL   Neutrophils 66 Not Estab. %   Lymphs 24 Not Estab. %   Monocytes 7 Not Estab. %   Eos 1 Not Estab. %   Basos 1 Not Estab. %   Neutrophils Absolute 6.9 1.4 - 7.0 x10E3/uL   Lymphocytes Absolute 2.4 0.7 - 3.1 x10E3/uL   Monocytes  Absolute 0.7 0.1 - 0.9 x10E3/uL   EOS (ABSOLUTE) 0.1 0.0 - 0.4 x10E3/uL   Basophils Absolute 0.1 0.0 - 0.2 x10E3/uL   Immature Granulocytes 1 Not Estab. %   Immature Grans (Abs) 0.1 0.0 - 0.1 x10E3/uL  Comprehensive metabolic panel  Result Value Ref Range   Glucose 78 70 - 99 mg/dL   BUN 19 8 - 27 mg/dL   Creatinine, Ser 0.86 0.57 - 1.00 mg/dL   eGFR 76 >59 mL/min/1.73   BUN/Creatinine Ratio 22 12 - 28   Sodium 140 134  - 144 mmol/L   Potassium 4.7 3.5 - 5.2 mmol/L   Chloride 101 96 - 106 mmol/L   CO2 24 20 - 29 mmol/L   Calcium 9.2 8.7 - 10.3 mg/dL   Total Protein 7.3 6.0 - 8.5 g/dL   Albumin 4.6 3.8 - 4.8 g/dL   Globulin, Total 2.7 1.5 - 4.5 g/dL   Albumin/Globulin Ratio 1.7 1.2 - 2.2   Bilirubin Total <0.2 0.0 - 1.2 mg/dL   Alkaline Phosphatase 90 44 - 121 IU/L   AST 18 0 - 40 IU/L   ALT 10 0 - 32 IU/L  Lipid panel  Result Value Ref Range   Cholesterol, Total 206 (H) 100 - 199 mg/dL   Triglycerides 126 0 - 149 mg/dL   HDL 43 >39 mg/dL   VLDL Cholesterol Cal 23 5 - 40 mg/dL   LDL Chol Calc (NIH) 140 (H) 0 - 99 mg/dL   Chol/HDL Ratio 4.8 (H) 0.0 - 4.4 ratio  TSH  Result Value Ref Range   TSH 2.270 0.450 - 4.500 uIU/mL    Assessment & Plan      Problem List Items Addressed This Visit     Bipolar disorder ([place])    Bipolar disorder currently managed with psychiatry. No alarm symptoms present today PHQ and GAD-7 negative today. We will continue to monitor. Recommend continuation of all medications until advised by behavioral health.      Osteoarthritis    History of degenerative joint disease. She is currently on Suboxone for previous opioid dependence and pain relief. Recommend use of Tylenol or ibuprofen for daily pain relief and continuation of gabapentin. May consider use of diclofenac gel to specific joints. Recommend increase daily activity with walking at least 30 minutes a day which can be helpful.       Atrial fibrillation ([facility])    Personal history of atrial fibrillation with recent cardioversion.  At this time she is in normal sinus rhythm and has not had any difficulties since the cardioversion.  She is not currently on any anticoagulants.  She completed her Eliquis approximately 2 weeks ago and reports she has felt good. No signs of bleeding or alarm symptoms present today. Recommend monitoring for recurrent atrial fibrillation and contacting cardiology if any new symptoms  develop.      Chronic pain    Chronic pain management currently with Emelia Loron. She is currently taking Suboxone and using marijuana to help with the pain. Discussed with patient the medical marijuana is not legalized in [place] therefore we are unable to prescribe for this.   If patient chooses to continue with marijuana use for pain relief recommend cautious use due to lack of regulation in packaging and current trend of mixing other drugs with marijuana. Discussed with patient that CBD oil may be a safer alternative that she could consider, although this is not regulated as well and not a recommended treatment.  Relevant Orders   CBC with Differential/Platelet (Completed)   Comprehensive metabolic panel (Completed)   Lipid panel (Completed)   TSH (Completed)   Hyperlipidemia, unspecified    Currently taking Zetia daily. We will monitor labs today make changes to plan of care as necessary based on results.      Relevant Orders   CBC with Differential/Platelet (Completed)   Comprehensive metabolic panel (Completed)   Lipid panel (Completed)   TSH (Completed)   Hypertension    History of hypertension fairly well controlled today. No alarm symptoms present. Recommend monitoring for new signs and symptoms and checking blood pressure at home once a week to ensure that levels do not go above 140/85.       Hypothyroid    History of hypothyroidism listed however patient not on any medication at this time. We will obtain labs today for further evaluation and make recommendations for changes to plan of care as necessary.      Prediabetes    History of prediabetes.  Not currently on any medications or dietary regulations. We will monitor labs today for further evaluation.      Pruritic disorder - Primary    Pruritic disorder of unknown etiology. Advised patient to use betamethasone as little as possible and recommend moisturizing cream to the skin daily, increase  hydration, avoiding daily or hot showers to prevent drying of the skin.  We will send referral to dermatology for complete evaluation at patient's request.      Relevant Orders   Ambulatory referral to Dermatology   TSH (Completed)   Numerous skin moles    Patient endorses numerous moles on the skin which she would like to have evaluated by dermatology. She has seen dermatology in the past however would like a new provider at this time. We will send referral today for complete evaluation.      Relevant Orders   Ambulatory referral to Dermatology   Other long term (current) drug therapy   Relevant Orders   CBC with Differential/Platelet (Completed)   Comprehensive metabolic panel (Completed)   Lipid panel (Completed)   TSH (Completed)   Other Visit Diagnoses     Encounter for medical examination to establish care            Return in about 1 year (around 07/15/2022).     Time: 55 minutes, >50% spent counseling, care coordination, chart review, and documentation.    Jamekia Gannett, Coralee Pesa, NP, DNP, AGNP-C [facility]

## 2021-07-16 LAB — CBC WITH DIFFERENTIAL/PLATELET
Basophils Absolute: 0.1 10*3/uL (ref 0.0–0.2)
Basos: 1 %
EOS (ABSOLUTE): 0.1 10*3/uL (ref 0.0–0.4)
Eos: 1 %
Hematocrit: 43.9 % (ref 34.0–46.6)
Hemoglobin: 14.8 g/dL (ref 11.1–15.9)
Immature Grans (Abs): 0.1 10*3/uL (ref 0.0–0.1)
Immature Granulocytes: 1 %
Lymphocytes Absolute: 2.4 10*3/uL (ref 0.7–3.1)
Lymphs: 24 %
MCH: 28.8 pg (ref 26.6–33.0)
MCHC: 33.7 g/dL (ref 31.5–35.7)
MCV: 85 fL (ref 79–97)
Monocytes Absolute: 0.7 10*3/uL (ref 0.1–0.9)
Monocytes: 7 %
Neutrophils Absolute: 6.9 10*3/uL (ref 1.4–7.0)
Neutrophils: 66 %
Platelets: 274 10*3/uL (ref 150–450)
RBC: 5.14 x10E6/uL (ref 3.77–5.28)
RDW: 12.4 % (ref 11.7–15.4)
WBC: 10.1 10*3/uL (ref 3.4–10.8)

## 2021-07-16 LAB — LIPID PANEL
Chol/HDL Ratio: 4.8 ratio — ABNORMAL HIGH (ref 0.0–4.4)
Cholesterol, Total: 206 mg/dL — ABNORMAL HIGH (ref 100–199)
HDL: 43 mg/dL (ref >39)
LDL Chol Calc (NIH): 140 mg/dL — ABNORMAL HIGH (ref 0–99)
Triglycerides: 126 mg/dL (ref 0–149)
VLDL Cholesterol Cal: 23 mg/dL (ref 5–40)

## 2021-07-16 LAB — COMPREHENSIVE METABOLIC PANEL
ALT: 10 IU/L (ref 0–32)
AST: 18 IU/L (ref 0–40)
Albumin/Globulin Ratio: 1.7 (ref 1.2–2.2)
Albumin: 4.6 g/dL (ref 3.8–4.8)
Alkaline Phosphatase: 90 IU/L (ref 44–121)
BUN/Creatinine Ratio: 22 (ref 12–28)
BUN: 19 mg/dL (ref 8–27)
Bilirubin Total: <0.2 mg/dL (ref 0.0–1.2)
CO2: 24 mmol/L (ref 20–29)
Calcium: 9.2 mg/dL (ref 8.7–10.3)
Chloride: 101 mmol/L (ref 96–106)
Creatinine, Ser: 0.86 mg/dL (ref 0.57–1.00)
Globulin, Total: 2.7 g/dL (ref 1.5–4.5)
Glucose: 78 mg/dL (ref 70–99)
Potassium: 4.7 mmol/L (ref 3.5–5.2)
Sodium: 140 mmol/L (ref 134–144)
Total Protein: 7.3 g/dL (ref 6.0–8.5)
eGFR: 76 mL/min/{1.73_m2} (ref >59)

## 2021-07-16 LAB — TSH: TSH: 2.27 u[IU]/mL (ref 0.450–4.500)

## 2021-07-17 ENCOUNTER — Encounter (HOSPITAL_BASED_OUTPATIENT_CLINIC_OR_DEPARTMENT_OTHER): Payer: Self-pay | Admitting: Nurse Practitioner

## 2021-07-17 DIAGNOSIS — L299 Pruritus, unspecified: Secondary | ICD-10-CM | POA: Insufficient documentation

## 2021-07-17 DIAGNOSIS — D229 Melanocytic nevi, unspecified: Secondary | ICD-10-CM | POA: Insufficient documentation

## 2021-07-17 NOTE — Assessment & Plan Note (Signed)
History of prediabetes.  Not currently on any medications or dietary regulations. We will monitor labs today for further evaluation.

## 2021-07-17 NOTE — Assessment & Plan Note (Signed)
Pruritic disorder of unknown etiology. Advised patient to use betamethasone as little as possible and recommend moisturizing cream to the skin daily, increase hydration, avoiding daily or hot showers to prevent drying of the skin.  We will send referral to dermatology for complete evaluation at patient's request.

## 2021-07-17 NOTE — Assessment & Plan Note (Signed)
Patient endorses numerous moles on the skin which she would like to have evaluated by dermatology. She has seen dermatology in the past however would like a new provider at this time. We will send referral today for complete evaluation.

## 2021-07-17 NOTE — Assessment & Plan Note (Signed)
Currently taking Zetia daily. We will monitor labs today make changes to plan of care as necessary based on results.

## 2021-07-17 NOTE — Assessment & Plan Note (Signed)
Personal history of atrial fibrillation with recent cardioversion.  At this time she is in normal sinus rhythm and has not had any difficulties since the cardioversion.  She is not currently on any anticoagulants.  She completed her Eliquis approximately 2 weeks ago and reports she has felt good. No signs of bleeding or alarm symptoms present today. Recommend monitoring for recurrent atrial fibrillation and contacting cardiology if any new symptoms develop.

## 2021-07-17 NOTE — Assessment & Plan Note (Signed)
History of hypertension fairly well controlled today. No alarm symptoms present. Recommend monitoring for new signs and symptoms and checking blood pressure at home once a week to ensure that levels do not go above 140/85.

## 2021-07-17 NOTE — Assessment & Plan Note (Signed)
History of degenerative joint disease. She is currently on Suboxone for previous opioid dependence and pain relief. Recommend use of Tylenol or ibuprofen for daily pain relief and continuation of gabapentin. May consider use of diclofenac gel to specific joints. Recommend increase daily activity with walking at least 30 minutes a day which can be helpful.

## 2021-07-17 NOTE — Assessment & Plan Note (Addendum)
Chronic pain management currently with Amanda [name]. She is currently taking Suboxone and using marijuana to help with the pain. Discussed with patient the medical marijuana is not legalized in [place] therefore we are unable to prescribe for this.   If patient chooses to continue with marijuana use for pain relief recommend cautious use due to lack of regulation in packaging and current trend of mixing other drugs with marijuana. Discussed with patient that CBD oil may be a safer alternative that she could consider, although this is not regulated as well and not a recommended treatment.

## 2021-07-17 NOTE — Assessment & Plan Note (Signed)
History of hypothyroidism listed however patient not on any medication at this time. We will obtain labs today for further evaluation and make recommendations for changes to plan of care as necessary.

## 2021-07-17 NOTE — Assessment & Plan Note (Signed)
Bipolar disorder currently managed with psychiatry. No alarm symptoms present today PHQ and GAD-7 negative today. We will continue to monitor. Recommend continuation of all medications until advised by behavioral health.

## 2021-07-29 ENCOUNTER — Encounter (HOSPITAL_BASED_OUTPATIENT_CLINIC_OR_DEPARTMENT_OTHER): Payer: Self-pay | Admitting: Nurse Practitioner

## 2021-07-29 DIAGNOSIS — D229 Melanocytic nevi, unspecified: Secondary | ICD-10-CM

## 2021-08-11 ENCOUNTER — Telehealth: Payer: Self-pay | Admitting: Dermatology

## 2021-08-11 NOTE — Telephone Encounter (Signed)
Patient is calling for a referral appointment from Jacolyn Reedy, NP.  Patient is scheduled for 03/18/2022 at 11:00 with Lavonna Monarch, M.D. ?

## 2021-08-11 NOTE — Telephone Encounter (Signed)
Referral attached to appointment

## 2021-09-16 ENCOUNTER — Encounter (HOSPITAL_BASED_OUTPATIENT_CLINIC_OR_DEPARTMENT_OTHER): Payer: Self-pay | Admitting: Obstetrics and Gynecology

## 2021-09-16 ENCOUNTER — Emergency Department (HOSPITAL_BASED_OUTPATIENT_CLINIC_OR_DEPARTMENT_OTHER): Payer: Medicare HMO | Admitting: Radiology

## 2021-09-16 ENCOUNTER — Emergency Department (HOSPITAL_BASED_OUTPATIENT_CLINIC_OR_DEPARTMENT_OTHER)
Admission: EM | Admit: 2021-09-16 | Discharge: 2021-09-16 | Disposition: A | Discharge status: Home / Self Care | Payer: Medicare HMO | Attending: Emergency Medicine | Admitting: Emergency Medicine

## 2021-09-16 ENCOUNTER — Other Ambulatory Visit: Payer: Self-pay

## 2021-09-16 DIAGNOSIS — I10 Essential (primary) hypertension: Secondary | ICD-10-CM | POA: Diagnosis not present

## 2021-09-16 DIAGNOSIS — I480 Paroxysmal atrial fibrillation: Secondary | ICD-10-CM | POA: Insufficient documentation

## 2021-09-16 DIAGNOSIS — Z79899 Other long term (current) drug therapy: Secondary | ICD-10-CM | POA: Insufficient documentation

## 2021-09-16 LAB — BASIC METABOLIC PANEL
Anion gap: 9 (ref 5–15)
BUN: 24 mg/dL — ABNORMAL HIGH (ref 8–23)
CO2: 27 mmol/L (ref 22–32)
Calcium: 9.2 mg/dL (ref 8.9–10.3)
Chloride: 105 mmol/L (ref 98–111)
Creatinine, Ser: 0.8 mg/dL (ref 0.44–1.00)
GFR, Estimated: >60 mL/min (ref >60)
Glucose, Bld: 101 mg/dL — ABNORMAL HIGH (ref 70–99)
Potassium: 4.5 mmol/L (ref 3.5–5.1)
Sodium: 141 mmol/L (ref 135–145)

## 2021-09-16 LAB — CBC
HCT: 47.6 % — ABNORMAL HIGH (ref 36.0–46.0)
Hemoglobin: 15 g/dL (ref 12.0–15.0)
MCH: 27.8 pg (ref 26.0–34.0)
MCHC: 31.5 g/dL (ref 30.0–36.0)
MCV: 88.1 fL (ref 80.0–100.0)
Platelets: 274 10*3/uL (ref 150–400)
RBC: 5.4 MIL/uL — ABNORMAL HIGH (ref 3.87–5.11)
RDW: 13.3 % (ref 11.5–15.5)
WBC: 9.3 10*3/uL (ref 4.0–10.5)
nRBC: 0 % (ref 0.0–0.2)

## 2021-09-16 LAB — TROPONIN I (HIGH SENSITIVITY): Troponin I (High Sensitivity): 3 ng/L (ref <18)

## 2021-09-16 MED ORDER — ACETAMINOPHEN 325 MG PO TABS: 650.0000 mg | ORAL_TABLET | Freq: Once | ORAL | Status: DC

## 2021-09-16 NOTE — Discharge Instructions (Addendum)
Dr. Harrington Challenger has attached the address and contact number for the A-fib clinic.  It is in the main [facility]. ?

## 2021-09-16 NOTE — ED Provider Notes (Signed)
?[facility] ?Provider Note ? ? ?CSN: [id number] ?Arrival date & time: 09/16/21  1157 ? ?  ? ?History ? ?Chief Complaint  ?Patient presents with  ? Atrial Fibrillation  ? ? ?Amanda [name] is a 65 y.o. female with a past medical history of hypertension and paroxysmal A-fib presenting today with complaint of A-fib.  She reports that she felt herself go into it at 3 PM yesterday. She says that she feels palpitations and her heart "jumping" and is confident it started yesterday afternoon. She is on cardizem but is not anticoagulating, stating she did not want to be on Eliquis. Also admits that she no longer takes the baby aspirin. Has not notified her cardiologist and says that she comes into the ED each time to "get out of the rhythm." ? ? ?Atrial Fibrillation ?Pertinent negatives include no chest pain and no shortness of breath.  ? ?  ? ?Home Medications ?Prior to Admission medications   ?Medication Sig Start Date End Date Taking? Authorizing Provider  ?albuterol (VENTOLIN HFA) 108 (90 Base) MCG/ACT inhaler Inhale 2 puffs into the lungs in the morning and at bedtime. 05/08/21   [provider]  ?augmented betamethasone dipropionate (DIPROLENE-AF) 0.05 % cream Apply 1 application topically 2 (two) times daily as needed (itching). 04/07/21   [provider]  ?Buprenorphine HCl-Naloxone HCl 1.4-0.36 MG SUBL Place 1-2 tablets under the tongue as directed. Take 2 tablets in the morning and Take 1 tablet in afternoon    [provider]  ?citalopram (CELEXA) 20 MG tablet TAKE 1 TABLET (20 MG TOTAL) BY MOUTH AT BEDTIME. 04/01/21   Hurst, Helene Kelp T, PA-C  ?diltiazem (CARDIZEM CD) 120 MG 24 hr capsule TAKE 1 CAPSULE (120 MG TOTAL) BY MOUTH DAILY. 04/28/21   Lelon Perla, MD  ?ezetimibe (ZETIA) 10 MG tablet Take 10 mg by mouth daily.    [provider]  ?gabapentin (NEURONTIN) 100 MG capsule Take 1 capsule (100 mg total) by mouth at bedtime. 03/11/21   Donnal Moat T, PA-C  ?lamoTRIgine (LAMICTAL) 100 MG tablet TAKE 1 TABLET (100 MG TOTAL) BY MOUTH DAILY. 04/01/21   Donnal Moat T, PA-C  ?risperiDONE (RISPERDAL) 1 MG tablet Take 1 tablet (1 mg total) by mouth at bedtime. 03/11/21   Addison Lank, PA-C  ?   ? ?Allergies    ?Augmentin [amoxicillin-pot clavulanate], Doxycycline, Sumatriptan, and Doxycycline hyclate   ? ?Review of Systems   ?Review of Systems  ?Constitutional:  Positive for fatigue.  ?Respiratory:  Negative for shortness of breath.   ?Cardiovascular:  Positive for palpitations. Negative for chest pain.  ?Neurological:  Negative for dizziness and light-headedness.  ? ?Physical Exam ?Updated Vital Signs ?BP 129/64 (BP Location: Right Arm)   Pulse 65   Temp 99.3 ?F (37.4 ?C)   Resp 17   Ht '5\' 4"'$  (1.626 m)   Wt 78 kg   SpO2 93%   BMI 29.52 kg/m?  ?Physical Exam ?Vitals and nursing note reviewed.  ?Constitutional:   ?   Appearance: Normal appearance.  ?HENT:  ?   Head: Normocephalic and atraumatic.  ?Eyes:  ?   General: No scleral icterus. ?   Conjunctiva/sclera: Conjunctivae normal.  ?Cardiovascular:  ?   Rate and Rhythm: Normal rate. Rhythm irregular.  ?Pulmonary:  ?   Effort: Pulmonary effort is normal. No respiratory distress.  ?   Breath sounds: No wheezing.  ?Skin: ?   Findings: No rash.  ?Neurological:  ?   Mental  Status: She is alert.  ?Psychiatric:     ?   Mood and Affect: Mood normal.  ? ? ?ED Results / Procedures / Treatments   ?Labs ?(all labs ordered are listed, but only abnormal results are displayed) ?Labs Reviewed  ?BASIC METABOLIC PANEL - Abnormal; Notable for the following components:  ?    Result Value  ? Glucose, Bld 101 (*)   ? BUN 24 (*)   ? All other components within normal limits  ?CBC - Abnormal; Notable for the following components:  ? RBC 5.40 (*)   ? HCT 47.6 (*)   ? All other components within normal limits  ?TROPONIN I (HIGH SENSITIVITY)  ?TROPONIN I (HIGH SENSITIVITY)  ? ? ?EKG ?EKG Interpretation ? ?Date/Time:  Tuesday  September 16 2021 12:20:40 EDT ?Ventricular Rate:  90 ?PR Interval:    ?QRS Duration: 74 ?QT Interval:  362 ?QTC Calculation: 442 ?R Axis:   39 ?Text Interpretation: Atrial fibrillation Abnormal ECG When compared with ECG of 05-Jun-2021 17:39, PREVIOUS ECG IS PRESENT Confirmed by Fredia Sorrow [phone]) on 09/16/2021 2:15:07 PM ? ?Radiology ?DG Chest 2 View ? ?Result Date: 09/16/2021 ?CLINICAL DATA:  Tachycardia shortness of breath for the past 2 days. EXAM: CHEST - 2 VIEW COMPARISON:  Chest x-ray dated June 05, 2021. FINDINGS: The heart size and mediastinal contours are within normal limits. Both lungs are clear. The visualized skeletal structures are unremarkable. IMPRESSION: No active cardiopulmonary disease. Electronically Signed   By: Titus Dubin M.D.   On: 09/16/2021 13:22   ? ?Procedures ?Procedures  ? ?Medications Ordered in ED ?Medications - No data to display ? ?ED Course/ Medical Decision Making/ A&P ?  ?                        ?Medical Decision Making ?Amount and/or Complexity of Data Reviewed ?Labs: ordered. ?Radiology: ordered. ? ? ?Patient presents to the ED for concern of A-fib.  Reports she felt herself going to bed at 3 PM yesterday.  Denies chest pain, palpitations, dizziness or shortness of breath.  Endorsing fatigue.  Does not take aspirin or Eliquis. ? ? ?Per internal/external chart review: Patient initially presented to the ED in 2019 and was found to be in afib. She has had multiple episodes of recurrence but has never been on anticoagulation. She follows with Dr. Stanford Breed.  ? ? ?I performed a full physical exam, pertinent findings include: ?Afib, normal rate ? ?Diagnostics: ? ?I ordered and viewed labs. The pertinent results include:  ?Neg trop ?Normal electrolytes ? ?I ordered and individually viewed patient's chest x-ray which is negative ? ? ?Cardiac Monitoring: ? ?The patient was maintained on a cardiac monitor.  I personally viewed and interpreted the cardiac monitored which showed  A-fib with a normal rate ? ?Consultations Obtained: ? ?I spoke with Dr. Harrington Challenger with cardiology who recommends follow up in afib clinic tomorrow. ? ? ?MDM/Disposition: ? ?I reevaluated the patient and found that they continue to be in afib but with a normal rate. I considered cardioversion however I spoke with cardiology who did not advice cardioversion today. Patient has a chadsvasc score of 1, solely due to being a female, additionally she is not interested in blood thinners. At this time, patient is stable and will be discharged for follow-up at 11am in the afib clinic with Dr. Harrington Challenger. She and her husband are agreeable.  ? ? ?Final Clinical Impression(s) / ED Diagnoses ?Final diagnoses:  ?Paroxysmal atrial fibrillation ([place])  ? ? ?  Rx / DC Orders ?Results and diagnoses were explained to the patient. Return precautions discussed in full. Patient had no additional questions and expressed complete understanding. ? ? ?This chart was dictated using voice recognition software.  Despite best efforts to proofread,  errors can occur which can change the documentation meaning.  ?  ?Rhae Hammock, PA-C ?09/17/21 1643 ? ?  ?Fredia Sorrow, MD ?09/18/21 [phone] ? ?

## 2021-09-16 NOTE — ED Triage Notes (Signed)
Patient reports she is in A-fib and can feel it. Patient reports some increased fatigue ?

## 2021-09-17 ENCOUNTER — Encounter (HOSPITAL_COMMUNITY): Payer: Self-pay | Admitting: Nurse Practitioner

## 2021-09-17 ENCOUNTER — Other Ambulatory Visit: Payer: Self-pay

## 2021-09-17 ENCOUNTER — Ambulatory Visit (HOSPITAL_COMMUNITY)
Admit: 2021-09-17 | Discharge: 2021-09-17 | Disposition: A | Discharge status: Home / Self Care | Payer: Medicare HMO | Attending: Nurse Practitioner | Admitting: Nurse Practitioner

## 2021-09-17 VITALS — BP 154/64 | HR 69 | Ht 64.0 in | Wt 171.4 lb

## 2021-09-17 DIAGNOSIS — R0681 Apnea, not elsewhere classified: Secondary | ICD-10-CM | POA: Diagnosis not present

## 2021-09-17 DIAGNOSIS — F17210 Nicotine dependence, cigarettes, uncomplicated: Secondary | ICD-10-CM | POA: Diagnosis not present

## 2021-09-17 DIAGNOSIS — Z79899 Other long term (current) drug therapy: Secondary | ICD-10-CM | POA: Insufficient documentation

## 2021-09-17 DIAGNOSIS — I480 Paroxysmal atrial fibrillation: Secondary | ICD-10-CM | POA: Diagnosis present

## 2021-09-17 MED ORDER — DILTIAZEM HCL 30 MG PO TABS: ORAL_TABLET | ORAL | 1 refills | Status: DC

## 2021-09-17 NOTE — Patient Instructions (Signed)
Cardizem '30mg'$  -- take 1 tablet every 4 hours AS NEEDED for heart rate >90 as long as top number of blood pressure >100.   ?

## 2021-09-17 NOTE — Progress Notes (Signed)
? ?Primary Care Physician: Orma Render, NP ?Referring Physician: Dr. Dorris Carnes ?Cardiologist: Dr. Stanford Breed  ? ? ?Amanda [name] is a 66 y.o. female with a h/o  paroxysmal afib that was seen in the ER yesterday with controlled v rates. ER staff opted not to cardiovert her. They spoke to Dr. Harrington Challenger and she recommended f/u in the afib clinic today.  ?Pt was d/c in rate controlled afib but fortunately has returned to [place] today. Pt is unaware of when she went back to [facility]. ?She reports that she snores, apnea noted and sleeps in a recliner because of this.  No previous sleep study. She does smoke a pack a day for years, minimal caffeine use and denies alcohol use. She currently takes 120 mg diltiazem daily. She is not on anticoagulation for a CHA2DS2VASc  score of 1-2. Pt denies HTN as a medical problem.   ? ?Today, she denies symptoms of palpitations, chest pain, shortness of breath, orthopnea, PND, lower extremity edema, dizziness, presyncope, syncope, or neurologic sequela. The patient is tolerating medications without difficulties and is otherwise without complaint today.  ? ?Past Medical History:  ?Diagnosis Date  ? Atrial fibrillation ([place])   ? DEGENERATIVE JOINT DISEASE 11/12/2006  ? DISORDER, BIPOLAR NOS 02/28/2007  ? GERD 11/12/2006  ? Major depressive disorder, single episode, unspecified 07/15/2021  ? ?Past Surgical History:  ?Procedure Laterality Date  ? ABDOMINAL HYSTERECTOMY    ? BREAST SURGERY    ? reduction  ? KNEE ARTHROSCOPY    ? TONSILLECTOMY    ? ? ?Current Outpatient Medications  ?Medication Sig Dispense Refill  ? albuterol (VENTOLIN HFA) 108 (90 Base) MCG/ACT inhaler Inhale 2 puffs into the lungs in the morning and at bedtime.    ? augmented betamethasone dipropionate (DIPROLENE-AF) 0.05 % cream Apply 1 application topically 2 (two) times daily as needed (itching).    ? Buprenorphine HCl-Naloxone HCl 1.4-0.36 MG SUBL Place 1-2 tablets under the tongue as directed. Take 2 tablets in the morning and  Take 1 tablet in afternoon    ? citalopram (CELEXA) 20 MG tablet TAKE 1 TABLET (20 MG TOTAL) BY MOUTH AT BEDTIME. 90 tablet 2  ? diltiazem (CARDIZEM CD) 120 MG 24 hr capsule TAKE 1 CAPSULE (120 MG TOTAL) BY MOUTH DAILY. 90 capsule 3  ? ezetimibe (ZETIA) 10 MG tablet Take 10 mg by mouth daily.    ? gabapentin (NEURONTIN) 100 MG capsule Take 1 capsule (100 mg total) by mouth at bedtime. 90 capsule 2  ? lamoTRIgine (LAMICTAL) 100 MG tablet TAKE 1 TABLET (100 MG TOTAL) BY MOUTH DAILY. 90 tablet 2  ? risperiDONE (RISPERDAL) 1 MG tablet Take 1 tablet (1 mg total) by mouth at bedtime. 90 tablet 2  ? ?No current facility-administered medications for this encounter.  ? ? ?Allergies  ?Allergen Reactions  ? Augmentin [Amoxicillin-Pot Clavulanate] Other (See Comments)  ?  Has patient had a PCN reaction causing immediate rash, facial/tongue/throat swelling, SOB or lightheadedness with hypotension: No ?Has patient had a PCN reaction causing severe rash involving mucus membranes or skin necrosis: No ?Has patient had a PCN reaction that required hospitalization: No ?Has patient had a PCN reaction occurring within the last 10 years: No ?If all of the above answers are "NO", then may proceed with Cephalosporin use. ?  ? Doxycycline   ?  Other reaction(s): Paralysis left side  ? Sumatriptan Other (See Comments)  ? Doxycycline Hyclate Hives and Rash  ?  REACTION: rash / hives  ? ? ?  Social History  ? ?Socioeconomic History  ? Marital status: Married  ?  Spouse name: Dominica Severin  ? Number of children: 1  ? Years of education: Not on file  ? Highest education level: Not on file  ?Occupational History  ? Not on file  ?Tobacco Use  ? Smoking status: Every Day  ?  Packs/day: 1.00  ?  Years: 45.00  ?  Pack years: 45.00  ?  Types: Cigarettes  ? Smokeless tobacco: Never  ?Vaping Use  ? Vaping Use: Never used  ?Substance and Sexual Activity  ? Alcohol use: No  ? Drug use: Yes  ?  Frequency: 7.0 times per week  ?  Types: Marijuana  ?  Comment: daily  marijuana use  ? Sexual activity: Not Currently  ?  Partners: Male  ?Other Topics Concern  ? Not on file  ?Social History Narrative  ? Not on file  ? ?Social Determinants of Health  ? ?Financial Resource Strain: Not on file  ?Food Insecurity: Not on file  ?Transportation Needs: Not on file  ?Physical Activity: Not on file  ?Stress: Not on file  ?Social Connections: Not on file  ?Intimate Partner Violence: Not on file  ? ? ?Family History  ?Problem Relation Age of Onset  ? Dementia Mother   ? Congestive Heart Failure Father   ? Dementia Maternal Grandmother   ? Dementia Maternal Aunt   ? Dementia Maternal Aunt   ? ? ?ROS- All systems are reviewed and negative except as per the HPI above ? ?Physical Exam: ?There were no vitals filed for this visit. ?Wt Readings from Last 3 Encounters:  ?09/16/21 78 kg  ?07/15/21 75.8 kg  ?06/10/21 77.9 kg  ? ? ?Labs: ?Lab Results  ?Component Value Date  ? NA 141 09/16/2021  ? K 4.5 09/16/2021  ? CL 105 09/16/2021  ? CO2 27 09/16/2021  ? GLUCOSE 101 (H) 09/16/2021  ? BUN 24 (H) 09/16/2021  ? CREATININE 0.80 09/16/2021  ? CALCIUM 9.2 09/16/2021  ? MG 2.1 07/03/2017  ? ?No results found for: INR ?Lab Results  ?Component Value Date  ? CHOL 206 (H) 07/15/2021  ? HDL 43 07/15/2021  ? LDLCALC 140 (H) 07/15/2021  ? TRIG 126 07/15/2021  ? ? ? ?GEN- The patient is well appearing, alert and oriented x 3 today.   ?Head- normocephalic, atraumatic ?Eyes-  Sclera clear, conjunctiva pink ?Ears- hearing intact ?Oropharynx- clear ?Neck- supple, no JVP ?Lymph- no cervical lymphadenopathy ?Lungs- Clear to ausculation bilaterally, normal work of breathing ?Heart- Regular rate and rhythm, no murmurs, rubs or gallops, PMI not laterally displaced ?GI- soft, NT, ND, + BS ?Extremities- no clubbing, cyanosis, or edema ?MS- no significant deformity or atrophy ?Skin- no rash or lesion ?Psych- euthymic mood, full affect ?Neuro- strength and sensation are intact ? ?EKG-Vent. rate 69 BPM ?PR interval 152 ms ?QRS  duration 80 ms ?QT/QTcB 400/428 ms ?P-R-T axes 57 24 31 ?Normal sinus rhythm ?Normal ECG ?When compared with ECG of 16-Sep-2021 12:20, ?PREVIOUS ECG IS PRESENT ? ?Echo- 6 2022-1. Technically difficlut echo with poor image quality.  ? 2. Left ventricular ejection fraction, by estimation, is 60 to 65%. The  ?left ventricle has normal function. The left ventricle has no regional  ?wall motion abnormalities. Left ventricular diastolic parameters were  ?normal.  ? 3. Right ventricular systolic function is normal. The right ventricular  ?size is normal.  ? 4. The mitral valve was not well visualized. No evidence of mitral valve  ?regurgitation.  ?  5. The aortic valve was not well visualized. Aortic valve regurgitation  ?is not visualized. No aortic stenosis is present.  ? ?Assessment and Plan:  ?1. Paroxysmal afib ?Burden is low ?Had a successful cardioversion in  the ER last December  ?ER visit yesterday with controlled v rates and d/c in afib to come here today ?No known trigger  ?She is now back in [facility] ?General education re afib and when it would be necessary to go to a ER for treatment vrs trying to treat at home ?Will rx 30 mg Cardizem and given instructions of use to try to convert at home  ?I discussed antiarrythmic's, Multaq or flecainide but pt is on drugs that are qt prolonging, here are the thoughts of Fuller [place], PharmD - ?" [name] on a lot of QTc prolonging meds. Flecainide will interact with celexa and risperidone, multaq is contraindicated with celexa and also interacts with risperidone ? Celexa is one of the most qtc prolonging SSRIs, not sure if that could be changed to something else by PCP? then either option might be ok but would still need close EKG monitoring bc of the interaction with risperidone" ?So for now will avoid antiarrythmic's try the prn cardizem to use if needed ?She has the clinic as a resource now and encouraged for her to call us with any afib concerns ?Sleep study ordered  ?Tobacco  use discouraged ?Exercise and weight loss encouraged ? ?2. CHA2DS2VASc  score of 1-2 ( pt denies BP dx)  ?Per guidelines does not require anticoagulation at this time  ? ?I will see back in 4-6 weeks  ? ?Butch Penny

## 2021-09-19 ENCOUNTER — Telehealth: Payer: Self-pay | Admitting: *Deleted

## 2021-09-19 NOTE — Telephone Encounter (Addendum)
Prior Authorization for SPLIT sent to [facility] via web portal. ? Procedure Tracking # [id number] ? ?APPROVED-Oct 11 2021 - Nov 10 2021 ?AUTH# [id number] ?

## 2021-10-29 ENCOUNTER — Ambulatory Visit (HOSPITAL_COMMUNITY)
Admission: RE | Admit: 2021-10-29 | Discharge: 2021-10-29 | Disposition: A | Discharge status: Home / Self Care | Payer: Medicare HMO | Source: Ambulatory Visit | Attending: Nurse Practitioner | Admitting: Nurse Practitioner

## 2021-10-29 ENCOUNTER — Encounter (HOSPITAL_COMMUNITY): Payer: Self-pay | Admitting: Nurse Practitioner

## 2021-10-29 VITALS — BP 128/66 | HR 62 | Ht 64.0 in | Wt 171.0 lb

## 2021-10-29 DIAGNOSIS — F17210 Nicotine dependence, cigarettes, uncomplicated: Secondary | ICD-10-CM | POA: Insufficient documentation

## 2021-10-29 DIAGNOSIS — I480 Paroxysmal atrial fibrillation: Secondary | ICD-10-CM | POA: Diagnosis present

## 2021-10-29 DIAGNOSIS — R0683 Snoring: Secondary | ICD-10-CM | POA: Insufficient documentation

## 2021-10-29 NOTE — Progress Notes (Signed)
Primary Care Physician: Orma Render, NP Referring Physician: Dr. Dorris Carnes Cardiologist: Dr. Bary Leriche Amanda [name] is a 66 y.o. female with a h/o  paroxysmal afib that was seen in the ER yesterday with controlled v rates. ER staff opted not to cardiovert her. They spoke to Dr. Harrington Challenger and she recommended f/u in the afib clinic today.  Pt was d/c in rate controlled afib but fortunately has returned to SR today. Pt is unaware of when she went back to SR.  She reports that she snores, apnea noted and sleeps in a recliner because of this.  No previous sleep study. She does smoke a pack a day for years, minimal caffeine use and denies alcohol use. She currently takes 120 mg diltiazem daily. She is not on anticoagulation for a CHA2DS2VASc  score of 1-2. Pt denies HTN as a medical problem.    F/u in afib clinic 10/29/21. She is in SR and not noted any afib. She decided she does not want to go thru sleep study at this time. Due to interaction with her meds with most  antiarrythmic's, if her afib burden increases,  she will most likely be an ablation candidate.   Today, she denies symptoms of palpitations, chest pain, shortness of breath, orthopnea, PND, lower extremity edema, dizziness, presyncope, syncope, or neurologic sequela. The patient is tolerating medications without difficulties and is otherwise without complaint today.   Past Medical History:  Diagnosis Date   Atrial fibrillation [facility])    DEGENERATIVE JOINT DISEASE 11/12/2006   DISORDER, BIPOLAR NOS 02/28/2007   GERD 11/12/2006   Major depressive disorder, single episode, unspecified 07/15/2021   Past Surgical History:  Procedure Laterality Date   ABDOMINAL HYSTERECTOMY     BREAST SURGERY     reduction   KNEE ARTHROSCOPY     TONSILLECTOMY      Current Outpatient Medications  Medication Sig Dispense Refill   albuterol (VENTOLIN HFA) 108 (90 Base) MCG/ACT inhaler Inhale 2 puffs into the lungs in the morning and at bedtime.      augmented betamethasone dipropionate (DIPROLENE-AF) 0.05 % cream Apply 1 application topically 2 (two) times daily as needed (itching).     baclofen (LIORESAL) 10 MG tablet Take 10 mg by mouth 2 (two) times daily.     Buprenorphine HCl-Naloxone HCl 1.4-0.36 MG SUBL Place 1-2 tablets under the tongue as directed. Take 2 tablets in the morning and Take 1 tablet in afternoon     citalopram (CELEXA) 20 MG tablet TAKE 1 TABLET (20 MG TOTAL) BY MOUTH AT BEDTIME. 90 tablet 2   diltiazem (CARDIZEM CD) 120 MG 24 hr capsule TAKE 1 CAPSULE (120 MG TOTAL) BY MOUTH DAILY. 90 capsule 3   diltiazem (CARDIZEM) 30 MG tablet Take 1 tablet every 4 hours AS NEEDED for heart rate >90 30 tablet 1   ezetimibe (ZETIA) 10 MG tablet Take 10 mg by mouth daily.     gabapentin (NEURONTIN) 100 MG capsule Take 1 capsule (100 mg total) by mouth at bedtime. 90 capsule 2   Ibuprofen (MOTRIN PO) Take 800 mg by mouth 3 (three) times daily.     lamoTRIgine (LAMICTAL) 100 MG tablet TAKE 1 TABLET (100 MG TOTAL) BY MOUTH DAILY. 90 tablet 2   ondansetron (ZOFRAN-ODT) 4 MG disintegrating tablet ondansetron 4 mg disintegrating tablet     risperiDONE (RISPERDAL) 1 MG tablet Take 1 tablet (1 mg total) by mouth at bedtime. 90 tablet 2   triamcinolone cream (KENALOG) 0.1 %  triamcinolone acetonide 0.1 % topical cream (Patient not taking: Reported on 10/29/2021)     No current facility-administered medications for this encounter.    Allergies  Allergen Reactions   Augmentin [Amoxicillin-Pot Clavulanate] Other (See Comments)    Has patient had a PCN reaction causing immediate rash, facial/tongue/throat swelling, SOB or lightheadedness with hypotension: No Has patient had a PCN reaction causing severe rash involving mucus membranes or skin necrosis: No Has patient had a PCN reaction that required hospitalization: No Has patient had a PCN reaction occurring within the last 10 years: No If all of the above answers are "NO", then may proceed  with Cephalosporin use.    Doxycycline     Other reaction(s): Paralysis left side   Sumatriptan Other (See Comments)   Doxycycline Hyclate Hives and Rash    REACTION: rash / hives    Social History   Socioeconomic History   Marital status: Married    Spouse name: Dominica Severin   Number of children: 1   Years of education: Not on file   Highest education level: Not on file  Occupational History   Not on file  Tobacco Use   Smoking status: Every Day    Packs/day: 1.00    Years: 45.00    Pack years: 45.00    Types: Cigarettes   Smokeless tobacco: Never  Vaping Use   Vaping Use: Never used  Substance and Sexual Activity   Alcohol use: No   Drug use: Yes    Frequency: 7.0 times per week    Types: Marijuana    Comment: daily marijuana use   Sexual activity: Not Currently    Partners: Male  Other Topics Concern   Not on file  Social History Narrative   Not on file   Social Determinants of Health   Financial Resource Strain: Not on file  Food Insecurity: Not on file  Transportation Needs: Not on file  Physical Activity: Not on file  Stress: Not on file  Social Connections: Not on file  Intimate Partner Violence: Not on file    Family History  Problem Relation Age of Onset   Dementia Mother    Congestive Heart Failure Father    Dementia Maternal Grandmother    Dementia Maternal Aunt    Dementia Maternal Aunt     ROS- All systems are reviewed and negative except as per the HPI above  Physical Exam: Vitals:   10/29/21 1058  Weight: 77.6 kg  Height: '5\' 4"'$  (1.626 m)   Wt Readings from Last 3 Encounters:  10/29/21 77.6 kg  09/17/21 77.7 kg  09/16/21 78 kg    Labs: Lab Results  Component Value Date   NA 141 09/16/2021   K 4.5 09/16/2021   CL 105 09/16/2021   CO2 27 09/16/2021   GLUCOSE 101 (H) 09/16/2021   BUN 24 (H) 09/16/2021   CREATININE 0.80 09/16/2021   CALCIUM 9.2 09/16/2021   MG 2.1 07/03/2017   No results found for: INR Lab Results  Component  Value Date   CHOL 206 (H) 07/15/2021   HDL 43 07/15/2021   LDLCALC 140 (H) 07/15/2021   TRIG 126 07/15/2021     GEN- The patient is well appearing, alert and oriented x 3 today.   Head- normocephalic, atraumatic Eyes-  Sclera clear, conjunctiva pink Ears- hearing intact Oropharynx- clear Neck- supple, no JVP Lymph- no cervical lymphadenopathy Lungs- Clear to ausculation bilaterally, normal work of breathing Heart- Regular rate and rhythm, no murmurs, rubs or gallops,  PMI not laterally displaced GI- soft, NT, ND, + BS Extremities- no clubbing, cyanosis, or edema MS- no significant deformity or atrophy Skin- no rash or lesion Psych- euthymic mood, full affect Neuro- strength and sensation are intact  EKG- Vent. rate 62 BPM PR interval 158 ms QRS duration 82 ms QT/QTcB 414/420 ms P-R-T axes 55 39 42 Normal sinus rhythm Low voltage QRS Borderline ECG When compared with ECG of 17-Sep-2021 11:20, PREVIOUS ECG IS PRESENT  Echo- 6 2022-1. Technically difficlut echo with poor image quality.   2. Left ventricular ejection fraction, by estimation, is 60 to 65%. The  left ventricle has normal function. The left ventricle has no regional  wall motion abnormalities. Left ventricular diastolic parameters were  normal.   3. Right ventricular systolic function is normal. The right ventricular  size is normal.   4. The mitral valve was not well visualized. No evidence of mitral valve  regurgitation.   5. The aortic valve was not well visualized. Aortic valve regurgitation  is not visualized. No aortic stenosis is present.   Assessment and Plan:  1. Paroxysmal afib Burden is low Had a successful cardioversion in  the ER last December 2022 ER visit, 09/16/21 with controlled v rates and d/c in afib to f/u here  No known trigger  She is now back in [place] education re afib and when it would be necessary to go to a ER for treatment vrs trying to treat at home Has  30 mg Cardizem  and given instructions of use to try to convert at home  I discussed antiarrythmic's, Multaq or flecainide but pt is on drugs that are qt prolonging,   Here are the thoughts of Fuller Canada, PharmD - " [name] on a lot of QTc prolonging meds. Flecainide will interact with celexa and risperidone, multaq is contraindicated with celexa and also interacts with risperidone  Celexa is one of the most qtc prolonging SSRIs, not sure if that could be changed to something else by PCP? then either option might be ok but would still need close EKG monitoring bc of the interaction with risperidone"  So for now will avoid antiarrythmic's try the prn cardizem to use if needed She will be most likely a front line ablation candidate if afib burden issues  She has the clinic as a resource  and encouraged for her to call us with any afib concerns Sleep study ordered  Tobacco use discouraged Exercise and weight loss encouraged  2. CHA2DS2VASc  score of 1-2 ( pt denies BP dx)  Per guidelines does not require anticoagulation at this time   3. Snoring Pt deferred sleep study at this time   I will see back in 6 months   Butch Penny C. Whitaker Holderman, [facility] [address] [place], [place] [address] [phone]

## 2021-11-06 ENCOUNTER — Encounter (HOSPITAL_BASED_OUTPATIENT_CLINIC_OR_DEPARTMENT_OTHER): Payer: Medicare HMO | Admitting: Cardiology

## 2021-11-27 ENCOUNTER — Other Ambulatory Visit: Payer: Self-pay | Admitting: Physician Assistant

## 2021-12-11 ENCOUNTER — Ambulatory Visit: Payer: Medicare HMO | Admitting: Physician Assistant

## 2021-12-18 ENCOUNTER — Encounter: Payer: Self-pay | Admitting: Physician Assistant

## 2021-12-18 ENCOUNTER — Ambulatory Visit: Payer: Medicare HMO | Admitting: Physician Assistant

## 2021-12-18 DIAGNOSIS — F411 Generalized anxiety disorder: Secondary | ICD-10-CM

## 2021-12-18 DIAGNOSIS — F319 Bipolar disorder, unspecified: Secondary | ICD-10-CM

## 2021-12-18 DIAGNOSIS — F17200 Nicotine dependence, unspecified, uncomplicated: Secondary | ICD-10-CM

## 2021-12-18 MED ORDER — GABAPENTIN 100 MG PO CAPS: 100.0000 mg | ORAL_CAPSULE | Freq: Every day | ORAL | 2 refills | Status: DC

## 2021-12-18 MED ORDER — LAMOTRIGINE 100 MG PO TABS: 100.0000 mg | ORAL_TABLET | Freq: Every day | ORAL | 2 refills | Status: DC

## 2021-12-18 MED ORDER — RISPERIDONE 1 MG PO TABS: 1.0000 mg | ORAL_TABLET | Freq: Every day | ORAL | 2 refills | Status: DC

## 2021-12-18 MED ORDER — CITALOPRAM HYDROBROMIDE 20 MG PO TABS: 20.0000 mg | ORAL_TABLET | Freq: Every day | ORAL | 2 refills | Status: DC

## 2021-12-18 NOTE — Progress Notes (Signed)
[facility] Med Check  Patient ID: Amanda [name],  MRN: [id number]  PCP: Orma Render, NP  Date of Evaluation: 12/18/2021 Time spent:30 minutes  Chief Complaint:  Chief Complaint   Follow-up    HISTORY/CURRENT STATUS: HPI For routine med check.   Amanda [name] is doing well.  Feels that her medications are still doing their job. Patient denies loss of interest in usual activities and is able to enjoy things.  Denies decreased energy.  Denies decreased motivation. ADLs and personal hygiene are normal.  Appetite has not changed.  Weight is stable.  No extreme sadness, tearfulness, or feelings of hopelessness.  Sleeps well most of the time.  Denies any changes in concentration, making decisions or remembering things.  No complaints of anxiety.  Denies suicidal or homicidal thoughts.  Patient denies increased energy with decreased need for sleep, no increased talkativeness, no racing thoughts, no impulsivity or risky behaviors, no increased spending, no increased libido, no grandiosity, no increased irritability or anger, no paranoia, and no hallucinations.  Still smoking.  "I love it.  I do not want to quit but I know it is killing me."  Denies dizziness, syncope, seizures, numbness, tingling, tremor, tics, unsteady gait, slurred speech, confusion. Denies muscle or joint pain, stiffness, or dystonia.  Individual Medical History/ Review of Systems: Changes? :Yes   A Fib is controlled.  Past medications for mental health diagnoses include: Depakote, Lamictal, Abilify, Xanax, Celexa, Risperdal, lithium  Allergies: Augmentin [amoxicillin-pot clavulanate], Doxycycline, Sumatriptan, and Doxycycline hyclate  Current Medications:  Current Outpatient Medications:    albuterol (VENTOLIN HFA) 108 (90 Base) MCG/ACT inhaler, Inhale 2 puffs into the lungs in the morning and at bedtime., Disp: , Rfl:    augmented betamethasone dipropionate (DIPROLENE-AF) 0.05 % cream, Apply 1 application topically 2  (two) times daily as needed (itching)., Disp: , Rfl:    baclofen (LIORESAL) 10 MG tablet, Take 10 mg by mouth 2 (two) times daily., Disp: , Rfl:    Buprenorphine HCl-Naloxone HCl 1.4-0.36 MG SUBL, Place 1-2 tablets under the tongue as directed. Take 2 tablets in the morning and Take 1 tablet in afternoon, Disp: , Rfl:    diltiazem (CARDIZEM CD) 120 MG 24 hr capsule, TAKE 1 CAPSULE (120 MG TOTAL) BY MOUTH DAILY., Disp: 90 capsule, Rfl: 3   diltiazem (CARDIZEM) 30 MG tablet, Take 1 tablet every 4 hours AS NEEDED for heart rate >90, Disp: 30 tablet, Rfl: 1   ezetimibe (ZETIA) 10 MG tablet, Take 10 mg by mouth daily., Disp: , Rfl:    Ibuprofen (MOTRIN PO), Take 800 mg by mouth 3 (three) times daily., Disp: , Rfl:    ondansetron (ZOFRAN-ODT) 4 MG disintegrating tablet, ondansetron 4 mg disintegrating tablet, Disp: , Rfl:    citalopram (CELEXA) 20 MG tablet, Take 1 tablet (20 mg total) by mouth at bedtime., Disp: 90 tablet, Rfl: 2   gabapentin (NEURONTIN) 100 MG capsule, Take 1 capsule (100 mg total) by mouth at bedtime., Disp: 90 capsule, Rfl: 2   lamoTRIgine (LAMICTAL) 100 MG tablet, Take 1 tablet (100 mg total) by mouth daily., Disp: 90 tablet, Rfl: 2   risperiDONE (RISPERDAL) 1 MG tablet, Take 1 tablet (1 mg total) by mouth at bedtime., Disp: 90 tablet, Rfl: 2   triamcinolone cream (KENALOG) 0.1 %, triamcinolone acetonide 0.1 % topical cream (Patient not taking: Reported on 10/29/2021), Disp: , Rfl:  Medication Side Effects: none  Family Medical/ Social History: Changes? No  MENTAL HEALTH EXAM:  There were no vitals taken  for this visit.There is no height or weight on file to calculate BMI.  General Appearance: Casual, Neat and Well Groomed  Eye Contact:  Good  Speech:  Normal Rate  Volume:  Normal  Mood:  Euthymic  Affect:  Appropriate  Thought Process:  Goal Directed and Descriptions of Associations: Circumstantial  Orientation:  Full (Time, Place, and Person)  Thought Content: Logical    Suicidal Thoughts:  No  Homicidal Thoughts:  No  Memory:  WNL  Judgement:  Good  Insight:  Good  Psychomotor Activity:  Normal  Concentration:  Concentration: Good and Attention Span: Good  Recall:  Good  Fund of Knowledge: Good  Language: Good  Assets:  Desire for Improvement  ADL's:  Intact  Cognition: WNL  Prognosis:  Good   Labs 07/15/2021 CBC with differential normal CMP completely normal Total cholesterol 206, triglycerides 126, HDL 43, LDL 140  DIAGNOSES:    ICD-10-CM   1. Bipolar I disorder ([place])  F31.9     2. Generalized anxiety disorder  F41.1     3. Smoker  F17.200       Receiving Psychotherapy: No   RECOMMENDATIONS:  PDMP reviewed. [employer] 12/15/2021.  I provided 30 minutes of face to face time during this encounter, including time spent before and after the visit in records review, medical decision making, counseling pertinent to today's visit, and charting.   We briefly discussed the smoking cessation.  She refuses to take Chantix as she has heard horrible things about it, and does not really want to try anything else. She is doing well with her mental health overall, no changes will be made.  Continue Celexa 20 mg qd. Continue Gabapentin 100 mg qhs. Continue Lamictal 100 mg, 1 qhs. Continue Risperdal 1 mg, 1 qhs.  Return in 9 months.  Donnal Moat, PA-C

## 2022-03-16 ENCOUNTER — Emergency Department (HOSPITAL_COMMUNITY)
Admission: EM | Admit: 2022-03-16 | Discharge: 2022-03-16 | Discharge status: Against Medical Advice | Payer: Medicare HMO | Attending: Emergency Medicine | Admitting: Emergency Medicine

## 2022-03-16 ENCOUNTER — Emergency Department (HOSPITAL_COMMUNITY): Payer: Medicare HMO

## 2022-03-16 ENCOUNTER — Encounter: Payer: Self-pay | Admitting: Cardiology

## 2022-03-16 ENCOUNTER — Encounter (HOSPITAL_COMMUNITY): Payer: Self-pay | Admitting: Emergency Medicine

## 2022-03-16 DIAGNOSIS — R42 Dizziness and giddiness: Secondary | ICD-10-CM | POA: Diagnosis not present

## 2022-03-16 DIAGNOSIS — I4891 Unspecified atrial fibrillation: Secondary | ICD-10-CM | POA: Diagnosis present

## 2022-03-16 DIAGNOSIS — Z5321 Procedure and treatment not carried out due to patient leaving prior to being seen by health care provider: Secondary | ICD-10-CM | POA: Insufficient documentation

## 2022-03-16 LAB — TROPONIN I (HIGH SENSITIVITY): Troponin I (High Sensitivity): 5 ng/L (ref <18)

## 2022-03-16 LAB — CBC
HCT: 46.8 % — ABNORMAL HIGH (ref 36.0–46.0)
Hemoglobin: 15.2 g/dL — ABNORMAL HIGH (ref 12.0–15.0)
MCH: 28.6 pg (ref 26.0–34.0)
MCHC: 32.5 g/dL (ref 30.0–36.0)
MCV: 88 fL (ref 80.0–100.0)
Platelets: 250 10*3/uL (ref 150–400)
RBC: 5.32 MIL/uL — ABNORMAL HIGH (ref 3.87–5.11)
RDW: 14 % (ref 11.5–15.5)
WBC: 10.9 10*3/uL — ABNORMAL HIGH (ref 4.0–10.5)
nRBC: 0 % (ref 0.0–0.2)

## 2022-03-16 LAB — BASIC METABOLIC PANEL
Anion gap: 10 (ref 5–15)
BUN: 20 mg/dL (ref 8–23)
CO2: 26 mmol/L (ref 22–32)
Calcium: 9 mg/dL (ref 8.9–10.3)
Chloride: 106 mmol/L (ref 98–111)
Creatinine, Ser: 0.72 mg/dL (ref 0.44–1.00)
GFR, Estimated: >60 mL/min (ref >60)
Glucose, Bld: 102 mg/dL — ABNORMAL HIGH (ref 70–99)
Potassium: 4.3 mmol/L (ref 3.5–5.1)
Sodium: 142 mmol/L (ref 135–145)

## 2022-03-16 NOTE — ED Notes (Signed)
PATIENT LEFT AMA  TO GO TO [facility]

## 2022-03-16 NOTE — ED Triage Notes (Signed)
Patient here feeling like she is in afib since Saturday morning, history of same. Patient takes cardizem daily and has a prescribed immediate release dose to take if she feels like she is in afib which she took yesterday. Patient complains of dizziness.

## 2022-03-16 NOTE — ED Provider Triage Note (Signed)
Emergency Medicine Provider Triage Evaluation Note  Amanda [name] , a 66 y.o. female  was evaluated in triage.  Pt complains of afib since Saturday. The patient is compliant to her medications. She reports that she is NOT on a blood thinner. She reports SOB, but has COPD/emphysema. She reports that this is how she typically feels when she is in afib.  Review of Systems  Positive:  Negative:   Physical Exam  BP 120/77   Pulse 78   Resp 15   SpO2 94%  Gen:   Awake, no distress   Resp:  Normal effort  MSK:   Moves extremities without difficulty  Other:  Irregularly irregular. HR <100. NAD appearing.   Medical Decision Making  Medically screening exam initiated at 10:32 AM.  Appropriate orders placed.  [name] was informed that the remainder of the evaluation will be completed by another provider, this initial triage assessment does not replace that evaluation, and the importance of remaining in the ED until their evaluation is complete.  Labs ordered   Sherrell Puller, [place] 03/16/22 1034

## 2022-03-18 ENCOUNTER — Ambulatory Visit: Payer: Medicare HMO | Admitting: Dermatology

## 2022-03-31 ENCOUNTER — Ambulatory Visit (HOSPITAL_BASED_OUTPATIENT_CLINIC_OR_DEPARTMENT_OTHER): Payer: Medicare HMO | Admitting: Nurse Practitioner

## 2022-04-03 ENCOUNTER — Encounter (HOSPITAL_BASED_OUTPATIENT_CLINIC_OR_DEPARTMENT_OTHER): Payer: Self-pay | Admitting: Nurse Practitioner

## 2022-04-03 ENCOUNTER — Ambulatory Visit (INDEPENDENT_AMBULATORY_CARE_PROVIDER_SITE_OTHER): Payer: Medicare HMO | Admitting: Nurse Practitioner

## 2022-04-03 VITALS — BP 131/56 | HR 63 | Ht 61.0 in | Wt 163.0 lb

## 2022-04-03 DIAGNOSIS — T63301A Toxic effect of unspecified spider venom, accidental (unintentional), initial encounter: Secondary | ICD-10-CM | POA: Diagnosis not present

## 2022-04-03 NOTE — Progress Notes (Addendum)
  Orma Render, DNP, AGNP-c [facility] [address]  [place], [place] [address] [phone]  Subjective:   JAIMIE [name] is a 66 y.o. female presents to day for evaluation of: Acute Visit (Pt presents today for spider bite on right leg)   Bug bite on right lower calf a week and a half ago. Felt the bite, but never saw the bug. Turned red immediately and grew to a 1.8cm x 1.8 cm circle of red with raised edges. The edges were much darker than the center. She denies fevers, chills, upset stomach, joint pain.  She tells me the bite was extremely painful.   PMH, Medications, and Allergies reviewed and updated in chart as appropriate.   ROS negative except for what is listed in HPI. Objective:  BP (!) 131/56   Pulse 63   Ht '5\' 1"'$  (1.549 m)   Wt 163 lb (73.9 kg)   SpO2 99%   BMI 30.80 kg/m  Physical Exam Vitals and nursing note reviewed.  Constitutional:      Appearance: Normal appearance.  Cardiovascular:     Rate and Rhythm: Normal rate and regular rhythm.     Pulses: Normal pulses.     Heart sounds: Normal heart sounds.  Pulmonary:     Effort: Pulmonary effort is normal.     Breath sounds: Normal breath sounds.  Musculoskeletal:        General: Normal range of motion.     Right lower leg: No edema.     Left lower leg: No edema.  Skin:    General: Skin is warm and dry.     Capillary Refill: Capillary refill takes less than 2 seconds.     Comments: Erythematous patch noted on right lower calf- appx 2cm x 2cm. No drainage. Warmth present.   Neurological:     Mental Status: She is alert.           Assessment & Plan:   Problem List Items Addressed This Visit     Spider bite - Primary    Symptoms and presentation consistent with spider bite.  Area appears to be healing well with no signs of infection.  There is mild warmth noted in the area however it is still erythematous.  Given that there is no drainage or  fluctuance I do not feel that antibiotic therapy is necessary at this time.  Encouraged the patient to continue to monitor the area and keep it dry.  If any new or worsening symptoms develop the we will further evaluate for treatment.         Orma Render, DNP, AGNP-c 04/07/2022  10:35 PM    History, Medications, Surgery, SDOH, and Family History reviewed and updated as appropriate.

## 2022-04-03 NOTE — Patient Instructions (Signed)
It does appear that the wound is a spider bite. This does not look infected, which is good, but you have had an allergic reaction to the spider which has caused the reaction. Keep using the betamethasone cream twice a day. Watch for any kinds of drainage or if the area is getting bigger. If it changes let me know and we can start antibiotics.

## 2022-04-07 DIAGNOSIS — T63301A Toxic effect of unspecified spider venom, accidental (unintentional), initial encounter: Secondary | ICD-10-CM | POA: Insufficient documentation

## 2022-04-07 NOTE — Assessment & Plan Note (Signed)
Symptoms and presentation consistent with spider bite.  Area appears to be healing well with no signs of infection.  There is mild warmth noted in the area however it is still erythematous.  Given that there is no drainage or fluctuance I do not feel that antibiotic therapy is necessary at this time.  Encouraged the patient to continue to monitor the area and keep it dry.  If any new or worsening symptoms develop the we will further evaluate for treatment.

## 2022-04-24 ENCOUNTER — Ambulatory Visit (INDEPENDENT_AMBULATORY_CARE_PROVIDER_SITE_OTHER): Payer: Medicare HMO | Admitting: Nurse Practitioner

## 2022-04-24 ENCOUNTER — Encounter (HOSPITAL_BASED_OUTPATIENT_CLINIC_OR_DEPARTMENT_OTHER): Payer: Self-pay | Admitting: Nurse Practitioner

## 2022-04-24 VITALS — BP 129/61 | HR 68 | Temp 98.2°F | Ht 61.0 in | Wt 169.1 lb

## 2022-04-24 DIAGNOSIS — L308 Other specified dermatitis: Secondary | ICD-10-CM | POA: Diagnosis not present

## 2022-04-24 DIAGNOSIS — Z23 Encounter for immunization: Secondary | ICD-10-CM

## 2022-04-24 MED ORDER — KETOCONAZOLE 2 % EX CREA: 1.0000 | TOPICAL_CREAM | Freq: Two times a day (BID) | CUTANEOUS | 3 refills | Status: DC

## 2022-04-24 NOTE — Progress Notes (Signed)
  Orma Render, DNP, AGNP-c [facility] [address]  [place], [place] [address] [phone]  Subjective:   Amanda [name] is a 66 y.o. female presents to day for evaluation of: Follow-up (Spider Bite)  She was last seen for evaluation of spider bite about 3 weeks ago. She has completed treatment. She tells me the bite area does not hurt or itch at all and is not red or draining. The area is currently 1.9cm x  1.9cm and is not improving or worsening.   PMH, Medications, and Allergies reviewed and updated in chart as appropriate.   ROS negative except for what is listed in HPI. Objective:  BP 129/61   Pulse 68   Temp 98.2 F (36.8 C) (Oral)   Ht '5\' 1"'$  (1.549 m)   Wt 169 lb 1.6 oz (76.7 kg)   SpO2 96%   BMI 31.95 kg/m  Physical Exam Vitals and nursing note reviewed.  Constitutional:      Appearance: Normal appearance.  HENT:     Head: Normocephalic.  Musculoskeletal:        General: Normal range of motion.     Right lower leg: No edema.     Left lower leg: No edema.  Skin:    General: Skin is warm and dry.     Capillary Refill: Capillary refill takes less than 2 seconds.     Findings: Lesion present.       Neurological:     General: No focal deficit present.     Mental Status: She is alert and oriented to person, place, and time.     Sensory: No sensory deficit.     Motor: No weakness.           Assessment & Plan:   Problem List Items Addressed This Visit     Annular dermatitis - Primary    Initially we thought this area of annular erythema to be due to an arachnid bite, however, there has been no change in appearance since initial assessment. It appears consistent with possible tinea infection. At this time I recommend we trial ketoconazole topically to see if this helps.       Other Visit Diagnoses     Need for immunization against influenza       Relevant Orders   Flu Vaccine QUAD 45moIM  (Fluarix, Fluzone & Alfiuria Quad PF) (Completed)         SOrma Render DNP, AGNP-c 06/07/2022  10:47 PM    History, Medications, Surgery, SDOH, and Family History reviewed and updated as appropriate.

## 2022-04-24 NOTE — Patient Instructions (Addendum)
I have sent in a different cream for you to use along with the steroid cream. You can use this twice a day for 7 days then take a break for a week then start again and repeat until it goes away.   If you decide that the distance is not any further, feel free to follow me to the new office.  My new office is [facility] located at [address], [place]. Their number is [phone]

## 2022-06-07 DIAGNOSIS — L308 Other specified dermatitis: Secondary | ICD-10-CM | POA: Insufficient documentation

## 2022-06-07 NOTE — Assessment & Plan Note (Signed)
Initially we thought this area of annular erythema to be due to an arachnid bite, however, there has been no change in appearance since initial assessment. It appears consistent with possible tinea infection. At this time I recommend we trial ketoconazole topically to see if this helps.

## 2022-06-10 ENCOUNTER — Other Ambulatory Visit: Payer: Self-pay | Admitting: Cardiology

## 2022-06-24 ENCOUNTER — Other Ambulatory Visit: Payer: Self-pay | Admitting: Specialist

## 2022-06-24 DIAGNOSIS — R519 Headache, unspecified: Secondary | ICD-10-CM

## 2022-06-25 ENCOUNTER — Ambulatory Visit
Admission: RE | Admit: 2022-06-25 | Discharge: 2022-06-25 | Disposition: A | Discharge status: Home / Self Care | Payer: Medicare HMO | Source: Ambulatory Visit | Attending: Specialist | Admitting: Specialist

## 2022-06-25 DIAGNOSIS — R519 Headache, unspecified: Secondary | ICD-10-CM

## 2022-07-16 ENCOUNTER — Encounter (HOSPITAL_COMMUNITY): Payer: Self-pay | Admitting: *Deleted

## 2022-07-26 ENCOUNTER — Other Ambulatory Visit: Payer: Self-pay | Admitting: Physician Assistant

## 2022-08-05 ENCOUNTER — Ambulatory Visit: Payer: Medicare HMO | Admitting: Nurse Practitioner

## 2022-08-20 ENCOUNTER — Telehealth: Payer: Self-pay | Admitting: Nurse Practitioner

## 2022-08-20 NOTE — Telephone Encounter (Signed)
Contacted Amanda [name] to schedule their annual wellness visit. Appointment made for 09/01/22.  Amanda [name] AWV direct phone # [phone]

## 2022-08-22 ENCOUNTER — Other Ambulatory Visit: Payer: Self-pay | Admitting: Nurse Practitioner

## 2022-09-01 ENCOUNTER — Ambulatory Visit (INDEPENDENT_AMBULATORY_CARE_PROVIDER_SITE_OTHER): Payer: Medicare HMO

## 2022-09-01 VITALS — Ht 63.0 in | Wt 170.0 lb

## 2022-09-01 DIAGNOSIS — Z0000 Encounter for general adult medical examination without abnormal findings: Secondary | ICD-10-CM

## 2022-09-01 NOTE — Patient Instructions (Addendum)
Amanda [name] , Thank you for taking time to come for your Medicare Wellness Visit. I appreciate your ongoing commitment to your health goals. Please review the following plan we discussed and let me know if I can assist you in the future.   These are the goals we discussed:  Goals      Patient Stated     09/01/2022, quit smoking and start exercising        This is a list of the screening recommended for you and due dates:  Health Maintenance  Topic Date Due   Zoster (Shingles) Vaccine (1 of 2) Never done   Pap Smear  Never done   Colon Cancer Screening  Never done   Mammogram  Never done   COVID-19 Vaccine (2 - Pfizer risk series) 06/06/2020   Screening for Lung Cancer  10/21/2021   Medicare Annual Wellness Visit  09/01/2023   DTaP/Tdap/Td vaccine (3 - Td or Tdap) 04/16/2027   Flu Shot  Completed   HIV Screening  Completed   HPV Vaccine  Aged Out   Hepatitis C Screening: USPSTF Recommendation to screen - Ages 18-79 yo.  Discontinued    Advanced directives: Please bring a copy of your POA (Power of Attorney) and/or Living Will to your next appointment.   Conditions/risks identified: smoking  Next appointment: Follow up in one year for your annual wellness visit.   Preventive Care 40-64 Years, Female Preventive care refers to lifestyle choices and visits with your health care provider that can promote health and wellness. What does preventive care include? A yearly physical exam. This is also called an annual well check. Dental exams once or twice a year. Routine eye exams. Ask your health care provider how often you should have your eyes checked. Personal lifestyle choices, including: Daily care of your teeth and gums. Regular physical activity. Eating a healthy diet. Avoiding tobacco and drug use. Limiting alcohol use. Practicing safe sex. Taking low-dose aspirin daily starting at age 4. Taking vitamin and mineral supplements as recommended by your health care  provider. What happens during an annual well check? The services and screenings done by your health care provider during your annual well check will depend on your age, overall health, lifestyle risk factors, and family history of disease. Counseling  Your health care provider may ask you questions about your: Alcohol use. Tobacco use. Drug use. Emotional well-being. Home and relationship well-being. Sexual activity. Eating habits. Work and work Statistician. Method of birth control. Menstrual cycle. Pregnancy history. Screening  You may have the following tests or measurements: Height, weight, and BMI. Blood pressure. Lipid and cholesterol levels. These may be checked every 5 years, or more frequently if you are over 5 years old. Skin check. Lung cancer screening. You may have this screening every year starting at age 60 if you have a 30-pack-year history of smoking and currently smoke or have quit within the past 15 years. Fecal occult blood test (FOBT) of the stool. You may have this test every year starting at age 1. Flexible sigmoidoscopy or colonoscopy. You may have a sigmoidoscopy every 5 years or a colonoscopy every 10 years starting at age 44. Hepatitis C blood test. Hepatitis B blood test. Sexually transmitted disease (STD) testing. Diabetes screening. This is done by checking your blood sugar (glucose) after you have not eaten for a while (fasting). You may have this done every 1-3 years. Mammogram. This may be done every 1-2 years. Talk to your health care provider about  when you should start having regular mammograms. This may depend on whether you have a family history of breast cancer. BRCA-related cancer screening. This may be done if you have a family history of breast, ovarian, tubal, or peritoneal cancers. Pelvic exam and Pap test. This may be done every 3 years starting at age 11. Starting at age 48, this may be done every 5 years if you have a Pap test in  combination with an HPV test. Bone density scan. This is done to screen for osteoporosis. You may have this scan if you are at high risk for osteoporosis. Discuss your test results, treatment options, and if necessary, the need for more tests with your health care provider. Vaccines  Your health care provider may recommend certain vaccines, such as: Influenza vaccine. This is recommended every year. Tetanus, diphtheria, and acellular pertussis (Tdap, Td) vaccine. You may need a Td booster every 10 years. Zoster vaccine. You may need this after age 74. Pneumococcal 13-valent conjugate (PCV13) vaccine. You may need this if you have certain conditions and were not previously vaccinated. Pneumococcal polysaccharide (PPSV23) vaccine. You may need one or two doses if you smoke cigarettes or if you have certain conditions. Talk to your health care provider about which screenings and vaccines you need and how often you need them. This information is not intended to replace advice given to you by your health care provider. Make sure you discuss any questions you have with your health care provider. Document Released: 06/21/2015 Document Revised: 02/12/2016 Document Reviewed: 03/26/2015 Elsevier Interactive Patient Education  2017 [place] Prevention in the Home Falls can cause injuries. They can happen to people of all ages. There are many things you can do to make your home safe and to help prevent falls. What can I do on the outside of my home? Regularly fix the edges of walkways and driveways and fix any cracks. Remove anything that might make you trip as you walk through a door, such as a raised step or threshold. Trim any bushes or trees on the path to your home. Use bright outdoor lighting. Clear any walking paths of anything that might make someone trip, such as rocks or tools. Regularly check to see if handrails are loose or broken. Make sure that both sides of any steps have  handrails. Any raised decks and porches should have guardrails on the edges. Have any leaves, snow, or ice cleared regularly. Use sand or salt on walking paths during winter. Clean up any spills in your garage right away. This includes oil or grease spills. What can I do in the bathroom? Use night lights. Install grab bars by the toilet and in the tub and shower. Do not use towel bars as grab bars. Use non-skid mats or decals in the tub or shower. If you need to sit down in the shower, use a plastic, non-slip stool. Keep the floor dry. Clean up any water that spills on the floor as soon as it happens. Remove soap buildup in the tub or shower regularly. Attach bath mats securely with double-sided non-slip rug tape. Do not have throw rugs and other things on the floor that can make you trip. What can I do in the bedroom? Use night lights. Make sure that you have a light by your bed that is easy to reach. Do not use any sheets or blankets that are too big for your bed. They should not hang down onto the floor. Have  a firm chair that has side arms. You can use this for support while you get dressed. Do not have throw rugs and other things on the floor that can make you trip. What can I do in the kitchen? Clean up any spills right away. Avoid walking on wet floors. Keep items that you use a lot in easy-to-reach places. If you need to reach something above you, use a strong step stool that has a grab bar. Keep electrical cords out of the way. Do not use floor polish or wax that makes floors slippery. If you must use wax, use non-skid floor wax. Do not have throw rugs and other things on the floor that can make you trip. What can I do with my stairs? Do not leave any items on the stairs. Make sure that there are handrails on both sides of the stairs and use them. Fix handrails that are broken or loose. Make sure that handrails are as long as the stairways. Check any carpeting to make sure  that it is firmly attached to the stairs. Fix any carpet that is loose or worn. Avoid having throw rugs at the top or bottom of the stairs. If you do have throw rugs, attach them to the floor with carpet tape. Make sure that you have a light switch at the top of the stairs and the bottom of the stairs. If you do not have them, ask someone to add them for you. What else can I do to help prevent falls? Wear shoes that: Do not have high heels. Have rubber bottoms. Are comfortable and fit you well. Are closed at the toe. Do not wear sandals. If you use a stepladder: Make sure that it is fully opened. Do not climb a closed stepladder. Make sure that both sides of the stepladder are locked into place. Ask someone to hold it for you, if possible. Clearly mark and make sure that you can see: Any grab bars or handrails. First and last steps. Where the edge of each step is. Use tools that help you move around (mobility aids) if they are needed. These include: Canes. Walkers. Scooters. Crutches. Turn on the lights when you go into a dark area. Replace any light bulbs as soon as they burn out. Set up your furniture so you have a clear path. Avoid moving your furniture around. If any of your floors are uneven, fix them. If there are any pets around you, be aware of where they are. Review your medicines with your doctor. Some medicines can make you feel dizzy. This can increase your chance of falling. Ask your doctor what other things that you can do to help prevent falls. This information is not intended to replace advice given to you by your health care provider. Make sure you discuss any questions you have with your health care provider. Document Released: 03/21/2009 Document Revised: 10/31/2015 Document Reviewed: 06/29/2014 Elsevier Interactive Patient Education  2017 [employer].

## 2022-09-01 NOTE — Progress Notes (Signed)
I connected with  Amanda [name] on 09/01/22 by a audio enabled telemedicine application and verified that I am speaking with the correct person using two identifiers.  Patient Location: Home  Provider Location: Office/Clinic  I discussed the limitations of evaluation and management by telemedicine. The patient expressed understanding and agreed to proceed.  Subjective:   Amanda [name] is a 67 y.o. female who presents for an Initial Medicare Annual Wellness Visit.  Review of Systems     Cardiac Risk Factors include: advanced age (>6men, >4 women);dyslipidemia;hypertension;obesity (BMI >30kg/m2);smoking/ tobacco exposure     Objective:    Today's Vitals   09/01/22 1127  Weight: 170 lb (77.1 kg)  Height: 5\' 3"  (1.6 m)   Body mass index is 30.11 kg/m.     09/01/2022   11:33 AM 09/16/2021   12:30 PM 07/04/2017   12:00 AM 07/03/2014    3:30 PM  Advanced Directives  Does Patient Have a Medical Advance Directive? Yes No No No  Type of Paramedic of [place];Living will     Copy of [place] in Chart? No - copy requested     Would patient like information on creating a medical advance directive?  No - Patient declined No - Patient declined     Current Medications (verified) Outpatient Encounter Medications as of 09/01/2022  Medication Sig   albuterol (VENTOLIN HFA) 108 (90 Base) MCG/ACT inhaler Inhale 2 puffs into the lungs in the morning and at bedtime.   augmented betamethasone dipropionate (DIPROLENE-AF) 0.05 % cream Apply 1 application topically 2 (two) times daily as needed (itching).   baclofen (LIORESAL) 10 MG tablet Take 10 mg by mouth 2 (two) times daily.   Buprenorphine HCl-Naloxone HCl 1.4-0.36 MG SUBL Place 1-2 tablets under the tongue as directed. Take 2 tablets in the morning and Take 1 tablet in afternoon   citalopram (CELEXA) 20 MG tablet TAKE 1 TABLET AT BEDTIME   diltiazem (CARDIZEM CD) 120 MG 24 hr capsule  TAKE 1 CAPSULE EVERY DAY   diltiazem (CARDIZEM) 30 MG tablet Take 1 tablet every 4 hours AS NEEDED for heart rate >90   ezetimibe (ZETIA) 10 MG tablet Take 10 mg by mouth daily.   gabapentin (NEURONTIN) 100 MG capsule Take 1 capsule (100 mg total) by mouth at bedtime.   Ibuprofen (MOTRIN PO) Take 800 mg by mouth 3 (three) times daily.   ketoconazole (NIZORAL) 2 % cream APPLY TOPICALLY 2 (TWO) TIMES DAILY TO RING ON LEG TWICE A DAY.   lamoTRIgine (LAMICTAL) 100 MG tablet TAKE 1 TABLET EVERY DAY   ondansetron (ZOFRAN-ODT) 4 MG disintegrating tablet ondansetron 4 mg disintegrating tablet   risperiDONE (RISPERDAL) 1 MG tablet Take 1 tablet (1 mg total) by mouth at bedtime.   triamcinolone cream (KENALOG) 0.1 %    zonisamide (ZONEGRAN) 25 MG capsule Take 100 mg by mouth at bedtime.   No facility-administered encounter medications on file as of 09/01/2022.    Allergies (verified) Augmentin [amoxicillin-pot clavulanate], Doxycycline, Sumatriptan, and Doxycycline hyclate   History: Past Medical History:  Diagnosis Date   Atrial fibrillation [facility])    DEGENERATIVE JOINT DISEASE 11/12/2006   DISORDER, BIPOLAR NOS 02/28/2007   GERD 11/12/2006   Major depressive disorder, single episode, unspecified 07/15/2021   Past Surgical History:  Procedure Laterality Date   ABDOMINAL HYSTERECTOMY     BREAST SURGERY     reduction   KNEE ARTHROSCOPY     TONSILLECTOMY     Family History  Problem Relation Age of Onset   Dementia Mother    Congestive Heart Failure Father    Dementia Maternal Grandmother    Dementia Maternal Aunt    Dementia Maternal Aunt    Social History   Socioeconomic History   Marital status: Married    Spouse name: Dominica Severin   Number of children: 1   Years of education: Not on file   Highest education level: Not on file  Occupational History   Not on file  Tobacco Use   Smoking status: Every Day    Packs/day: 0.50    Years: 45.00    Additional pack years: 0.00    Total pack  years: 22.50    Types: Cigarettes   Smokeless tobacco: Never  Vaping Use   Vaping Use: Never used  Substance and Sexual Activity   Alcohol use: No   Drug use: Yes    Frequency: 7.0 times per week    Types: Marijuana    Comment: daily marijuana use   Sexual activity: Not Currently    Partners: Male  Other Topics Concern   Not on file  Social History Narrative   Not on file   Social Determinants of Health   Financial Resource Strain: Low Risk  (09/01/2022)   Overall Financial Resource Strain (CARDIA)    Difficulty of Paying Living Expenses: Not hard at all  Food Insecurity: No Food Insecurity (09/01/2022)   Hunger Vital Sign    Worried About Running Out of Food in the Last Year: Never true    Ran Out of Food in the Last Year: Never true  Transportation Needs: No Transportation Needs (09/01/2022)   PRAPARE - Hydrologist (Medical): No    Lack of Transportation (Non-Medical): No  Physical Activity: Inactive (09/01/2022)   Exercise Vital Sign    Days of Exercise per Week: 0 days    Minutes of Exercise per Session: 0 min  Stress: No Stress Concern Present (09/01/2022)   [place]    Feeling of Stress : Not at all  Social Connections: Not on file    Tobacco Counseling Ready to quit: Yes Counseling given: Not Answered   Clinical Intake:  Pre-visit preparation completed: Yes  Pain : No/denies pain     Nutritional Status: BMI > 30  Obese Nutritional Risks: None Diabetes: No  How often do you need to have someone help you when you read instructions, pamphlets, or other written materials from your doctor or pharmacy?: 1 - Never  Diabetic? no  Interpreter Needed?: No  Information entered by :: NAllen LPN   Activities of Daily Living    09/01/2022   11:33 AM  In your present state of health, do you have any difficulty performing the following activities:  Hearing? 1   Vision? 0  Difficulty concentrating or making decisions? 1  Walking or climbing stairs? 1  Comment bad knee  Dressing or bathing? 0  Doing errands, shopping? 0  Preparing Food and eating ? N  Using the Toilet? N  In the past six months, have you accidently leaked urine? Y  Do you have problems with loss of bowel control? N  Managing your Medications? N  Managing your Finances? N  Housekeeping or managing your Housekeeping? N    Patient Care Team: Early, Coralee Pesa, NP as PCP - General (Nurse Practitioner) Lelon Perla, MD as PCP - Cardiology (Cardiology)  Indicate any recent Medical Services you  may have received from other than [facility] providers in the past year (date may be approximate).     Assessment:   This is a routine wellness examination for [place].  Hearing/Vision screen Vision Screening - Comments:: Regular eye exams, [facility]  Dietary issues and exercise activities discussed: Current Exercise Habits: The patient does not participate in regular exercise at present   Goals Addressed             This Visit's Progress    Patient Stated       09/01/2022, quit smoking and start exercising       Depression Screen    09/01/2022   11:33 AM 04/24/2022   10:49 AM 07/17/2021    7:35 AM  PHQ 2/9 Scores  PHQ - 2 Score 0 0 0  PHQ- 9 Score 0 0 0    Fall Risk    09/01/2022   11:33 AM 04/24/2022   10:50 AM 07/17/2021    7:35 AM  [place] in the past year? 0 0 0  Number falls in past yr: 0  0  Injury with Fall? 0  0  Risk for fall due to : Medication side effect  No Fall Risks  Follow up Falls prevention discussed;Education provided;Falls evaluation completed  Falls evaluation completed    FALL RISK PREVENTION PERTAINING TO THE HOME:  Any stairs in or around the home? No  If so, are there any without handrails? N/a Home free of loose throw rugs in walkways, pet beds, electrical cords, etc? Yes  Adequate lighting in your home to reduce risk of  falls? Yes   ASSISTIVE DEVICES UTILIZED TO PREVENT FALLS:  Life alert? No  Use of a cane, walker or w/c? No  Grab bars in the bathroom? No  Shower chair or bench in shower? No  Elevated toilet seat or a handicapped toilet? No   TIMED UP AND GO:  Was the test performed? No .      Cognitive Function:        09/01/2022   11:35 AM  6CIT Screen  What Year? 0 points  What month? 0 points  What time? 0 points  Count back from 20 0 points  Months in reverse 0 points  Repeat phrase 0 points  Total Score 0 points    Immunizations Immunization History  Administered Date(s) Administered   Influenza, Quadrivalent, Recombinant, Inj, Pf 04/15/2017, 02/17/2018, 03/30/2019   Influenza,inj,Quad PF,6+ Mos 04/24/2022   Influenza,inj,quad, With Preservative 05/08/2017   Influenza-Unspecified 04/19/2012, 02/28/2013, 03/11/2015, 05/10/2016, 04/02/2019, 03/08/2021   PFIZER(Purple Top)SARS-COV-2 Vaccination 05/16/2020   Td 06/08/2002   Tdap 04/15/2017    TDAP status: Up to date  Flu Vaccine status: Up to date  Pneumococcal vaccine status: Up to date  Covid-19 vaccine status: Completed vaccines  Qualifies for Shingles Vaccine? Yes   Zostavax completed No   Shingrix Completed?: No.    Education has been provided regarding the importance of this vaccine. Patient has been advised to call insurance company to determine out of pocket expense if they have not yet received this vaccine. Advised may also receive vaccine at local pharmacy or Health Dept. Verbalized acceptance and understanding.  Screening Tests Health Maintenance  Topic Date Due   Medicare Annual Wellness (AWV)  Never done   Zoster Vaccines- Shingrix (1 of 2) Never done   PAP SMEAR-Modifier  Never done   COLONOSCOPY (Pts 45-49yrs Insurance coverage will need to be confirmed)  Never done  MAMMOGRAM  Never done   COVID-19 Vaccine (2 - Pfizer risk series) 06/06/2020   Lung Cancer Screening  10/21/2021   DTaP/Tdap/Td (3 -  Td or Tdap) 04/16/2027   INFLUENZA VACCINE  Completed   HIV Screening  Completed   HPV VACCINES  Aged Out   Hepatitis C Screening  Discontinued    Health Maintenance  Health Maintenance Due  Topic Date Due   Medicare Annual Wellness (AWV)  Never done   Zoster Vaccines- Shingrix (1 of 2) Never done   PAP SMEAR-Modifier  Never done   COLONOSCOPY (Pts 45-28yrs Insurance coverage will need to be confirmed)  Never done   MAMMOGRAM  Never done   COVID-19 Vaccine (2 - Pfizer risk series) 06/06/2020   Lung Cancer Screening  10/21/2021    Colorectal cancer screening: has a consult 09/30/2022  Mammogram status: Completed 2023. Repeat every year  Bone Density status: due  Lung Cancer Screening: (Low Dose CT Chest recommended if Age 60-80 years, 30 pack-year currently smoking OR have quit w/in 15years.) does qualify.   Lung Cancer Screening Referral: CT scan 08/2022  Additional Screening:  Hepatitis C Screening: does not qualify;  Vision Screening: Recommended annual ophthalmology exams for early detection of glaucoma and other disorders of the eye. Is the patient up to date with their annual eye exam?  Yes  Who is the provider or what is the name of the office in which the patient attends annual eye exams? [facility] If pt is not established with a provider, would they like to be referred to a provider to establish care? No .   Dental Screening: Recommended annual dental exams for proper oral hygiene  Community Resource Referral / Chronic Care Management: CRR required this visit?  No   CCM required this visit?  No      Plan:     I have personally reviewed and noted the following in the patient's chart:   Medical and social history Use of alcohol, tobacco or illicit drugs  Current medications and supplements including opioid prescriptions. Patient is not currently taking opioid prescriptions. Functional ability and status Nutritional status Physical activity Advanced  directives List of other physicians Hospitalizations, surgeries, and ER visits in previous 12 months Vitals Screenings to include cognitive, depression, and falls Referrals and appointments  In addition, I have reviewed and discussed with patient certain preventive protocols, quality metrics, and best practice recommendations. A written personalized care plan for preventive services as well as general preventive health recommendations were provided to patient.     Kellie Simmering, LPN   [id number]   Nurse Notes: none  Due to this being a virtual visit, the after visit summary with patients personalized plan was offered to patient via mail or my-chart.  Patient would like to access on my-chart

## 2022-09-10 ENCOUNTER — Other Ambulatory Visit: Payer: Self-pay | Admitting: Physician Assistant

## 2022-09-24 ENCOUNTER — Ambulatory Visit: Payer: Medicare HMO | Admitting: Physician Assistant

## 2022-09-24 ENCOUNTER — Encounter: Payer: Self-pay | Admitting: Physician Assistant

## 2022-09-24 DIAGNOSIS — F17200 Nicotine dependence, unspecified, uncomplicated: Secondary | ICD-10-CM

## 2022-09-24 DIAGNOSIS — F411 Generalized anxiety disorder: Secondary | ICD-10-CM

## 2022-09-24 DIAGNOSIS — F319 Bipolar disorder, unspecified: Secondary | ICD-10-CM | POA: Diagnosis not present

## 2022-09-24 MED ORDER — GABAPENTIN 100 MG PO CAPS: 100.0000 mg | ORAL_CAPSULE | Freq: Every day | ORAL | 3 refills | Status: DC

## 2022-09-24 NOTE — Progress Notes (Signed)
[facility]  Patient ID: Amanda [name],  MRN: [id number]  PCP: Tollie Eth, NP  Date of Evaluation: 09/24/2022 Time spent:20 minutes  Chief Complaint:  Chief Complaint   Follow-up    HISTORY/CURRENT STATUS: HPI For routine med check.   Doing  well. Meds are still effective. Still smoking, not ready to quit. Unable to work d/t mental and physical health reasons.   Patient is able to enjoy things.  Energy and motivation are good.   No extreme sadness, tearfulness, or feelings of hopelessness.  Sleeps well most of the time. ADLs and personal hygiene are normal.   Denies any changes in concentration, making decisions, or remembering things.  Appetite has not changed.  Weight is stable. Doesn't usually have much anxiety.  Denies suicidal or homicidal thoughts.  Patient denies increased energy with decreased need for sleep, increased talkativeness, racing thoughts, impulsivity or risky behaviors, increased spending, increased libido, grandiosity, increased irritability or anger, paranoia, or hallucinations.  Denies dizziness, syncope, seizures, numbness, tingling, tremor, tics, unsteady gait, slurred speech, confusion. Denies muscle or joint pain, stiffness, or dystonia.  Individual Medical History/ Review of Systems: Changes? :No     Past medications for mental health diagnoses include: Depakote, Lamictal, Abilify, Xanax, Celexa, Risperdal, lithium  Allergies: Augmentin [amoxicillin-pot clavulanate], Doxycycline, Sumatriptan, and Doxycycline hyclate  Current Medications:  Current Outpatient Medications:    albuterol (VENTOLIN HFA) 108 (90 Base) MCG/ACT inhaler, Inhale 2 puffs into the lungs in the morning and at bedtime., Disp: , Rfl:    augmented betamethasone dipropionate (DIPROLENE-AF) 0.05 % cream, Apply 1 application topically 2 (two) times daily as needed (itching)., Disp: , Rfl:    baclofen (LIORESAL) 10 MG tablet, Take 10 mg by mouth 2 (two) times daily.,  Disp: , Rfl:    Buprenorphine HCl-Naloxone HCl 1.4-0.36 MG SUBL, Place 1-2 tablets under the tongue as directed. Take 2 tablets in the morning and Take 1 tablet in afternoon, Disp: , Rfl:    citalopram (CELEXA) 20 MG tablet, TAKE 1 TABLET AT BEDTIME, Disp: 90 tablet, Rfl: 3   diltiazem (CARDIZEM CD) 120 MG 24 hr capsule, TAKE 1 CAPSULE EVERY DAY, Disp: 90 capsule, Rfl: 3   diltiazem (CARDIZEM) 30 MG tablet, Take 1 tablet every 4 hours AS NEEDED for heart rate >90, Disp: 30 tablet, Rfl: 1   ezetimibe (ZETIA) 10 MG tablet, Take 10 mg by mouth daily., Disp: , Rfl:    Ibuprofen (MOTRIN PO), Take 800 mg by mouth 3 (three) times daily., Disp: , Rfl:    ketoconazole (NIZORAL) 2 % cream, APPLY TOPICALLY 2 (TWO) TIMES DAILY TO RING ON LEG TWICE A DAY., Disp: 60 g, Rfl: 2   lamoTRIgine (LAMICTAL) 100 MG tablet, TAKE 1 TABLET EVERY DAY, Disp: 90 tablet, Rfl: 3   ondansetron (ZOFRAN-ODT) 4 MG disintegrating tablet, ondansetron 4 mg disintegrating tablet, Disp: , Rfl:    risperiDONE (RISPERDAL) 1 MG tablet, TAKE 1 TABLET AT BEDTIME, Disp: 90 tablet, Rfl: 3   triamcinolone cream (KENALOG) 0.1 %, , Disp: , Rfl:    zonisamide (ZONEGRAN) 25 MG capsule, Take 100 mg by mouth at bedtime., Disp: , Rfl:    gabapentin (NEURONTIN) 100 MG capsule, Take 1 capsule (100 mg total) by mouth at bedtime., Disp: 90 capsule, Rfl: 3 Medication Side Effects: none  Family Medical/ Social History: Changes? No  MENTAL HEALTH EXAM:  There were no vitals taken for this visit.There is no height or weight on file to calculate BMI.  General Appearance: Casual,  Neat and Well Groomed  Eye Contact:  Good  Speech:  Normal Rate  Volume:  Normal  Mood:  Euthymic  Affect:  Appropriate  Thought Process:  Goal Directed and Descriptions of Associations: Circumstantial  Orientation:  Full (Time, Place, and Person)  Thought Content: Logical   Suicidal Thoughts:  No  Homicidal Thoughts:  No  Memory:  WNL  Judgement:  Good  Insight:  Good   Psychomotor Activity:  Normal  Concentration:  Concentration: Good and Attention Span: Good  Recall:  Good  Fund of Knowledge: Good  Language: Good  Assets:  Desire for Improvement Financial Resources/Insurance Housing Resilience Transportation  ADL's:  Intact  Cognition: WNL  Prognosis:  Good   PCP follows labs.   DIAGNOSES:  No diagnosis found.  Receiving Psychotherapy: No   RECOMMENDATIONS:  PDMP reviewed. [facility] 08/27/2022. I provided 20 minutes of face to face time during this encounter, including time spent before and after the visit in records review, medical decision making, counseling pertinent to today's visit, and charting.   Smoking cessation briefly discussed.   [name] doing well w/ mental health meds so no changes made.  Continue Celexa 20 mg qd. Continue Gabapentin 100 mg qhs. Continue Lamictal 100 mg, 1 qhs. Continue Risperdal 1 mg, 1 qhs.  Return in 9 months.  Melony Overly, PA-C

## 2022-10-08 IMAGING — DX DG CHEST 2V
2 series · 2 of 2 positions shown · non-contrast
Comparison: Chest x-ray dated June 05, 2021.

CLINICAL DATA: Tachycardia shortness of breath for the past 2 days.

EXAM:
CHEST - 2 VIEW

[chest pa]
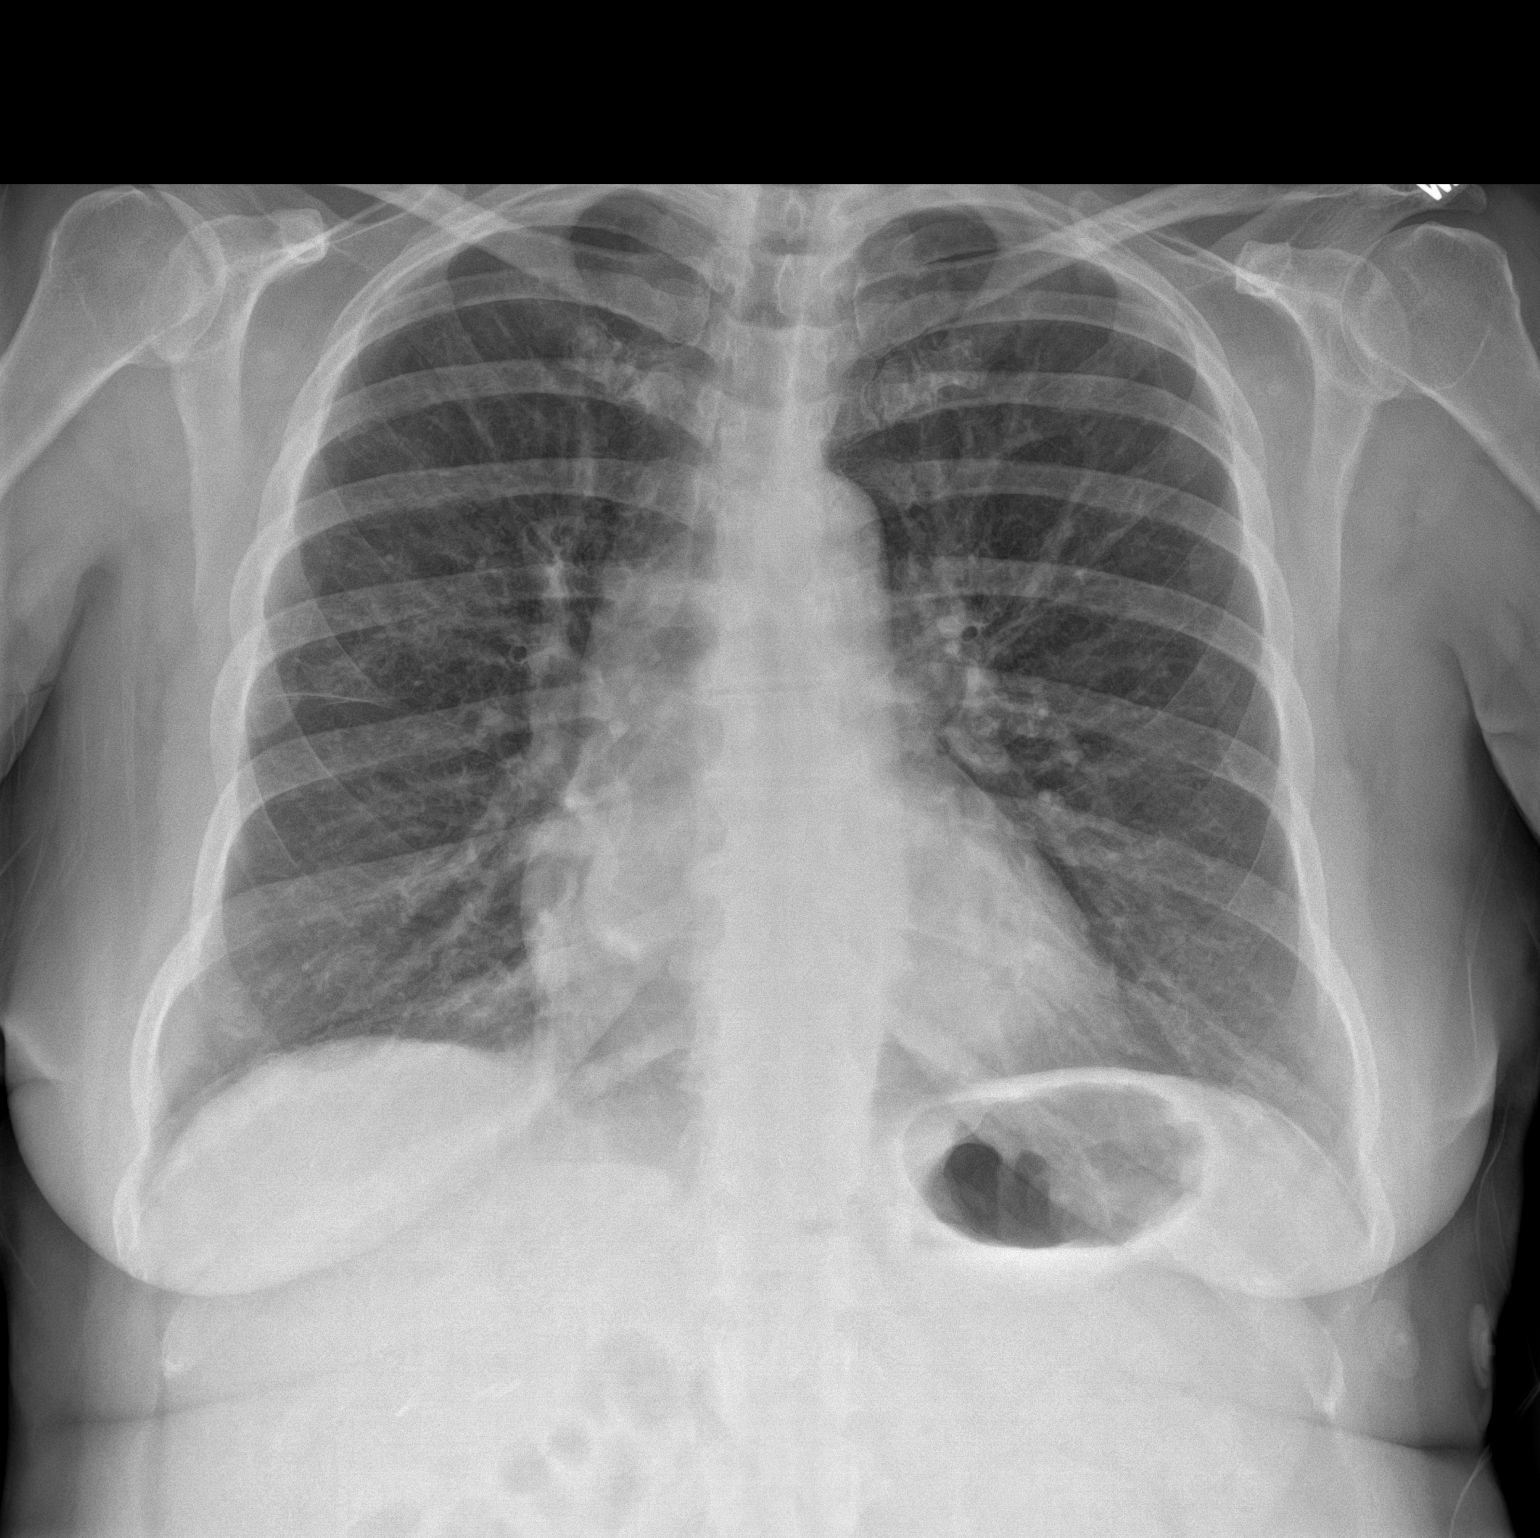

[chest lat]
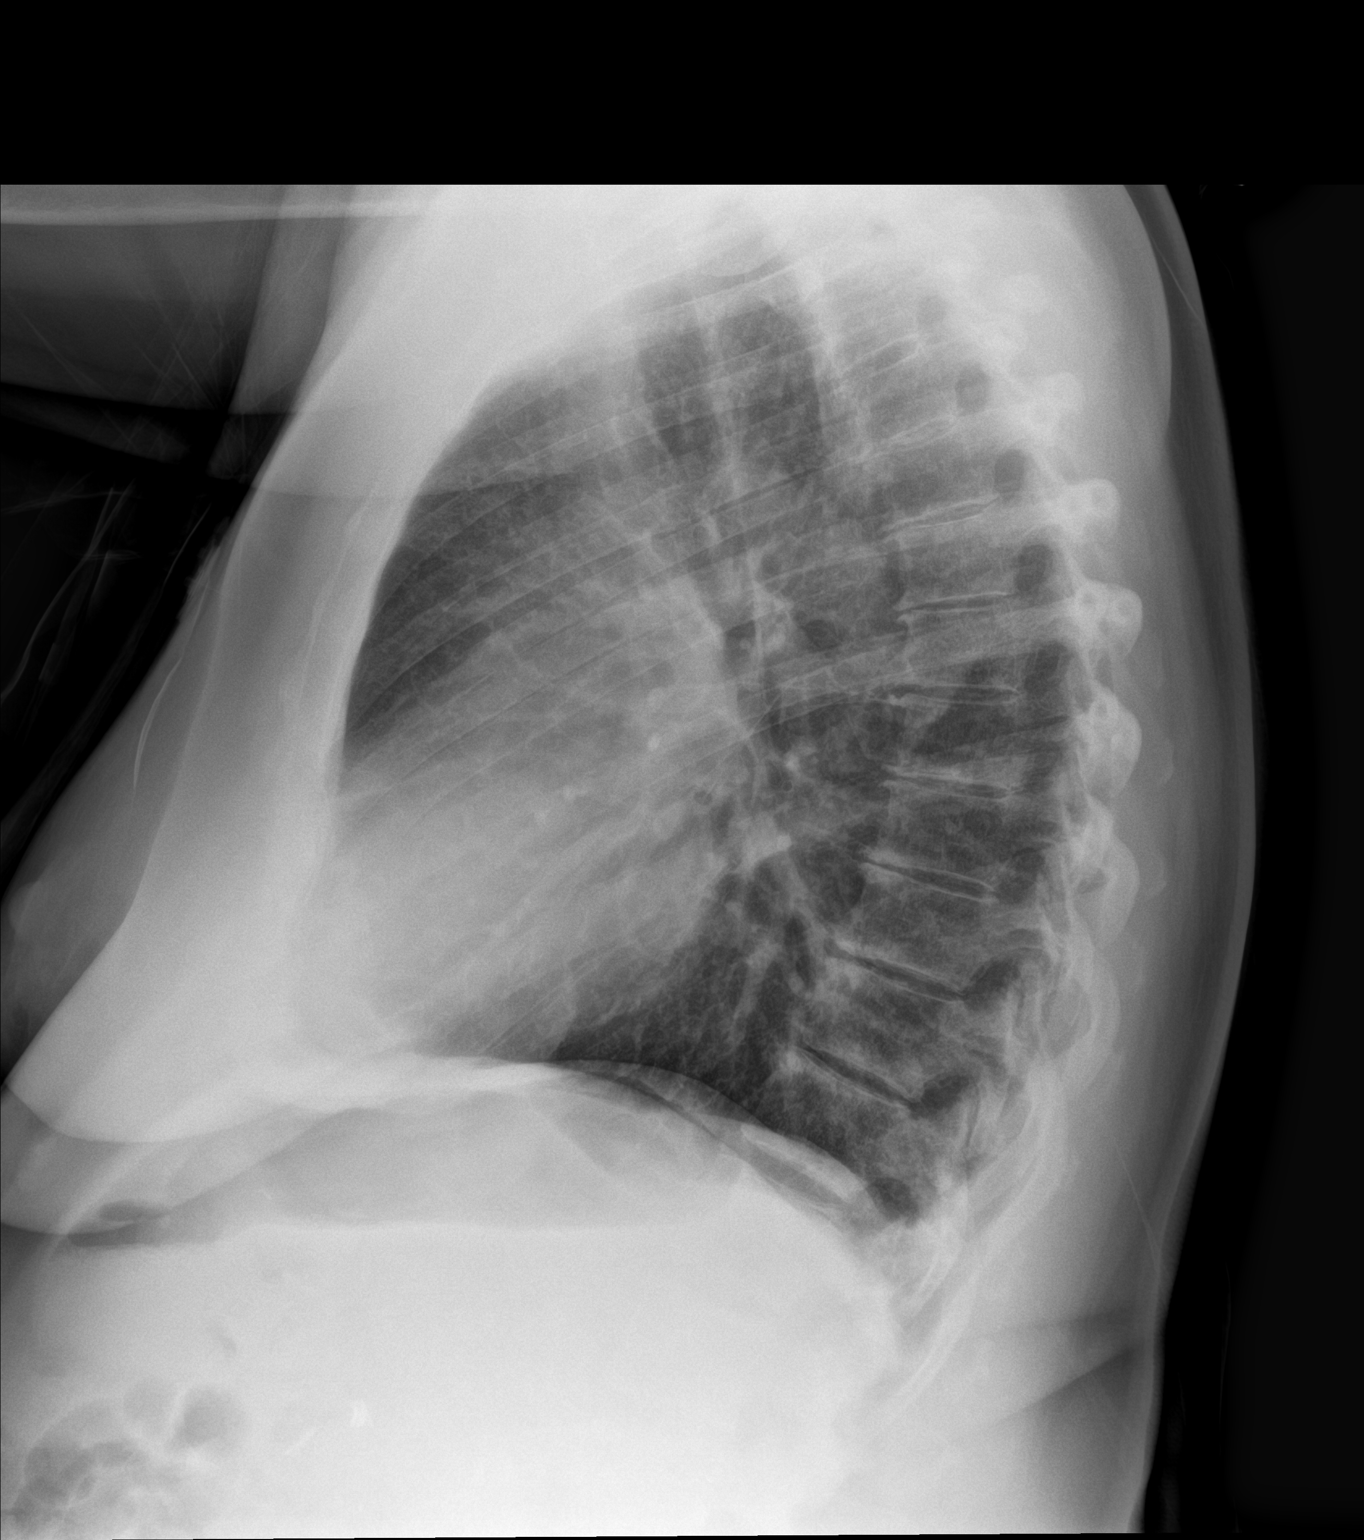

[2 of 2 positions shown; findings below may reference images not displayed]

FINDINGS: The heart size and mediastinal contours are within normal limits.
Both lungs are clear. The visualized skeletal structures are
unremarkable.
IMPRESSION: No active cardiopulmonary disease.

## 2022-11-26 DIAGNOSIS — F419 Anxiety disorder, unspecified: Secondary | ICD-10-CM | POA: Insufficient documentation

## 2022-11-26 LAB — HM COLONOSCOPY

## 2022-12-07 ENCOUNTER — Ambulatory Visit: Payer: Medicare HMO | Admitting: Nurse Practitioner

## 2022-12-07 ENCOUNTER — Encounter: Payer: Self-pay | Admitting: Nurse Practitioner

## 2022-12-07 VITALS — BP 142/66 | HR 76 | Temp 98.3°F | Resp 18 | Wt 168.8 lb

## 2022-12-07 DIAGNOSIS — R233 Spontaneous ecchymoses: Secondary | ICD-10-CM

## 2022-12-07 DIAGNOSIS — G43B1 Ophthalmoplegic migraine, intractable: Secondary | ICD-10-CM

## 2022-12-07 MED ORDER — NURTEC 75 MG PO TBDP: ORAL_TABLET | ORAL | 2 refills | Status: DC

## 2022-12-07 MED ORDER — METHYLPREDNISOLONE SODIUM SUCC 125 MG IJ SOLR
62.5000 mg | Freq: Once | INTRAMUSCULAR | Status: AC
  Administered 2022-12-07: 62.5 mg via INTRAMUSCULAR

## 2022-12-07 MED ORDER — KETOROLAC TROMETHAMINE 60 MG/2ML IM SOLN
60.0000 mg | Freq: Once | INTRAMUSCULAR | Status: AC
  Administered 2022-12-07: 60 mg via INTRAMUSCULAR

## 2022-12-07 MED ORDER — METHYLPREDNISOLONE SODIUM SUCC 125 MG IJ SOLR: 62.5000 mg | Freq: Once | INTRAMUSCULAR | Status: DC

## 2022-12-07 NOTE — Patient Instructions (Addendum)
I have given you toradol and steroid to help with your headache.   I will let you know about your vitamin D and if we need to start the high dose treatment. I really feel like this could be the cause of the bruising.

## 2022-12-07 NOTE — Progress Notes (Signed)
Tollie Eth, DNP, AGNP-c [facility] [address] [place], [place] [address] [phone]   ACUTE VISIT- ESTABLISHED PATIENT  Blood pressure (!) 142/66, pulse 76, temperature 98.3 F (36.8 C), temperature source Oral, resp. rate 18, weight 168 lb 12.8 oz (76.6 kg), SpO2 93%.  Subjective:  HPI Amanda [name] is a 67 y.o. female presents to day for evaluation of: bruising and thin skin.   The patient presents with a chief complaint of a constant headache that started last week. The headache is intermittent, worsening when looking to the left, and causing pain during activities such as brushing teeth and smiling. Amanda [name] has a history of migraines and previously visited a headache center. She is requesting Nurtec ODT for headache relief.  At home treatment with ibuprofen and tylenol has not been effective. Pain is currently 9/10  Asa also reports experiencing skin discoloration and bruising on the arms, which began four years ago after receiving the COVID vaccine. The skin discoloration worsens with sun exposure and scratching. Her cousin has a similar condition. She is concerned about low vitamin D levels and is open to taking supplements.  Additionally, Amanda [name] tells me ketoconazole prescription has been effective in treating fungal infection on the feet and improving their appearance and texture.  PMH, Medications, and Allergies reviewed and updated in chart as appropriate.   ROS negative except for what is listed in HPI. Objective:  Physical Exam Vitals and nursing note reviewed.  Constitutional:      Appearance: Normal appearance. She is ill-appearing.  HENT:     Head: Normocephalic and atraumatic.  Eyes:     Conjunctiva/sclera: Conjunctivae normal.     Pupils: Pupils are equal, round, and reactive to light.  Neck:     Vascular: No carotid bruit.  Cardiovascular:     Rate and Rhythm: Normal rate and regular rhythm.     Pulses: Normal pulses.      Heart sounds: Normal heart sounds.  Pulmonary:     Effort: Pulmonary effort is normal.     Breath sounds: Normal breath sounds.  Musculoskeletal:        General: Normal range of motion.     Cervical back: Normal range of motion.  Skin:    General: Skin is warm.     Capillary Refill: Capillary refill takes less than 2 seconds.  Neurological:     General: No focal deficit present.     Mental Status: She is alert and oriented to person, place, and time.     Cranial Nerves: No cranial nerve deficit.     Sensory: No sensory deficit.     Motor: No weakness.  Psychiatric:        Mood and Affect: Mood normal.        Behavior: Behavior normal.         Assessment & Plan:   Problem List Items Addressed This Visit     Intractable ophthalmoplegic migraine - Primary    Amanda [name] has been experiencing intermittent headaches for the past week that have now become constant and severe. She has a history of migraines that are worsened by looking to the left and causing difficulty in performing daily activities.  Plan: - Administer Toradol injection and a steroid injection for immediate relief - Prescribe Nurtec ODT for migraine management, pending insurance prior authorization   - Provide a sample of Ubrelvy for the patient to try as an alternative migraine medication      Easy bruising    Amanda [name]  reports skin discoloration, easy bruising, and sensitivity to sun exposure, possibly related to low vitamin D levels.  Plan: - Order blood tests to check vitamin D levels and platelet count - Recommend starting over-the-counter vitamin D supplementation - Consider prescribing high-dose vitamin D if levels are found to be significantly low      Relevant Orders   CBC with Differential/Platelet (Completed)   Comprehensive metabolic panel (Completed)   VITAMIN D 25 Hydroxy (Vit-D Deficiency, Fractures) (Completed)      Time: 29 minutes, >50% spent counseling, care coordination, chart review, and  documentation.    Tollie Eth, DNP, AGNP-c   History, Medications, Surgery, SDOH, and Family History reviewed and updated as appropriate.

## 2022-12-08 ENCOUNTER — Telehealth: Payer: Self-pay | Admitting: Internal Medicine

## 2022-12-08 LAB — COMPREHENSIVE METABOLIC PANEL
ALT: 11 IU/L (ref 0–32)
AST: 16 IU/L (ref 0–40)
Albumin: 4.3 g/dL (ref 3.9–4.9)
Alkaline Phosphatase: 85 IU/L (ref 44–121)
BUN/Creatinine Ratio: 19 (ref 12–28)
BUN: 16 mg/dL (ref 8–27)
Bilirubin Total: <0.2 mg/dL (ref 0.0–1.2)
CO2: 27 mmol/L (ref 20–29)
Calcium: 9.4 mg/dL (ref 8.7–10.3)
Chloride: 104 mmol/L (ref 96–106)
Creatinine, Ser: 0.83 mg/dL (ref 0.57–1.00)
Globulin, Total: 2.7 g/dL (ref 1.5–4.5)
Glucose: 102 mg/dL — ABNORMAL HIGH (ref 70–99)
Potassium: 4.7 mmol/L (ref 3.5–5.2)
Sodium: 145 mmol/L — ABNORMAL HIGH (ref 134–144)
Total Protein: 7 g/dL (ref 6.0–8.5)
eGFR: 79 mL/min/{1.73_m2} (ref >59)

## 2022-12-08 LAB — CBC WITH DIFFERENTIAL/PLATELET
Basophils Absolute: 0.1 10*3/uL (ref 0.0–0.2)
Basos: 1 %
EOS (ABSOLUTE): 0.1 10*3/uL (ref 0.0–0.4)
Eos: 1 %
Hematocrit: 42.6 % (ref 34.0–46.6)
Hemoglobin: 13.8 g/dL (ref 11.1–15.9)
Immature Grans (Abs): 0.1 10*3/uL (ref 0.0–0.1)
Immature Granulocytes: 1 %
Lymphocytes Absolute: 2.3 10*3/uL (ref 0.7–3.1)
Lymphs: 17 %
MCH: 28.2 pg (ref 26.6–33.0)
MCHC: 32.4 g/dL (ref 31.5–35.7)
MCV: 87 fL (ref 79–97)
Monocytes Absolute: 0.8 10*3/uL (ref 0.1–0.9)
Monocytes: 6 %
Neutrophils Absolute: 10.6 10*3/uL — ABNORMAL HIGH (ref 1.4–7.0)
Neutrophils: 74 %
Platelets: 293 10*3/uL (ref 150–450)
RBC: 4.89 x10E6/uL (ref 3.77–5.28)
RDW: 13.3 % (ref 11.7–15.4)
WBC: 14 10*3/uL — ABNORMAL HIGH (ref 3.4–10.8)

## 2022-12-08 LAB — VITAMIN D 25 HYDROXY (VIT D DEFICIENCY, FRACTURES): Vit D, 25-Hydroxy: 26 ng/mL — ABNORMAL LOW (ref 30.0–100.0)

## 2022-12-08 NOTE — Telephone Encounter (Signed)
P.A for Nurtec 75mg  has been sent through covermymeds. Waiting on response

## 2022-12-09 ENCOUNTER — Other Ambulatory Visit: Payer: Self-pay | Admitting: Nurse Practitioner

## 2022-12-09 ENCOUNTER — Telehealth: Payer: Self-pay | Admitting: Nurse Practitioner

## 2022-12-09 DIAGNOSIS — E559 Vitamin D deficiency, unspecified: Secondary | ICD-10-CM

## 2022-12-09 DIAGNOSIS — D729 Disorder of white blood cells, unspecified: Secondary | ICD-10-CM

## 2022-12-09 MED ORDER — VITAMIN D (ERGOCALCIFEROL) 1.25 MG (50000 UNIT) PO CAPS: 50000.0000 [IU] | ORAL_CAPSULE | ORAL | 3 refills | Status: DC

## 2022-12-09 NOTE — Telephone Encounter (Signed)
Pt called to see if you have reviewed her labs and if you were going to send her in some vitamin D if needed based off result. She uses [facility] - [place], [place] - [address]

## 2022-12-11 ENCOUNTER — Encounter: Payer: Self-pay | Admitting: Internal Medicine

## 2022-12-28 ENCOUNTER — Other Ambulatory Visit (HOSPITAL_COMMUNITY): Payer: Self-pay | Admitting: *Deleted

## 2022-12-28 MED ORDER — DILTIAZEM HCL 30 MG PO TABS: ORAL_TABLET | ORAL | 1 refills | Status: DC

## 2022-12-29 ENCOUNTER — Telehealth (HOSPITAL_COMMUNITY): Payer: Self-pay | Admitting: *Deleted

## 2022-12-29 MED ORDER — APIXABAN 5 MG PO TABS: 5.0000 mg | ORAL_TABLET | Freq: Two times a day (BID) | ORAL | 2 refills | Status: DC

## 2022-12-29 NOTE — Telephone Encounter (Signed)
Patient has been in afib since 7/22 AM when she woke up. She started taking PRN cardizem for rates around 100 but they were expired. She received a new RX of PRN cardizem but afib has continued HR currently is 90 BP 166/75. No symptoms other than palpations. Denies shortness of breath, chest pain. Discussed with Jorja Loa PA will start Eliquis 5mg  BID since out of rhythm greater than 24 hours. Appt made for next week. Pt will continue using PRN cardizem for elevated rates. ER precautions were reviewed.

## 2023-01-04 ENCOUNTER — Telehealth: Payer: Self-pay | Admitting: Nurse Practitioner

## 2023-01-04 NOTE — Telephone Encounter (Signed)
APPEAL NURTEC filed, pt may need trial of Ubrelvy

## 2023-01-04 NOTE — Telephone Encounter (Signed)
P.A. denied, for Nurtec, for triptan trial but was done incorrectly as pt has tried triptans.  I will resend in

## 2023-01-07 ENCOUNTER — Ambulatory Visit (HOSPITAL_COMMUNITY): Payer: Medicare HMO | Admitting: Internal Medicine

## 2023-01-17 DIAGNOSIS — R233 Spontaneous ecchymoses: Secondary | ICD-10-CM

## 2023-01-17 DIAGNOSIS — G43711 Chronic migraine without aura, intractable, with status migrainosus: Secondary | ICD-10-CM | POA: Insufficient documentation

## 2023-01-17 DIAGNOSIS — G43B1 Ophthalmoplegic migraine, intractable: Secondary | ICD-10-CM | POA: Insufficient documentation

## 2023-01-17 HISTORY — DX: Spontaneous ecchymoses: R23.3

## 2023-01-17 NOTE — Assessment & Plan Note (Signed)
Amanda [name] has been experiencing intermittent headaches for the past week that have now become constant and severe. She has a history of migraines that are worsened by looking to the left and causing difficulty in performing daily activities.  Plan: - Administer Toradol injection and a steroid injection for immediate relief - Prescribe Nurtec ODT for migraine management, pending insurance prior authorization   - Provide a sample of Ubrelvy for the patient to try as an alternative migraine medication

## 2023-01-17 NOTE — Assessment & Plan Note (Signed)
Amanda [name] reports skin discoloration, easy bruising, and sensitivity to sun exposure, possibly related to low vitamin D levels.  Plan: - Order blood tests to check vitamin D levels and platelet count - Recommend starting over-the-counter vitamin D supplementation - Consider prescribing high-dose vitamin D if levels are found to be significantly low

## 2023-01-17 NOTE — Assessment & Plan Note (Signed)
>>  ASSESSMENT AND PLAN FOR INTRACTABLE OPHTHALMOPLEGIC MIGRAINE WRITTEN ON 01/17/2023  1:49 PM BY Trentan Trippe E, NP  Kobie has been experiencing intermittent headaches for the past week that have now become constant and severe. She has a history of migraines that are worsened by looking to the left and causing difficulty in performing daily activities.  Plan: - Administer Toradol  injection and a steroid injection for immediate relief - Prescribe Nurtec ODT for migraine management, pending insurance prior authorization   - Provide a sample of Ubrelvy  for the patient to try as an alternative migraine medication

## 2023-01-18 ENCOUNTER — Ambulatory Visit (HOSPITAL_COMMUNITY)
Admission: RE | Admit: 2023-01-18 | Discharge: 2023-01-18 | Disposition: A | Discharge status: Home / Self Care | Payer: Medicare HMO | Source: Ambulatory Visit | Attending: Internal Medicine | Admitting: Internal Medicine

## 2023-01-18 ENCOUNTER — Encounter (HOSPITAL_COMMUNITY): Payer: Self-pay | Admitting: Internal Medicine

## 2023-01-18 VITALS — BP 122/84 | HR 65 | Ht 63.0 in | Wt 171.8 lb

## 2023-01-18 DIAGNOSIS — I480 Paroxysmal atrial fibrillation: Secondary | ICD-10-CM | POA: Diagnosis present

## 2023-01-18 DIAGNOSIS — Z7901 Long term (current) use of anticoagulants: Secondary | ICD-10-CM | POA: Insufficient documentation

## 2023-01-18 NOTE — Progress Notes (Signed)
Primary Care Physician: Tollie Eth, NP Referring Physician: Dr. Dietrich Pates Cardiologist: Dr. Samara Snide Amanda [name] is a 67 y.o. female with a h/o  paroxysmal afib that was seen in the ER yesterday with controlled v rates. ER staff opted not to cardiovert her. They spoke to Dr. Tenny Craw and she recommended f/u in the afib clinic today.  Pt was d/c in rate controlled afib but fortunately has returned to SR today. Pt is unaware of when she went back to SR.  She reports that she snores, apnea noted and sleeps in a recliner because of this.  No previous sleep study. She does smoke a pack a day for years, minimal caffeine use and denies alcohol use. She currently takes 120 mg diltiazem daily. She is not on anticoagulation for a CHA2DS2VASc  score of 1-2. Pt denies HTN as a medical problem.    F/u in afib clinic 10/29/21. She is in SR and not noted any afib. She decided she does not want to go thru sleep study at this time. Due to interaction with her meds with most  antiarrythmic's, if her afib burden increases,  she will most likely be an ablation candidate.   F/u in Afib clinic, 01/18/23. She called office noting to be in Afib since 7/22. She was started on Eliquis 5 mg BID on 7/23 due to being in Afib > 24 hours. She is here today in normal rhythm. She felt she converted to normal rhythm the day of or after her phone call to clinic. She had been taking prn diltiazem in addition to her diltiazem 120 mg daily. She feels palpitations as primary symptom historically. She has been taking diltiazem PRN. No missed doses of Eliquis. She does not drink alcohol. She drinks 1 cup of coffee daily.   Today, she denies symptoms of palpitations, chest pain, shortness of breath, orthopnea, PND, lower extremity edema, dizziness, presyncope, syncope, or neurologic sequela. The patient is tolerating medications without difficulties and is otherwise without complaint today.   Past Medical History:  Diagnosis  Date   Atrial fibrillation [facility])    DEGENERATIVE JOINT DISEASE 11/12/2006   DISORDER, BIPOLAR NOS 02/28/2007   GERD 11/12/2006   Major depressive disorder, single episode, unspecified 07/15/2021   Past Surgical History:  Procedure Laterality Date   ABDOMINAL HYSTERECTOMY     BREAST SURGERY     reduction   KNEE ARTHROSCOPY     TONSILLECTOMY      Current Outpatient Medications  Medication Sig Dispense Refill   albuterol (VENTOLIN HFA) 108 (90 Base) MCG/ACT inhaler Inhale 2 puffs into the lungs in the morning and at bedtime.     apixaban (ELIQUIS) 5 MG TABS tablet Take 1 tablet (5 mg total) by mouth 2 (two) times daily. 60 tablet 2   augmented betamethasone dipropionate (DIPROLENE-AF) 0.05 % cream Apply 1 application topically 2 (two) times daily as needed (itching).     baclofen (LIORESAL) 10 MG tablet Take 10 mg by mouth 2 (two) times daily.     betamethasone dipropionate 0.05 % cream      Buprenorphine HCl-Naloxone HCl 1.4-0.36 MG SUBL Place 1-2 tablets under the tongue as directed. Take 2 tablets in the morning and Take 1 tablet in afternoon     citalopram (CELEXA) 20 MG tablet TAKE 1 TABLET AT BEDTIME 90 tablet 3   diltiazem (CARDIZEM CD) 120 MG 24 hr capsule TAKE 1 CAPSULE EVERY DAY 90 capsule 3   diltiazem (CARDIZEM) 30 MG  tablet Take 1 tablet every 4 hours AS NEEDED for heart rate >90 30 tablet 1   estradiol (VIVELLE-DOT) 0.1 MG/24HR patch as needed.     ezetimibe (ZETIA) 10 MG tablet Take 10 mg by mouth daily.     gabapentin (NEURONTIN) 100 MG capsule Take 1 capsule (100 mg total) by mouth at bedtime. 90 capsule 3   ketoconazole (NIZORAL) 2 % cream APPLY TOPICALLY 2 (TWO) TIMES DAILY TO RING ON LEG TWICE A DAY. 60 g 2   lamoTRIgine (LAMICTAL) 100 MG tablet TAKE 1 TABLET EVERY DAY 90 tablet 3   lansoprazole (PREVACID) 30 MG capsule Take 30 mg by mouth daily.     ondansetron (ZOFRAN) 4 MG tablet as needed.     ondansetron (ZOFRAN-ODT) 4 MG disintegrating tablet ondansetron 4 mg  disintegrating tablet     risperiDONE (RISPERDAL) 1 MG tablet TAKE 1 TABLET AT BEDTIME 90 tablet 3   triamcinolone cream (KENALOG) 0.1 % daily as needed.     Vitamin D, Ergocalciferol, (DRISDOL) 1.25 MG (50000 UNIT) CAPS capsule Take 1 capsule (50,000 Units total) by mouth every 7 (seven) days. 12 capsule 3   zonisamide (ZONEGRAN) 25 MG capsule Take 100 mg by mouth at bedtime.     No current facility-administered medications for this encounter.    Allergies  Allergen Reactions   Augmentin [Amoxicillin-Pot Clavulanate] Other (See Comments)    Has patient had a PCN reaction causing immediate rash, facial/tongue/throat swelling, SOB or lightheadedness with hypotension: No Has patient had a PCN reaction causing severe rash involving mucus membranes or skin necrosis: No Has patient had a PCN reaction that required hospitalization: No Has patient had a PCN reaction occurring within the last 10 years: No If all of the above answers are "NO", then may proceed with Cephalosporin use.    Sumatriptan Other (See Comments)   Doxycycline Hyclate Hives and Rash    REACTION: rash / hives   ROS- All systems are reviewed and negative except as per the HPI above  Physical Exam: Vitals:   01/18/23 1415  BP: 122/84  Pulse: 65  Weight: 77.9 kg  Height: 5\' 3"  (1.6 m)    Wt Readings from Last 3 Encounters:  01/18/23 77.9 kg  12/07/22 76.6 kg  09/01/22 77.1 kg    Labs: Lab Results  Component Value Date   NA 145 (H) 12/07/2022   K 4.7 12/07/2022   CL 104 12/07/2022   CO2 27 12/07/2022   GLUCOSE 102 (H) 12/07/2022   BUN 16 12/07/2022   CREATININE 0.83 12/07/2022   CALCIUM 9.4 12/07/2022   MG 2.1 07/03/2017   No results found for: "INR" Lab Results  Component Value Date   CHOL 206 (H) 07/15/2021   HDL 43 07/15/2021   LDLCALC 140 (H) 07/15/2021   TRIG 126 07/15/2021    GEN- The patient is well appearing, alert and oriented x 3 today.   Neck - no JVD or carotid bruit noted Lungs-  Clear to ausculation bilaterally, normal work of breathing Heart- Regular rate and rhythm, no murmurs, rubs or gallops, PMI not laterally displaced Extremities- no clubbing, cyanosis, or edema Skin - no rash or ecchymosis noted  EKG- Vent. rate 65 BPM PR interval 140 ms QRS duration 82 ms QT/QTcB 406/422 ms P-R-T axes 67 38 18 Normal sinus rhythm Low voltage QRS Borderline ECG When compared with ECG of 16-Mar-2022 10:09, PREVIOUS ECG IS PRESENT  Echo- 6 2022-1. Technically difficlut echo with poor image quality.   2. Left  ventricular ejection fraction, by estimation, is 60 to 65%. The  left ventricle has normal function. The left ventricle has no regional  wall motion abnormalities. Left ventricular diastolic parameters were  normal.   3. Right ventricular systolic function is normal. The right ventricular  size is normal.   4. The mitral valve was not well visualized. No evidence of mitral valve  regurgitation.   5. The aortic valve was not well visualized. Aortic valve regurgitation  is not visualized. No aortic stenosis is present.   Assessment and Plan:  1. Paroxysmal afib Overall low historical burden  She is currently in NSR.  Continue conservative observation for now. We briefly discussed ablation as an option going forward if indicated. She is very hesitant about ablation since it's a procedure. However, it seems like she would choose procedure over AAD since she doesn't want to change her bipolar medication regimen.  Previous discussion on AAD treatment options:  Here are the thoughts of Margaretmary Dys, PharmD - "[name] on a lot of QTc prolonging meds. Flecainide will interact with celexa and risperidone, multaq is contraindicated with celexa and also interacts with risperidone. Celexa is one of the most qtc prolonging SSRIs, not sure if that could be changed to something else by PCP? then either option might be ok but would still need close EKG monitoring bc of the  interaction with risperidone"  2. CHA2DS2VASc  score of 1-2 ( pt denies BP dx)  She will take Eliquis for one month s/p spontaneous conversion and then discontinue.    F/u 6 months Afib clinic.   Lake Bells, PA-C Afib Clinic [facility] [address] [place], [place] [address] [phone]

## 2023-01-19 NOTE — Telephone Encounter (Signed)
Nurtec denied, pt needs trial of Ubrelvy, can pt be switched?

## 2023-02-02 ENCOUNTER — Telehealth: Payer: Self-pay | Admitting: Nurse Practitioner

## 2023-02-02 MED ORDER — UBRELVY 50 MG PO TABS: 1.0000 | ORAL_TABLET | Freq: Two times a day (BID) | ORAL | 2 refills | Status: DC

## 2023-02-02 NOTE — Telephone Encounter (Signed)
Called pharmacy & Bernita Raisin requires P.A.

## 2023-02-02 NOTE — Telephone Encounter (Signed)
Sent in Amanda [name]

## 2023-02-02 NOTE — Telephone Encounter (Signed)
Was able to get approval for #16/30, called pharmacy & left message.  Called pt and informed of change in Rx

## 2023-02-02 NOTE — Telephone Encounter (Signed)
P.A. UBRELVY completed & max quantity is 16 per 30 days,  will call pharmacy back & change quantity after get P.A. approved

## 2023-02-12 ENCOUNTER — Other Ambulatory Visit: Payer: Medicare HMO

## 2023-02-17 ENCOUNTER — Other Ambulatory Visit: Payer: Medicare HMO

## 2023-02-17 DIAGNOSIS — D72829 Elevated white blood cell count, unspecified: Secondary | ICD-10-CM

## 2023-02-18 LAB — CBC WITH DIFFERENTIAL/PLATELET
Basophils Absolute: 0.1 10*3/uL (ref 0.0–0.2)
Basos: 1 %
EOS (ABSOLUTE): 0.1 10*3/uL (ref 0.0–0.4)
Eos: 1 %
Hematocrit: 40.7 % (ref 34.0–46.6)
Hemoglobin: 13.1 g/dL (ref 11.1–15.9)
Immature Grans (Abs): 0.1 10*3/uL (ref 0.0–0.1)
Immature Granulocytes: 1 %
Lymphocytes Absolute: 2.3 10*3/uL (ref 0.7–3.1)
Lymphs: 21 %
MCH: 28.1 pg (ref 26.6–33.0)
MCHC: 32.2 g/dL (ref 31.5–35.7)
MCV: 87 fL (ref 79–97)
Monocytes Absolute: 0.6 10*3/uL (ref 0.1–0.9)
Monocytes: 6 %
Neutrophils Absolute: 7.6 10*3/uL — ABNORMAL HIGH (ref 1.4–7.0)
Neutrophils: 70 %
Platelets: 280 10*3/uL (ref 150–450)
RBC: 4.67 x10E6/uL (ref 3.77–5.28)
RDW: 12 % (ref 11.7–15.4)
WBC: 10.8 10*3/uL (ref 3.4–10.8)

## 2023-03-12 ENCOUNTER — Ambulatory Visit: Payer: Medicare HMO | Admitting: Physician Assistant

## 2023-03-17 ENCOUNTER — Other Ambulatory Visit: Payer: Medicare HMO

## 2023-03-17 DIAGNOSIS — D72829 Elevated white blood cell count, unspecified: Secondary | ICD-10-CM

## 2023-03-17 LAB — CBC WITH DIFFERENTIAL/PLATELET
Basophils Absolute: 0.1 10*3/uL (ref 0.0–0.2)
Basos: 1 %
EOS (ABSOLUTE): 0.1 10*3/uL (ref 0.0–0.4)
Eos: 1 %
Hematocrit: 41.7 % (ref 34.0–46.6)
Hemoglobin: 13.4 g/dL (ref 11.1–15.9)
Immature Grans (Abs): 0.1 10*3/uL (ref 0.0–0.1)
Immature Granulocytes: 1 %
Lymphocytes Absolute: 2 10*3/uL (ref 0.7–3.1)
Lymphs: 20 %
MCH: 28.1 pg (ref 26.6–33.0)
MCHC: 32.1 g/dL (ref 31.5–35.7)
MCV: 87 fL (ref 79–97)
Monocytes Absolute: 0.7 10*3/uL (ref 0.1–0.9)
Monocytes: 7 %
Neutrophils Absolute: 7.3 10*3/uL — ABNORMAL HIGH (ref 1.4–7.0)
Neutrophils: 70 %
Platelets: 290 10*3/uL (ref 150–450)
RBC: 4.77 x10E6/uL (ref 3.77–5.28)
RDW: 12.1 % (ref 11.7–15.4)
WBC: 10.3 10*3/uL (ref 3.4–10.8)

## 2023-03-24 ENCOUNTER — Encounter: Payer: Self-pay | Admitting: Neurology

## 2023-03-24 ENCOUNTER — Ambulatory Visit: Payer: Medicare HMO | Admitting: Neurology

## 2023-03-24 VITALS — BP 125/62 | HR 63 | Ht 62.0 in | Wt 174.2 lb

## 2023-03-24 DIAGNOSIS — M7918 Myalgia, other site: Secondary | ICD-10-CM | POA: Diagnosis not present

## 2023-03-24 DIAGNOSIS — G43711 Chronic migraine without aura, intractable, with status migrainosus: Secondary | ICD-10-CM | POA: Diagnosis not present

## 2023-03-24 MED ORDER — AJOVY 225 MG/1.5ML ~~LOC~~ SOAJ: 225.0000 mg | SUBCUTANEOUS | Status: DC

## 2023-03-24 NOTE — Progress Notes (Signed)
[facility]    Provider:  Dr Lucia Gaskins Requesting Provider: Santiago Glad, MD Primary Care Provider:  Arbie Cookey Sung Amabile, NP  CC:  Migraines  HPI:  Amanda [name] is a 67 y.o. female here as requested by Santiago Glad, MD for migraines.has Bipolar disorder (HCC); Gastroesophageal reflux disease; Osteoarthritis; Atrial fibrillation (HCC); Osteoarthritis of left knee; Asthma; Back pain; Carpal tunnel syndrome; Chronic pain; Hyperlipidemia, unspecified; Hypertension; Hypothyroid; Nicotine dependence; Other long term (current) drug therapy; Prediabetes; Vitamin B12 deficiency (non anemic); Pruritic disorder; Numerous skin moles; Annular dermatitis; Anxiety; Intractable ophthalmoplegic migraine; Easy bruising; and Chronic migraine without aura, with intractable migraine, so stated, with status migrainosus on their problem list.  Has a long history of migraines. Has been to Dr. Meryl Crutch many years ago, [facility] Neale Burly and multiple other physicians. She is taking excedrin migraine daily and we discussed medication overuse. The zonisamide did not help that Dr. Neale Burly tried but she has tried a plethora of medications over the decades. 16 severe migraines a mnth, 8 moderately severe headaches a month and 4 moderately severe migraines a month and 4 mild headaches a month. Pulsating and throbbing on the right, the parietal area, light and sound sensitivity, nausea, no vomiting. Zofran helps with the nausea. Feels like it rolling in the head messing with her. She has ahd migraines all her lfe. She doe snot wake up with them. Wakes up fine. But she  has COPD. She sits up in recliner to sleep. Migraines ongoing for years. Affecting her life. She cannot afford the newer medications we will try her on samples and see if we can get her patient assistance. No other focal neurologic deficits, associated symptoms, inciting events or modifiable factors.   Reviewed notes, labs  and imaging from outside physicians, which showed:  MRI brain:  CLINICAL DATA:  67 year old female with constant headaches since October 2023.   EXAM: MRI HEAD WITHOUT CONTRAST   TECHNIQUE: Multiplanar, multiecho pulse sequences of the brain and surrounding structures were obtained without intravenous contrast.   COMPARISON:  None Available.   FINDINGS: Brain: Cerebral volume is within normal limits for age. No restricted diffusion to suggest acute infarction. No midline shift, mass effect, evidence of mass lesion, ventriculomegaly, extra-axial collection or acute intracranial hemorrhage. Cervicomedullary junction and pituitary are within normal limits. Wallace Cullens and white matter signal is within normal limits for age throughout the brain. No encephalomalacia or chronic cerebral blood products identified. Deep gray nuclei, brainstem, and cerebellum appear normal.   Vascular: Major intracranial vascular flow voids are preserved. The distal right vertebral artery appears dominant, normal variant.   Skull and upper cervical spine: Some cervical spine disc degeneration is evident, but overall within normal limits for age. Visualized bone marrow signal is within normal limits.   Sinuses/Orbits: Orbits appear symmetric and normal. Paranasal sinuses and mastoids are well aerated.   Other: Grossly normal visible internal auditory structures. Negative visible scalp and face.   IMPRESSION: No acute intracranial abnormality and normal for age noncontrast MRI appearance of the Brain.     Electronically Signed   By: Odessa Fleming M.D.   On: 06/25/2022 13:50     Latest Ref Rng & Units 03/17/2023   12:09 PM 02/17/2023   11:40 AM 12/07/2022    3:59 PM  CBC  WBC 3.4 - 10.8 x10E3/uL 10.3  10.8  14.0   Hemoglobin 11.1 - 15.9 g/dL 65.7  84.6  96.2   Hematocrit 34.0 - 46.6 % 41.7  40.7  42.6   Platelets 150 - 450 x10E3/uL 290  280  293       Latest Ref Rng & Units 12/07/2022    3:59 PM  03/16/2022   10:32 AM 09/16/2021   12:36 PM  CMP  Glucose 70 - 99 mg/dL 161  096  045   BUN 8 - 27 mg/dL 16  20  24    Creatinine 0.57 - 1.00 mg/dL 4.09  8.11  9.14   Sodium 134 - 144 mmol/L 145  142  141   Potassium 3.5 - 5.2 mmol/L 4.7  4.3  4.5   Chloride 96 - 106 mmol/L 104  106  105   CO2 20 - 29 mmol/L 27  26  27    Calcium 8.7 - 10.3 mg/dL 9.4  9.0  9.2   Total Protein 6.0 - 8.5 g/dL 7.0     Total Bilirubin 0.0 - 1.2 mg/dL <7.8     Alkaline Phos 44 - 121 IU/L 85     AST 0 - 40 IU/L 16     ALT 0 - 32 IU/L 11        Review of Systems: Patient complains of symptoms per HPI as well as the following symptoms none. Pertinent negatives and positives per HPI. All others negative.   Social History   Socioeconomic History   Marital status: Married    Spouse name: Jillyn Hidden   Number of children: 1   Years of education: Not on file   Highest education level: Not on file  Occupational History   Not on file  Tobacco Use   Smoking status: Every Day    Current packs/day: 0.50    Average packs/day: 0.5 packs/day for 45.0 years (22.5 ttl pk-yrs)    Types: Cigarettes   Smokeless tobacco: Never  Vaping Use   Vaping status: Never Used  Substance and Sexual Activity   Alcohol use: No   Drug use: Yes    Frequency: 7.0 times per week    Types: Marijuana    Comment: daily marijuana use   Sexual activity: Not Currently    Partners: Male  Other Topics Concern   Not on file  Social History Narrative   Not on file   Social Determinants of Health   Financial Resource Strain: Low Risk  (11/11/2022)   Received from [facility], [facility]   Overall Financial Resource Strain (CARDIA)    Difficulty of Paying Living Expenses: Not hard at all  Food Insecurity: No Food Insecurity (11/11/2022)   Received from [facility], [facility]   Hunger Vital Sign    Worried About Running Out of Food in the Last Year: Never true    Ran Out of Food in the Last Year: Never true  Transportation  Needs: No Transportation Needs (11/11/2022)   Received from [facility], [facility]   PRAPARE - Transportation    Lack of Transportation (Medical): No    Lack of Transportation (Non-Medical): No  Physical Activity: Unknown (11/11/2022)   Received from [facility], [facility]   Exercise Vital Sign    Days of Exercise per Week: 0 days    Minutes of Exercise per Session: Not on file  Recent Concern: Physical Activity - Inactive (09/01/2022)   Exercise Vital Sign    Days of Exercise per Week: 0 days    Minutes of Exercise per Session: 0 min  Stress: No Stress Concern Present (11/11/2022)   Received [facility]  [facility]upational [facility] - Occupational Stress Questionnaire    Feeling of Stress : Not at all  Social Connections: Socially Integrated (11/11/2022)   Received from [facility], [facility]   Social Network    How would you rate your social network (family, work, friends)?: Good participation with social networks  Intimate Partner Violence: Not At Risk (11/11/2022)   Received from [facility], [facility]   HITS    Over the last 12 months how often did your partner physically hurt you?: 1    Over the last 12 months how often did your partner insult you or talk down to you?: 1    Over the last 12 months how often did your partner threaten you with physical harm?: 1    Over the last 12 months how often did your partner scream or curse at you?: 1    Family History  Problem Relation Age of Onset   Dementia Mother    Congestive Heart Failure Father    Migraines Father    Dementia Maternal Aunt    Dementia Maternal Aunt    Dementia Maternal Grandmother     Past Medical History:  Diagnosis Date   Atrial fibrillation (HCC)    DEGENERATIVE JOINT DISEASE 11/12/2006   DISORDER, BIPOLAR NOS 02/28/2007   GERD 11/12/2006   Major depressive disorder, single episode, unspecified 07/15/2021    Patient Active Problem List   Diagnosis  Date Noted   Chronic migraine without aura, with intractable migraine, so stated, with status migrainosus 03/24/2023   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   Easy bruising 01/17/2023   Anxiety 11/26/2022   Annular dermatitis 06/07/2022   Pruritic disorder 07/17/2021   Numerous skin moles 07/17/2021   Asthma 07/15/2021   Back pain 07/15/2021   Carpal tunnel syndrome 07/15/2021   Chronic pain 07/15/2021   Hyperlipidemia, unspecified 07/15/2021   Hypertension 07/15/2021   Hypothyroid 07/15/2021   Nicotine dependence 07/15/2021   Other long term (current) drug therapy 07/15/2021   Vitamin B12 deficiency (non anemic) 07/15/2021   Prediabetes 04/24/2019   Osteoarthritis of left knee 08/23/2017   Atrial fibrillation (HCC) 07/03/2017   Bipolar disorder (HCC) 02/28/2007   Gastroesophageal reflux disease 11/12/2006   Osteoarthritis 11/12/2006    Past Surgical History:  Procedure Laterality Date   ABDOMINAL HYSTERECTOMY     BREAST SURGERY     reduction   KNEE ARTHROSCOPY     TONSILLECTOMY      Current Outpatient Medications  Medication Sig Dispense Refill   albuterol (VENTOLIN HFA) 108 (90 Base) MCG/ACT inhaler Inhale 2 puffs into the lungs in the morning and at bedtime.     Atogepant (QULIPTA) 60 MG TABS Take 1 tablet (60 mg total) by mouth daily. 16 tablet 0   augmented betamethasone dipropionate (DIPROLENE-AF) 0.05 % cream Apply 1 application topically 2 (two) times daily as needed (itching).     baclofen (LIORESAL) 10 MG tablet Take 10 mg by mouth 2 (two) times daily.     betamethasone dipropionate 0.05 % cream      Buprenorphine HCl-Naloxone HCl 1.4-0.36 MG SUBL Place 1-2 tablets under the tongue as directed. Take 2 tablets in the morning and Take 1 tablet in afternoon     citalopram (CELEXA) 20 MG tablet TAKE 1 TABLET AT BEDTIME 90 tablet 3   diltiazem (CARDIZEM CD) 120 MG 24 hr capsule TAKE 1 CAPSULE EVERY DAY 90 capsule 3   diltiazem (CARDIZEM) 30 MG tablet Take 1  tablet every 4 hours AS NEEDED for  heart rate >90 30 tablet 1   estradiol (VIVELLE-DOT) 0.1 MG/24HR patch as needed.     ezetimibe (ZETIA) 10 MG tablet Take 10 mg by mouth daily.     Fremanezumab-vfrm (AJOVY) 225 MG/1.5ML SOAJ Inject 225 mg into the skin every 30 (thirty) days.     Fremanezumab-vfrm (AJOVY) 225 MG/1.5ML SOAJ Inject 225 mg into the skin every 30 (thirty) days. 1.5 mL 11   gabapentin (NEURONTIN) 100 MG capsule Take 1 capsule (100 mg total) by mouth at bedtime. 90 capsule 3   ketoconazole (NIZORAL) 2 % cream APPLY TOPICALLY 2 (TWO) TIMES DAILY TO RING ON LEG TWICE A DAY. 60 g 2   lamoTRIgine (LAMICTAL) 100 MG tablet TAKE 1 TABLET EVERY DAY 90 tablet 3   lansoprazole (PREVACID) 30 MG capsule Take 30 mg by mouth daily.     ondansetron (ZOFRAN) 4 MG tablet as needed.     ondansetron (ZOFRAN-ODT) 4 MG disintegrating tablet ondansetron 4 mg disintegrating tablet     risperiDONE (RISPERDAL) 1 MG tablet TAKE 1 TABLET AT BEDTIME 90 tablet 3   triamcinolone cream (KENALOG) 0.1 % daily as needed.     Ubrogepant (UBRELVY) 50 MG TABS Take 1 tablet (50 mg total) by mouth every 12 (twelve) hours. 30 tablet 2   Vitamin D, Ergocalciferol, (DRISDOL) 1.25 MG (50000 UNIT) CAPS capsule Take 1 capsule (50,000 Units total) by mouth every 7 (seven) days. 12 capsule 3   zonisamide (ZONEGRAN) 25 MG capsule Take 100 mg by mouth at bedtime.     No current facility-administered medications for this visit.    Allergies as of 03/24/2023 - Review Complete 03/24/2023  Allergen Reaction Noted   Augmentin [amoxicillin-pot clavulanate] Other (See Comments) 07/03/2017   Sumatriptan Other (See Comments) 04/24/2019   Doxycycline hyclate Hives and Rash 07/23/2009    Vitals: BP 125/62   Pulse 63   Ht 5\' 2"  (1.575 m)   Wt 174 lb 3.2 oz (79 kg)   BMI 31.86 kg/m  Last Weight:  Wt Readings from Last 1 Encounters:  03/24/23 174 lb 3.2 oz (79 kg)   Last Height:   Ht Readings from Last 1 Encounters:  03/24/23  5\' 2"  (1.575 m)     Physical exam: Exam: Gen: NAD, conversant, well nourised, obese, well groomed                     CV: RRR, no MRG. No Carotid Bruits. No peripheral edema, warm, nontender Eyes: Conjunctivae clear without exudates or hemorrhage  Neuro: Detailed Neurologic Exam  Speech:    Speech is normal; fluent and spontaneous with normal comprehension.  Cognition:    The patient is oriented to person, place, and time;     recent and remote memory intact;     language fluent;     normal attention, concentration,     fund of knowledge Cranial Nerves:    The pupils are equal, round, and reactive to light. Attempted fundoscopy could not visualize due to small pupils Visual fields are full to finger confrontation. Extraocular movements are intact. Trigeminal sensation is intact and the muscles of mastication are normal. The face is symmetric. The palate elevates in the midline. Hearing intact. Voice is normal. Shoulder shrug is normal. The tongue has normal motion without fasciculations.   Coordination:    Normal finger to nose and heel to shin.   Gait: nml  Motor Observation:    No asymmetry, no atrophy, and no involuntary movements noted. Tone:  Normal muscle tone.    Posture:    Posture is normal. normal erect    Strength:    Strength is V/V in the upper and lower limbs.      Sensation: intact to LT     Reflex Exam:  DTR's:    Deep tendon reflexes in the upper and lower extremities are symmetrical bilaterally.   Toes:    The toes are equiv bilaterally.   Clonus:    Clonus is absent.    Assessment/Plan:  Chronic intractable migraines  Pulse oximeter overnight - COPD, intractable headaches, refuses sleep eval, will get pulse ox overnight, discuss treatment with pcp Dry needling in the cervical muscles(neck): will refer to [facility]:  Physical Therapy: Cervical myofascial pain, forward posture contributing to migraines and cervicalgia. Please evaluate and  treat including dry needling, stretching, strengthening, manual therapy/massage, heating, TENS unit, exercising for scapular stabilization, pectoral stretching and rhomboid strengthening as clinically warranted as well as any other modality as recommended by evaluation.  Start Ajovy monthly injections: gave 3 months samples, please try to get patient assistance will discuss when I call her For the next 16 days take qulipta one daily: gave samples, to see if this will help with bridging qould gibe her ubrelvy/nurtec if I had samples but will try this Will call you next week to see how you are next week  Orders Placed This Encounter  Procedures   Ambulatory referral to Physical Therapy   Meds ordered this encounter  Medications   Fremanezumab-vfrm (AJOVY) 225 MG/1.5ML SOAJ    Sig: Inject 225 mg into the skin every 30 (thirty) days.    Patient has copay card; she can have medication regardless of insurance approval or copay amount.   Fremanezumab-vfrm (AJOVY) 225 MG/1.5ML SOAJ    Sig: Inject 225 mg into the skin every 30 (thirty) days.    Dispense:  1.5 mL    Refill:  11    Patient has copay card; she can have medication regardless of insurance approval or copay amount.   Atogepant (QULIPTA) 60 MG TABS    Sig: Take 1 tablet (60 mg total) by mouth daily.    Dispense:  16 tablet    Refill:  0    Cc: Santiago Glad, MD,  Early, Sung Amabile, NP  Naomie Dean, MD  [facility] [address] [place], [place] [address]  Phone [phone] Fax [phone]

## 2023-03-24 NOTE — Patient Instructions (Addendum)
Pulse oximeter overnight Dry needling in the cervical muscles(neck)  Start Ajovy monthly injections For the next 16 days take qulipta one daily Will call you next week to see how you are   Fremanezumab Injection What is this medication? FREMANEZUMAB (fre ma NEZ ue mab) prevents migraines. It works by blocking a substance in the body that causes migraines. It is a monoclonal antibody. This medicine may be used for other purposes; ask your health care provider or pharmacist if you have questions. COMMON BRAND NAME(S): AJOVY What should I tell my care team before I take this medication? They need to know if you have any of these conditions: An unusual or allergic reaction to fremanezumab, other medications, foods, dyes, or preservatives Pregnant or trying to get pregnant Breast-feeding How should I use this medication? This medication is injected under the skin. You will be taught how to prepare and give it. Take it as directed on the prescription label. Keep taking it unless your care team tells you to stop. It is important that you put your used needles and syringes in a special sharps container. Do not put them in a trash can. If you do not have a sharps container, call your pharmacist or care team to get one. Talk to your care team about the use of this medication in children. Special care may be needed. Overdosage: If you think you have taken too much of this medicine contact a poison control center or emergency room at once. NOTE: This medicine is only for you. Do not share this medicine with others. What if I miss a dose? If you miss a dose, take it as soon as you can. If it is almost time for your next dose, take only that dose. Do not take double or extra doses. What may interact with this medication? Interactions are not expected. This list may not describe all possible interactions. Give your health care provider a list of all the medicines, herbs, non-prescription drugs, or dietary  supplements you use. Also tell them if you smoke, drink alcohol, or use illegal drugs. Some items may interact with your medicine. What should I watch for while using this medication? Tell your care team if your symptoms do not start to get better or if they get worse. What side effects may I notice from receiving this medication? Side effects that you should report to your care team as soon as possible: Allergic reactions or angioedema--skin rash, itching or hives, swelling of the face, eyes, lips, tongue, arms, or legs, trouble swallowing or breathing Side effects that usually do not require medical attention (report to your care team if they continue or are bothersome): Pain, redness, or irritation at injection site This list may not describe all possible side effects. Call your doctor for medical advice about side effects. You may report side effects to FDA at 1-800-FDA-1088. Where should I keep my medication? Keep out of the reach of children and pets. Store in a refrigerator or at room temperature between 20 and 25 degrees C (68 and 77 degrees F). Refrigeration (preferred): Store in the refrigerator. Do not freeze. Keep in the original container until you are ready to take it. Remove the dose from the carton about 30 minutes before it is time for you to use it. If the dose is not used, it may be stored in the original container at room temperature for 7 days. Get rid of any unused medication after the expiration date. Room Temperature: This medication may be  stored at room temperature for up to 7 days. Keep it in the original container. Protect from light until time of use. If it is stored at room temperature, get rid of any unused medication after 7 days or after it expires, whichever is first. To get rid of medications that are no longer needed or have expired: Take the medication to a medication take-back program. Check with your pharmacy or law enforcement to find a location. If you cannot  return the medication, ask your pharmacist or care team how to get rid of this medication safely. NOTE: This sheet is a summary. It may not cover all possible information. If you have questions about this medicine, talk to your doctor, pharmacist, or health care provider.  2024 Elsevier/Gold Standard (2021-07-18 00:00:00)  Atogepant Tablets What is this medication? ATOGEPANT (a TOE je pant) prevents migraines. It works by blocking a substance in the body that causes migraines. This medicine may be used for other purposes; ask your health care provider or pharmacist if you have questions. COMMON BRAND NAME(S): QULIPTA What should I tell my care team before I take this medication? They need to know if you have any of these conditions: Kidney disease Liver disease An unusual or allergic reaction to atogepant, other medications, foods, dyes, or preservatives Pregnant or trying to get pregnant Breast-feeding How should I use this medication? Take this medication by mouth with water. Take it as directed on the prescription label at the same time every day. You can take it with or without food. If it upsets your stomach, take it with food. Keep taking it unless your care team tells you to stop. Talk to your care team about the use of this medication in children. Special care may be needed. Overdosage: If you think you have taken too much of this medicine contact a poison control center or emergency room at once. NOTE: This medicine is only for you. Do not share this medicine with others. What if I miss a dose? If you miss a dose, take it as soon as you can. If it is almost time for your next dose, take only that dose. Do not take double or extra doses. What may interact with this medication? Carbamazepine Certain medications for fungal infections, such as itraconazole, ketoconazole Clarithromycin Cyclosporine Efavirenz Etravirine Phenytoin Rifampin St. John's wort This list may not  describe all possible interactions. Give your health care provider a list of all the medicines, herbs, non-prescription drugs, or dietary supplements you use. Also tell them if you smoke, drink alcohol, or use illegal drugs. Some items may interact with your medicine. What should I watch for while using this medication? Visit your care team for regular checks on your progress. Tell your care team if your symptoms do not start to get better or if they get worse. What side effects may I notice from receiving this medication? Side effects that you should report to your care team as soon as possible: Allergic reactions--skin rash, itching, hives, swelling of the face, lips, tongue, or throat Side effects that usually do not require medical attention (report to your care team if they continue or are bothersome): Constipation Fatigue Loss of appetite with weight loss Nausea This list may not describe all possible side effects. Call your doctor for medical advice about side effects. You may report side effects to FDA at 1-800-FDA-1088. Where should I keep my medication? Keep out of the reach of children and pets. Store at room temperature between 20 and  25 degrees C (68 and 77 degrees F). Get rid of any unused medication after the expiration date. To get rid of medications that are no longer needed or have expired: Take the medication to a medication take-back program. Check with your pharmacy or law enforcement to find a location. If you cannot return the medication, check the label or package insert to see if the medication should be thrown out in the garbage or flushed down the toilet. If you are not sure, ask your care team. If it is safe to put it in the trash, take the medication out of the container. Mix the medication with cat litter, dirt, coffee grounds, or other unwanted substance. Seal the mixture in a bag or container. Put it in the trash. NOTE: This sheet is a summary. It may not cover all  possible information. If you have questions about this medicine, talk to your doctor, pharmacist, or health care provider.  2024 Elsevier/Gold Standard (2021-07-14 00:00:00)

## 2023-03-28 ENCOUNTER — Encounter: Payer: Self-pay | Admitting: Neurology

## 2023-03-28 MED ORDER — QULIPTA 60 MG PO TABS: 60.0000 mg | ORAL_TABLET | Freq: Every day | ORAL | 0 refills | Status: DC

## 2023-03-28 MED ORDER — AJOVY 225 MG/1.5ML ~~LOC~~ SOAJ: 225.0000 mg | SUBCUTANEOUS | 11 refills | Status: DC

## 2023-03-29 ENCOUNTER — Telehealth: Payer: Self-pay | Admitting: *Deleted

## 2023-03-29 ENCOUNTER — Ambulatory Visit: Payer: Medicare HMO | Admitting: Podiatry

## 2023-03-29 ENCOUNTER — Encounter: Payer: Self-pay | Admitting: Neurology

## 2023-03-29 DIAGNOSIS — J449 Chronic obstructive pulmonary disease, unspecified: Secondary | ICD-10-CM

## 2023-03-29 NOTE — Telephone Encounter (Signed)
-----   Message from Anson Fret sent at 03/28/2023  4:11 PM EDT ----- Regarding: Plese order overnight pulse ox Plese order overnight pulse ox for copd

## 2023-03-29 NOTE — Telephone Encounter (Signed)
Order for ONO sent to [facility].  

## 2023-03-29 NOTE — Telephone Encounter (Signed)
From Dr Trevor Mace last office note:  "Start Ajovy monthly injections: gave 3 months samples, please try to get patient assistance will discuss when I call her For the next 16 days take qulipta one daily: gave samples, to see if this will help with bridging qould gibe her ubrelvy/nurtec if I had samples but will try this"

## 2023-03-29 NOTE — Telephone Encounter (Signed)
Receipt of order confirmed by [facility].

## 2023-04-03 ENCOUNTER — Other Ambulatory Visit: Payer: Self-pay | Admitting: Cardiology

## 2023-04-15 ENCOUNTER — Ambulatory Visit (INDEPENDENT_AMBULATORY_CARE_PROVIDER_SITE_OTHER): Payer: Medicare HMO

## 2023-04-15 ENCOUNTER — Ambulatory Visit: Payer: Medicare HMO | Admitting: Podiatry

## 2023-04-15 DIAGNOSIS — M7989 Other specified soft tissue disorders: Secondary | ICD-10-CM | POA: Diagnosis not present

## 2023-04-15 DIAGNOSIS — M778 Other enthesopathies, not elsewhere classified: Secondary | ICD-10-CM | POA: Diagnosis not present

## 2023-04-15 DIAGNOSIS — L603 Nail dystrophy: Secondary | ICD-10-CM

## 2023-04-15 MED ORDER — METHYLPREDNISOLONE 4 MG PO TBPK: ORAL_TABLET | ORAL | 0 refills | Status: DC

## 2023-04-15 NOTE — Progress Notes (Signed)
Subjective:   Patient ID: Amanda [name], female   DOB: 67 y.o.   MRN: [id number]   HPI Chief Complaint  Patient presents with   Plantar Fasciitis    Right foot pain plantar fasciitis and left big toe nail problem toe pain   67 year old female presents the office today with the above concerns.  She said that she has some swelling yesterday.  She does not report any history of gout.  No injuries that she reports.  For years she may have ongoing plantar fasciitis will go away for some time to come back.  She states she denies any recent treatment.  This has been more to the lateral aspect of the foot where she had her discomfort.  Her left toenail is thickened discolored and she could not start to come off.  No pain to this.  No swelling redness or drainage.  No recent treatment.  No other concerns   Review of Systems  All other systems reviewed and are negative.  Past Medical History:  Diagnosis Date   Atrial fibrillation [facility])    DEGENERATIVE JOINT DISEASE 11/12/2006   DISORDER, BIPOLAR NOS 02/28/2007   GERD 11/12/2006   Major depressive disorder, single episode, unspecified 07/15/2021    Past Surgical History:  Procedure Laterality Date   ABDOMINAL HYSTERECTOMY     BREAST SURGERY     reduction   KNEE ARTHROSCOPY     TONSILLECTOMY       Current Outpatient Medications:    albuterol (VENTOLIN HFA) 108 (90 Base) MCG/ACT inhaler, Inhale 2 puffs into the lungs in the morning and at bedtime., Disp: , Rfl:    Atogepant (QULIPTA) 60 MG TABS, Take 1 tablet (60 mg total) by mouth daily., Disp: 16 tablet, Rfl: 0   augmented betamethasone dipropionate (DIPROLENE-AF) 0.05 % cream, Apply 1 application topically 2 (two) times daily as needed (itching)., Disp: , Rfl:    baclofen (LIORESAL) 10 MG tablet, Take 10 mg by mouth 2 (two) times daily., Disp: , Rfl:    betamethasone dipropionate 0.05 % cream, , Disp: , Rfl:    Buprenorphine HCl-Naloxone HCl 1.4-0.36 MG SUBL, Place 1-2 tablets  under the tongue as directed. Take 2 tablets in the morning and Take 1 tablet in afternoon, Disp: , Rfl:    citalopram (CELEXA) 20 MG tablet, TAKE 1 TABLET AT BEDTIME, Disp: 90 tablet, Rfl: 3   diltiazem (CARDIZEM CD) 120 MG 24 hr capsule, TAKE 1 CAPSULE EVERY DAY (NEED MD APPOINTMENT FOR REFILLS), Disp: 90 capsule, Rfl: 3   diltiazem (CARDIZEM) 30 MG tablet, Take 1 tablet every 4 hours AS NEEDED for heart rate >90, Disp: 30 tablet, Rfl: 1   estradiol (VIVELLE-DOT) 0.1 MG/24HR patch, as needed., Disp: , Rfl:    ezetimibe (ZETIA) 10 MG tablet, Take 10 mg by mouth daily., Disp: , Rfl:    Fremanezumab-vfrm (AJOVY) 225 MG/1.5ML SOAJ, Inject 225 mg into the skin every 30 (thirty) days., Disp: , Rfl:    Fremanezumab-vfrm (AJOVY) 225 MG/1.5ML SOAJ, Inject 225 mg into the skin every 30 (thirty) days., Disp: 1.5 mL, Rfl: 11   gabapentin (NEURONTIN) 100 MG capsule, Take 1 capsule (100 mg total) by mouth at bedtime., Disp: 90 capsule, Rfl: 3   ketoconazole (NIZORAL) 2 % cream, APPLY TOPICALLY 2 (TWO) TIMES DAILY TO RING ON LEG TWICE A DAY., Disp: 60 g, Rfl: 2   lamoTRIgine (LAMICTAL) 100 MG tablet, TAKE 1 TABLET EVERY DAY, Disp: 90 tablet, Rfl: 3   lansoprazole (PREVACID) 30 MG  capsule, Take 30 mg by mouth daily., Disp: , Rfl:    methylPREDNISolone (MEDROL DOSEPAK) 4 MG TBPK tablet, Take as directed, Disp: 21 tablet, Rfl: 0   ondansetron (ZOFRAN) 4 MG tablet, as needed., Disp: , Rfl:    ondansetron (ZOFRAN-ODT) 4 MG disintegrating tablet, ondansetron 4 mg disintegrating tablet, Disp: , Rfl:    risperiDONE (RISPERDAL) 1 MG tablet, TAKE 1 TABLET AT BEDTIME, Disp: 90 tablet, Rfl: 3   triamcinolone cream (KENALOG) 0.1 %, daily as needed., Disp: , Rfl:    Ubrogepant (UBRELVY) 50 MG TABS, Take 1 tablet (50 mg total) by mouth every 12 (twelve) hours., Disp: 30 tablet, Rfl: 2   Vitamin D, Ergocalciferol, (DRISDOL) 1.25 MG (50000 UNIT) CAPS capsule, Take 1 capsule (50,000 Units total) by mouth every 7 (seven) days.,  Disp: 12 capsule, Rfl: 3   zonisamide (ZONEGRAN) 25 MG capsule, Take 100 mg by mouth at bedtime., Disp: , Rfl:   Allergies  Allergen Reactions   Augmentin [Amoxicillin-Pot Clavulanate] Other (See Comments)    Has patient had a PCN reaction causing immediate rash, facial/tongue/throat swelling, SOB or lightheadedness with hypotension: No Has patient had a PCN reaction causing severe rash involving mucus membranes or skin necrosis: No Has patient had a PCN reaction that required hospitalization: No Has patient had a PCN reaction occurring within the last 10 years: No If all of the above answers are "NO", then may proceed with Cephalosporin use.    Sumatriptan Other (See Comments)   Doxycycline Hyclate Hives and Rash    REACTION: rash / hives          Objective:  Physical Exam  General: AAO x3, NAD  Dermatological: Left hallux nail is mildly hypertrophic, dystrophic with yellow, brown discoloration and subungual debris is present.  There is no edema, erythema or signs of infection.  No open lesions.  Vascular: Dorsalis Pedis artery and Posterior Tibial artery pedal pulses are 2/4 bilateral with immedate capillary fill time. Pedal hair growth present. No varicosities and no lower extremity edema present bilateral. There is no pain with calf compression, swelling, warmth, erythema.   Neruologic: Grossly intact via light touch bilateral.  Negative Tinel sign.  Musculoskeletal: Palpation mostly on the lateral aspect of the right foot mostly on the calcaneocuboid joint area, peroneal tendon.  There is no significant pain on the anterior lateral ankle complex.  There is no area pinpoint tenderness.  There is no pain on the insertion of the course of the plantar fascia or the Achilles tendon.  Muscular strength 5/5 in all groups tested bilateral.  Gait: Unassisted, Nonantalgic.       Assessment:   67 year old female with capsulitis right foot, swelling; onychomycosis     Plan:   -Treatment options discussed including all alternatives, risks, and complications -Etiology of symptoms were discussed -X-rays were obtained and reviewed with the patient.  Multiple views right foot were obtained.  There is arthritic changes present of the midfoot.  No evidence of acute fracture. -Offered cam boot.  She would have to hold off on this.  Prescribed a Medrol Dosepak to help with swelling, discomfort.  Icing daily.  Discussed supportive shoes, good support. -I sharply debrided left hallux nail with any complications after this for culture, pathology. We discussed treatment options for nail fungus with oral, topical treatment as well as alternative drain including nail removal.    Vivi Barrack DPM

## 2023-04-20 ENCOUNTER — Ambulatory Visit: Payer: Medicare HMO | Admitting: Podiatry

## 2023-04-21 ENCOUNTER — Other Ambulatory Visit: Payer: Self-pay | Admitting: Podiatry

## 2023-04-21 ENCOUNTER — Other Ambulatory Visit: Payer: Self-pay

## 2023-04-21 ENCOUNTER — Ambulatory Visit: Payer: Medicare HMO | Attending: Neurology

## 2023-04-21 DIAGNOSIS — G8929 Other chronic pain: Secondary | ICD-10-CM | POA: Diagnosis present

## 2023-04-21 DIAGNOSIS — M6281 Muscle weakness (generalized): Secondary | ICD-10-CM | POA: Diagnosis not present

## 2023-04-21 DIAGNOSIS — R519 Headache, unspecified: Secondary | ICD-10-CM

## 2023-04-21 DIAGNOSIS — M542 Cervicalgia: Secondary | ICD-10-CM | POA: Diagnosis not present

## 2023-04-21 DIAGNOSIS — G43711 Chronic migraine without aura, intractable, with status migrainosus: Secondary | ICD-10-CM | POA: Insufficient documentation

## 2023-04-21 DIAGNOSIS — R252 Cramp and spasm: Secondary | ICD-10-CM

## 2023-04-21 NOTE — Therapy (Signed)
OUTPATIENT PHYSICAL THERAPY CERVICAL EVALUATION   Patient Name: Amanda [name] MRN: [id number] DOB:[birth year], 67 y.o., female Today's Date: 04/21/2023  END OF SESSION:  PT End of Session - 04/21/23 1240     Visit Number 1    Date for PT Re-Evaluation 06/16/23    Authorization Type [employer] MEDICARE HMO    PT Start Time 1230    PT Stop Time 1315    PT Time Calculation (min) 45 min    Activity Tolerance Patient tolerated treatment well    Behavior During Therapy [facility] for tasks assessed/performed             Past Medical History:  Diagnosis Date   Atrial fibrillation (HCC)    DEGENERATIVE JOINT DISEASE 11/12/2006   DISORDER, BIPOLAR NOS 02/28/2007   GERD 11/12/2006   Major depressive disorder, single episode, unspecified 07/15/2021   Past Surgical History:  Procedure Laterality Date   ABDOMINAL HYSTERECTOMY     BREAST SURGERY     reduction   KNEE ARTHROSCOPY     TONSILLECTOMY     Patient Active Problem List   Diagnosis Date Noted   Chronic migraine without aura, with intractable migraine, so stated, with status migrainosus 03/24/2023   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   Easy bruising 01/17/2023   Anxiety 11/26/2022   Annular dermatitis 06/07/2022   Pruritic disorder 07/17/2021   Numerous skin moles 07/17/2021   Asthma 07/15/2021   Back pain 07/15/2021   Carpal tunnel syndrome 07/15/2021   Chronic pain 07/15/2021   Hyperlipidemia, unspecified 07/15/2021   Hypertension 07/15/2021   Hypothyroid 07/15/2021   Nicotine dependence 07/15/2021   Other long term (current) drug therapy 07/15/2021   Vitamin B12 deficiency (non anemic) 07/15/2021   Prediabetes 04/24/2019   Osteoarthritis of left knee 08/23/2017   Atrial fibrillation (HCC) 07/03/2017   Bipolar disorder (HCC) 02/28/2007   Gastroesophageal reflux disease 11/12/2006   Osteoarthritis 11/12/2006    PCP: Stacey Drain   REFERRING PROVIDER: Anson Fret, MD   REFERRING DIAG:   [id number] (ICD-10-CM) - Chronic migraine without aura, with intractable migraine, so stated, with status migrainosus  M79.18 (ICD-10-CM) - Cervical myofascial pain syndrome  THERAPY DIAG:  Cervicalgia  Chronic intractable headache, unspecified headache type  Cramp and spasm  Muscle weakness (generalized)  Rationale for Evaluation and Treatment: Rehabilitation  ONSET DATE: 03/28/2023  SUBJECTIVE:  SUBJECTIVE STATEMENT: Patient reports long history of chronic migraines and neck pain.  She has been through multiple treatment approaches for her headaches.  She states that the last suggestion was to do cranial injections.  She admits she carries her tension in her shoulders.  She denies any hx of shoulder injuries.  She enjoys being active.  She hopes to reduce her headache frequency and severity.  She would like to try DN.   Hand dominance: Right  PERTINENT HISTORY:  Chronic migraines with hx of heavy Nsaid use.   PAIN:  Are you having pain? Yes: NPRS scale: 3/10 Pain location: head and neck Pain description: pounding  Aggravating factors: stress Relieving factors: Was taking heavy amounts of nsaids and tylenol but MD has restricted her from taking   PRECAUTIONS: None  RED FLAGS: None     WEIGHT BEARING RESTRICTIONS: No  FALLS:  Has patient fallen in last 6 months? No  LIVING ENVIRONMENT: Lives with: lives with their spouse Lives in: House/apartment  OCCUPATION: not working  PLOF: Independent, Independent with basic ADLs, Independent with household mobility without device, Independent with community mobility without device, Independent with homemaking with ambulation, Independent with gait, and Independent with transfers  PATIENT GOALS: She hopes to reduce her headache  frequency and severity.  She would like to try DN.    NEXT MD VISIT: unknown  OBJECTIVE:  Note: Objective measures were completed at Evaluation unless otherwise noted.  DIAGNOSTIC FINDINGS:  MRI HEAD WITHOUT CONTRASTMPRESSION: No acute intracranial abnormality and normal for age noncontrast MRI appearance of the Brain  PATIENT SURVEYS:  FOTO 55, goal 19  COGNITION: Overall cognitive status: Within functional limits for tasks assessed  SENSATION: WFL  POSTURE: rounded shoulders and decreased lumbar lordosis  PALPATION: Extreme fascial restriction and muscle tension bilateral UEs   CERVICAL ROM:   Active ROM A/PROM (deg) eval  Flexion WNL  Extension WNL  Right lateral flexion 25  Left lateral flexion 22  Right rotation 40  Left rotation 40   (Blank rows = not tested)  UPPER EXTREMITY ROM:  WFL  UPPER EXTREMITY MMT:  All generally 4+ to 5/5 but likely weak postural musculature.    CERVICAL SPECIAL TESTS:  none  FUNCTIONAL TESTS:  5 times sit to stand: 14.74 sec Timed up and go (TUG): 9.13 sec  TODAY'S TREATMENT:                                                                                                                              DATE: 04/21/23 Initial evaluation completed and initiated HEP   PATIENT EDUCATION:  Education details: Educated on proper posture and relaxing her shoulders away from her ears Person educated: Patient Education method: Programmer, multimedia, Facilities manager, and Verbal cues Education comprehension: verbalized understanding, returned demonstration, and verbal cues required  HOME EXERCISE PROGRAM: Unable to begin HEP due to time constraints, but did encourage patient to work on relaxing her shoulders away from her ears and  how her elevated energy disposition likely causes her to hold a lot of tension in her postural muscles.    ASSESSMENT:  CLINICAL IMPRESSION: Patient is a 67 y.o. female who was seen today for physical therapy  evaluation and treatment for chronic headaches and neck pain.  She presents with decreased C spine ROM, postural weakness, extreme soft tissue restriction and muscle tension along with elevated pain.  CT scan shows no specific brain abnormality.  She is admittedly bi-polar with hx of adhd and hx of heavy use of Nsaids and multiple medication trials for her headaches.  She would benefit from skilled PT for C spine ROM, postural strengthening, STM, DN and modalities for pain relief.    OBJECTIVE IMPAIRMENTS: decreased ROM, decreased strength, increased fascial restrictions, increased muscle spasms, impaired flexibility, impaired UE functional use, postural dysfunction, and pain.   ACTIVITY LIMITATIONS: carrying, lifting, sleeping, transfers, bathing, dressing, reach over head, and hygiene/grooming  PARTICIPATION LIMITATIONS: meal prep, cleaning, laundry, driving, shopping, community activity, and yard work  PERSONAL FACTORS: Behavior pattern, Fitness, and 1-2 comorbidities: anxiety, bipolar disorder  are also affecting patient's functional outcome.   REHAB POTENTIAL: Fair due to chronic intensity of patients headaches  CLINICAL DECISION MAKING: Evolving/moderate complexity  EVALUATION COMPLEXITY: Moderate   GOALS: Goals reviewed with patient? Yes  SHORT TERM GOALS: Target date: 05/19/2023   Pain report to be no greater than 4/10  Baseline:  Goal status: INITIAL  2.  Headache frequency to be reduced by 25% Baseline:  Goal status: INITIAL  3.  Patient to be able to tolerate dry needling Baseline:  Goal status: INITIAL   LONG TERM GOALS: Target date: 06/16/2023  Patient to report pain no greater than 2/10  Baseline:  Goal status: INITIAL  2.  Reduce frequency of headaches to 1 time per month Baseline:  Goal status: INITIAL  3.  Patient to be independent with advanced HEP  Baseline:  Goal status: INITIAL  4.  Patient to be able to sleep through the night  Baseline:  Goal  status: INITIAL  5.  Patient to report 85% improvement in overall symptoms  Baseline:  Goal status: INITIAL  6.  FOTO to be 63 Baseline:  Goal status: INITIAL   PLAN:  PT FREQUENCY: 1-2x/week  PT DURATION: 8 weeks  PLANNED INTERVENTIONS: 97110-Therapeutic exercises, 97530- Therapeutic activity, [id number]- Neuromuscular re-education, 97535- Self Care, 16109- Manual therapy, [id number]- Aquatic Therapy, 97014- Electrical stimulation (unattended), [id number]- Electrical stimulation (manual), [id number]- Ultrasound, [id number]- Traction (mechanical), [id number]- Ionotophoresis 4mg /ml Dexamethasone, Patient/Family education, Taping, Dry Needling, Joint mobilization, Spinal mobilization, Cryotherapy, and Moist heat  PLAN FOR NEXT SESSION: UBE, tband postural strengthening, DN   Chemere Steffler B. Stepfon Rawles, PT 04/21/23 9:11 PM [facility] [address] [place], [place] [address] Phone # [phone] Fax [phone]

## 2023-04-22 ENCOUNTER — Encounter: Payer: Self-pay | Admitting: Podiatry

## 2023-04-22 ENCOUNTER — Other Ambulatory Visit: Payer: Self-pay | Admitting: Podiatry

## 2023-04-22 MED ORDER — CICLOPIROX 8 % EX SOLN: Freq: Every day | CUTANEOUS | 2 refills | Status: DC

## 2023-04-23 ENCOUNTER — Telehealth: Payer: Self-pay | Admitting: Podiatry

## 2023-04-23 ENCOUNTER — Other Ambulatory Visit: Payer: Self-pay | Admitting: Podiatry

## 2023-04-23 DIAGNOSIS — M778 Other enthesopathies, not elsewhere classified: Secondary | ICD-10-CM

## 2023-04-23 NOTE — Telephone Encounter (Signed)
Pt called stating she left message yesterday and has not heard back but is wanting to know what is actually wrong with her feet as she is having a lot of pain. She said you mentioned the tendon but doesn't remember exactly.

## 2023-05-05 ENCOUNTER — Encounter (HOSPITAL_COMMUNITY): Payer: Self-pay | Admitting: Internal Medicine

## 2023-05-05 ENCOUNTER — Ambulatory Visit (HOSPITAL_COMMUNITY)
Admission: RE | Admit: 2023-05-05 | Discharge: 2023-05-05 | Disposition: A | Discharge status: Home / Self Care | Payer: Medicare HMO | Source: Ambulatory Visit | Attending: Internal Medicine | Admitting: Internal Medicine

## 2023-05-05 VITALS — BP 140/80 | HR 90 | Ht 62.0 in | Wt 169.8 lb

## 2023-05-05 DIAGNOSIS — F319 Bipolar disorder, unspecified: Secondary | ICD-10-CM | POA: Diagnosis not present

## 2023-05-05 DIAGNOSIS — Z79899 Other long term (current) drug therapy: Secondary | ICD-10-CM | POA: Diagnosis not present

## 2023-05-05 DIAGNOSIS — F17210 Nicotine dependence, cigarettes, uncomplicated: Secondary | ICD-10-CM | POA: Insufficient documentation

## 2023-05-05 DIAGNOSIS — Z7901 Long term (current) use of anticoagulants: Secondary | ICD-10-CM | POA: Insufficient documentation

## 2023-05-05 DIAGNOSIS — Z716 Tobacco abuse counseling: Secondary | ICD-10-CM | POA: Insufficient documentation

## 2023-05-05 DIAGNOSIS — I480 Paroxysmal atrial fibrillation: Secondary | ICD-10-CM | POA: Insufficient documentation

## 2023-05-05 DIAGNOSIS — I4892 Unspecified atrial flutter: Secondary | ICD-10-CM | POA: Insufficient documentation

## 2023-05-05 MED ORDER — APIXABAN 5 MG PO TABS: 5.0000 mg | ORAL_TABLET | Freq: Two times a day (BID) | ORAL | 3 refills | Status: DC

## 2023-05-05 NOTE — Progress Notes (Signed)
Primary Care Physician: Tollie Eth, NP Referring Physician: Dr. Dietrich Pates Cardiologist: Dr. Samara Snide Amanda [name] is a 67 y.o. female with a h/o  paroxysmal afib that was seen in the ER yesterday with controlled v rates. ER staff opted not to cardiovert her. They spoke to Dr. Tenny Craw and she recommended f/u in the afib clinic today.  Pt was d/c in rate controlled afib but fortunately has returned to SR today. Pt is unaware of when she went back to SR.  She reports that she snores, apnea noted and sleeps in a recliner because of this.  No previous sleep study. She does smoke a pack a day for years, minimal caffeine use and denies alcohol use. She currently takes 120 mg diltiazem daily. She is not on anticoagulation for a CHA2DS2VASc  score of 1-2. Pt denies HTN as a medical problem.    F/u in afib clinic 10/29/21. She is in SR and not noted any afib. She decided she does not want to go thru sleep study at this time. Due to interaction with her meds with most  antiarrythmic's, if her afib burden increases,  she will most likely be an ablation candidate.   F/u in Afib clinic, 01/18/23. She called office noting to be in Afib since 7/22. She was started on Eliquis 5 mg BID on 7/23 due to being in Afib > 24 hours. She is here today in normal rhythm. She felt she converted to normal rhythm the day of or after her phone call to clinic. She had been taking prn diltiazem in addition to her diltiazem 120 mg daily. She feels palpitations as primary symptom historically. She has been taking diltiazem PRN. No missed doses of Eliquis. She does not drink alcohol. She drinks 1 cup of coffee daily.   F/u in Afib clinic, 05/05/23. She is currently in atrial flutter. Patient notes she has been going in and out of rhythm with an episode last Monday-Tuesday and current episode beginning 2 nights ago. She is not on anticoagulation currently due to low Chads score.   Today, she denies symptoms of palpitations,  chest pain, shortness of breath, orthopnea, PND, lower extremity edema, dizziness, presyncope, syncope, or neurologic sequela. The patient is tolerating medications without difficulties and is otherwise without complaint today.   Past Medical History:  Diagnosis Date   Atrial fibrillation [facility])    DEGENERATIVE JOINT DISEASE 11/12/2006   DISORDER, BIPOLAR NOS 02/28/2007   GERD 11/12/2006   Major depressive disorder, single episode, unspecified 07/15/2021   Past Surgical History:  Procedure Laterality Date   ABDOMINAL HYSTERECTOMY     BREAST SURGERY     reduction   KNEE ARTHROSCOPY     TONSILLECTOMY      Current Outpatient Medications  Medication Sig Dispense Refill   albuterol (VENTOLIN HFA) 108 (90 Base) MCG/ACT inhaler Inhale 2 puffs into the lungs in the morning and at bedtime.     apixaban (ELIQUIS) 5 MG TABS tablet Take 1 tablet (5 mg total) by mouth 2 (two) times daily. 60 tablet 3   Atogepant (QULIPTA) 60 MG TABS Take 1 tablet (60 mg total) by mouth daily. 16 tablet 0   augmented betamethasone dipropionate (DIPROLENE-AF) 0.05 % cream Apply 1 application topically 2 (two) times daily as needed (itching).     baclofen (LIORESAL) 10 MG tablet Take 10 mg by mouth 2 (two) times daily.     betamethasone dipropionate 0.05 % cream      Buprenorphine  HCl-Naloxone HCl 1.4-0.36 MG SUBL Place 1-2 tablets under the tongue as directed. Take 2 tablets in the morning and Take 1 tablet in afternoon     ciclopirox (PENLAC) 8 % solution Apply topically at bedtime. Apply over nail and surrounding skin. Apply daily over previous coat. After seven (7) days, may remove with alcohol and continue cycle. 6.6 mL 2   citalopram (CELEXA) 20 MG tablet TAKE 1 TABLET AT BEDTIME 90 tablet 3   diltiazem (CARDIZEM CD) 120 MG 24 hr capsule TAKE 1 CAPSULE EVERY DAY (NEED MD APPOINTMENT FOR REFILLS) 90 capsule 3   diltiazem (CARDIZEM) 30 MG tablet Take 1 tablet every 4 hours AS NEEDED for heart rate >90 30 tablet 1    estradiol (VIVELLE-DOT) 0.1 MG/24HR patch as needed.     ezetimibe (ZETIA) 10 MG tablet Take 10 mg by mouth daily.     Fremanezumab-vfrm (AJOVY) 225 MG/1.5ML SOAJ Inject 225 mg into the skin every 30 (thirty) days.     Fremanezumab-vfrm (AJOVY) 225 MG/1.5ML SOAJ Inject 225 mg into the skin every 30 (thirty) days. 1.5 mL 11   gabapentin (NEURONTIN) 100 MG capsule Take 1 capsule (100 mg total) by mouth at bedtime. 90 capsule 3   ketoconazole (NIZORAL) 2 % cream APPLY TOPICALLY 2 (TWO) TIMES DAILY TO RING ON LEG TWICE A DAY. 60 g 2   lamoTRIgine (LAMICTAL) 100 MG tablet TAKE 1 TABLET EVERY DAY 90 tablet 3   lansoprazole (PREVACID) 30 MG capsule Take 30 mg by mouth daily.     methylPREDNISolone (MEDROL DOSEPAK) 4 MG TBPK tablet Take as directed 21 tablet 0   ondansetron (ZOFRAN) 4 MG tablet as needed.     ondansetron (ZOFRAN-ODT) 4 MG disintegrating tablet ondansetron 4 mg disintegrating tablet     risperiDONE (RISPERDAL) 1 MG tablet TAKE 1 TABLET AT BEDTIME 90 tablet 3   triamcinolone cream (KENALOG) 0.1 % daily as needed.     Ubrogepant (UBRELVY) 50 MG TABS Take 1 tablet (50 mg total) by mouth every 12 (twelve) hours. 30 tablet 2   Vitamin D, Ergocalciferol, (DRISDOL) 1.25 MG (50000 UNIT) CAPS capsule Take 1 capsule (50,000 Units total) by mouth every 7 (seven) days. 12 capsule 3   zonisamide (ZONEGRAN) 25 MG capsule Take 100 mg by mouth at bedtime.     No current facility-administered medications for this encounter.    Allergies  Allergen Reactions   Augmentin [Amoxicillin-Pot Clavulanate] Other (See Comments)    Has patient had a PCN reaction causing immediate rash, facial/tongue/throat swelling, SOB or lightheadedness with hypotension: No Has patient had a PCN reaction causing severe rash involving mucus membranes or skin necrosis: No Has patient had a PCN reaction that required hospitalization: No Has patient had a PCN reaction occurring within the last 10 years: No If all of the above  answers are "NO", then may proceed with Cephalosporin use.    Sumatriptan Other (See Comments)   Doxycycline Hyclate Hives and Rash    REACTION: rash / hives   ROS- All systems are reviewed and negative except as per the HPI above  Physical Exam: Vitals:   05/05/23 1330  BP: (!) 140/80  Pulse: 90  Weight: 77 kg  Height: 5\' 2"  (1.575 m)     Wt Readings from Last 3 Encounters:  05/05/23 77 kg  03/24/23 79 kg  01/18/23 77.9 kg    Labs: Lab Results  Component Value Date   NA 145 (H) 12/07/2022   K 4.7 12/07/2022   CL  104 12/07/2022   CO2 27 12/07/2022   GLUCOSE 102 (H) 12/07/2022   BUN 16 12/07/2022   CREATININE 0.83 12/07/2022   CALCIUM 9.4 12/07/2022   MG 2.1 07/03/2017   No results found for: "INR" Lab Results  Component Value Date   CHOL 206 (H) 07/15/2021   HDL 43 07/15/2021   LDLCALC 140 (H) 07/15/2021   TRIG 126 07/15/2021   GEN- The patient is well appearing, alert and oriented x 3 today.   Neck - no JVD or carotid bruit noted Lungs- Clear to ausculation bilaterally, normal work of breathing Heart- Irregular rate and rhythm, no murmurs, rubs or gallops, PMI not laterally displaced Extremities- no clubbing, cyanosis, or edema Skin - no rash or ecchymosis noted   EKG- Vent. rate 90 BPM PR interval * ms QRS duration 76 ms QT/QTcB 360/440 ms P-R-T axes 81 26 38 Atrial flutter with variable A-V block Cannot rule out Anterior infarct , age undetermined Abnormal ECG When compared with ECG of 18-Jan-2023 14:29, PREVIOUS ECG IS PRESENT  Echo- 6 2022-1. Technically difficlut echo with poor image quality.   2. Left ventricular ejection fraction, by estimation, is 60 to 65%. The  left ventricle has normal function. The left ventricle has no regional  wall motion abnormalities. Left ventricular diastolic parameters were  normal.   3. Right ventricular systolic function is normal. The right ventricular  size is normal.   4. The mitral valve was not well  visualized. No evidence of mitral valve  regurgitation.   5. The aortic valve was not well visualized. Aortic valve regurgitation  is not visualized. No aortic stenosis is present.   Assessment and Plan:  1. Paroxysmal afib / flutter   She is currently in atrial flutter. We discussed rhythm control options which are limited by her medication regimen (she is unwilling to change because she notes they have helped her greatly with history of bipolar disorder). After discussion, will help patient establish with EP to discuss ablation. We also discussed smoking cessation.   Previous discussion on AAD treatment options:  Here are the thoughts of Margaretmary Dys, PharmD - "[name] on a lot of QTc prolonging meds. Flecainide will interact with celexa and risperidone, multaq is contraindicated with celexa and also interacts with risperidone. Celexa is one of the most qtc prolonging SSRIs, not sure if that could be changed to something else by PCP? then either option might be ok but would still need close EKG monitoring bc of the interaction with risperidone"  2. CHA2DS2VASc  score of 1-2 ( pt denies BP dx)  Restart Eliquis 5 mg BID.    Refer to EP to discuss ablation.    [place] Bells, PA-C Af[facility] [address] [place], [place] [address] [phone]

## 2023-05-05 NOTE — Patient Instructions (Signed)
Pulse Field Ablation

## 2023-05-10 ENCOUNTER — Encounter: Payer: Self-pay | Admitting: Podiatry

## 2023-05-11 ENCOUNTER — Ambulatory Visit: Payer: Medicare HMO | Attending: Neurology

## 2023-05-11 DIAGNOSIS — M542 Cervicalgia: Secondary | ICD-10-CM | POA: Diagnosis present

## 2023-05-11 DIAGNOSIS — G8929 Other chronic pain: Secondary | ICD-10-CM

## 2023-05-11 DIAGNOSIS — R519 Headache, unspecified: Secondary | ICD-10-CM | POA: Insufficient documentation

## 2023-05-11 DIAGNOSIS — R252 Cramp and spasm: Secondary | ICD-10-CM | POA: Diagnosis present

## 2023-05-11 DIAGNOSIS — M6281 Muscle weakness (generalized): Secondary | ICD-10-CM | POA: Diagnosis present

## 2023-05-11 DIAGNOSIS — R293 Abnormal posture: Secondary | ICD-10-CM | POA: Diagnosis present

## 2023-05-11 NOTE — Therapy (Signed)
OUTPATIENT PHYSICAL THERAPY CERVICAL TREATMENT    Patient Name: Amanda [name] MRN: [id number] DOB:1955/11/23, 67 y.o., female Today's Date: 05/11/2023  END OF SESSION:  PT End of Session - 05/11/23 1146     Visit Number 2    Date for PT Re-Evaluation 06/16/23    Authorization Type [employer] MEDICARE HMO    PT Start Time 1145    PT Stop Time 1218    PT Time Calculation (min) 33 min    Activity Tolerance Patient limited by fatigue    Behavior During Therapy [facility] for tasks assessed/performed             Past Medical History:  Diagnosis Date   Atrial fibrillation (HCC)    DEGENERATIVE JOINT DISEASE 11/12/2006   DISORDER, BIPOLAR NOS 02/28/2007   GERD 11/12/2006   Major depressive disorder, single episode, unspecified 07/15/2021   Past Surgical History:  Procedure Laterality Date   ABDOMINAL HYSTERECTOMY     BREAST SURGERY     reduction   KNEE ARTHROSCOPY     TONSILLECTOMY     Patient Active Problem List   Diagnosis Date Noted   Chronic migraine without aura, with intractable migraine, so stated, with status migrainosus 03/24/2023   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   Easy bruising 01/17/2023   Anxiety 11/26/2022   Annular dermatitis 06/07/2022   Pruritic disorder 07/17/2021   Numerous skin moles 07/17/2021   Asthma 07/15/2021   Back pain 07/15/2021   Carpal tunnel syndrome 07/15/2021   Chronic pain 07/15/2021   Hyperlipidemia, unspecified 07/15/2021   Hypertension 07/15/2021   Hypothyroid 07/15/2021   Nicotine dependence 07/15/2021   Other long term (current) drug therapy 07/15/2021   Vitamin B12 deficiency (non anemic) 07/15/2021   Prediabetes 04/24/2019   Osteoarthritis of left knee 08/23/2017   Atrial fibrillation (HCC) 07/03/2017   Bipolar disorder (HCC) 02/28/2007   Gastroesophageal reflux disease 11/12/2006   Osteoarthritis 11/12/2006    PCP: Stacey Drain   REFERRING PROVIDER: Anson Fret, MD   REFERRING DIAG:   [phone] (ICD-10-CM) - Chronic migraine without aura, with intractable migraine, so stated, with status migrainosus  M79.18 (ICD-10-CM) - Cervical myofascial pain syndrome  THERAPY DIAG:  Cervicalgia  Chronic intractable headache, unspecified headache type  Cramp and spasm  Muscle weakness (generalized)  Abnormal posture  Rationale for Evaluation and Treatment: Rehabilitation  ONSET DATE: 03/28/2023  SUBJECTIVE:  SUBJECTIVE STATEMENT: Patient reports she feels stiff and tight today.  Pain reported at 2-3/10.  Denies any headache symptoms.  Hand dominance: Right  PERTINENT HISTORY:  Chronic migraines with hx of heavy Nsaid use.   PAIN:  Are you having pain? Yes: NPRS scale: 2-3/10 Pain location: head and neck Pain description: pounding  Aggravating factors: stress Relieving factors: Was taking heavy amounts of nsaids and tylenol but MD has restricted her from taking   PRECAUTIONS: None  RED FLAGS: None     WEIGHT BEARING RESTRICTIONS: No  FALLS:  Has patient fallen in last 6 months? No  LIVING ENVIRONMENT: Lives with: lives with their spouse Lives in: House/apartment  OCCUPATION: not working  PLOF: Independent, Independent with basic ADLs, Independent with household mobility without device, Independent with community mobility without device, Independent with homemaking with ambulation, Independent with gait, and Independent with transfers  PATIENT GOALS: She hopes to reduce her headache frequency and severity.  She would like to try DN.    NEXT MD VISIT: unknown  OBJECTIVE:  Note: Objective measures were completed at Evaluation unless otherwise noted.  DIAGNOSTIC FINDINGS:  MRI HEAD WITHOUT CONTRASTMPRESSION: No acute intracranial abnormality and normal for age  noncontrast MRI appearance of the Brain  PATIENT SURVEYS:  FOTO 55, goal 41  COGNITION: Overall cognitive status: Within functional limits for tasks assessed  SENSATION: WFL  POSTURE: rounded shoulders and decreased lumbar lordosis  PALPATION: Extreme fascial restriction and muscle tension bilateral UEs   CERVICAL ROM:   Active ROM A/PROM (deg) eval  Flexion WNL  Extension WNL  Right lateral flexion 25  Left lateral flexion 22  Right rotation 40  Left rotation 40   (Blank rows = not tested)  UPPER EXTREMITY ROM:  WFL  UPPER EXTREMITY MMT:  All generally 4+ to 5/5 but likely weak postural musculature.    CERVICAL SPECIAL TESTS:  none  FUNCTIONAL TESTS:  5 times sit to stand: 14.74 sec Timed up and go (TUG): 9.13 sec  TODAY'S TREATMENT:                                                                                                                              DATE: 05/11/23 UBE x 6 min 3/3 at level 1 (patient had to take frequent rest breaks) 3 way scapular stabilization with blue loop x 5 each UE 4 D ball rolls with light blue plyo ball x 10 each direction on each UE Added postural tband exercises and issued red tband for HEP: instructed patient in proper technique Trigger Point Dry-Needling  Treatment instructions: Expect mild to moderate muscle soreness. S/S of pneumothorax if dry needled over a lung field, and to seek immediate medical attention should they occur. Patient verbalized understanding of these instructions and education. Patient Consent Given: Yes Education handout provided: Yes Muscles treated: bilateral upper trap Electrical stimulation performed: No Parameters: N/A Treatment response/outcome: Skilled palpation used to identify taut bands and trigger points.  Once identified, dry needling techniques used to treat these areas.  Twitch response ellicited along with palpable elongation of muscle.  Following treatment, patient reported significant  relief of tension in the muscles of her neck and shoulders.  No headache today.  DATE: 04/21/23 Initial evaluation completed and initiated HEP   PATIENT EDUCATION:  Education details: Educated on proper posture and relaxing her shoulders away from her ears Person educated: Patient Education method: Programmer, multimedia, Demonstration, and Verbal cues Education comprehension: verbalized understanding, returned demonstration, and verbal cues required  HOME EXERCISE PROGRAM: Access Code: [email or web address] URL: [email or web address] Date: 05/11/2023 Prepared by: Mikey Kirschner  Exercises - Seated Cervical Retraction  - 1 x daily - 7 x weekly - 3 sets - 10 reps - Shoulder Rolls in Sitting  - 1 x daily - 7 x weekly - 3 sets - 10 reps - Seated Scapular Retraction  - 1 x daily - 7 x weekly - 3 sets - 10 reps - Seated Cervical Rotation AROM  - 1 x daily - 7 x weekly - 3 sets - 10 reps - Seated Cervical Sidebending AROM  - 1 x daily - 7 x weekly - 3 sets - 10 reps - Shoulder extension with resistance - Neutral  - 1 x daily - 7 x weekly - 3 sets - 10 reps - Standing Shoulder Row with Anchored Resistance  - 1 x daily - 7 x weekly - 3 sets - 10 reps - Shoulder External Rotation and Scapular Retraction with Resistance  - 1 x daily - 7 x weekly - 3 sets - 10 reps - Standing Shoulder Horizontal Abduction with Resistance  - 1 x daily - 7 x weekly - 3 sets - 10 reps  ASSESSMENT:  CLINICAL IMPRESSION: Caylynn arrived with no headache but her usual shoulder and neck pain.  She fatigued quite easily on all exercises today and needed frequent rest breaks.  She had some difficulty getting into position for DN due to her back pain.  We added pillow under abdomen and she was able to get somewhat comfortable. She had prominent twitch responses in both upper traps Left > Right, along with deep ache reports during treatment of the cervical multifidi.   She would benefit from continued skilled PT for C spine ROM,  postural strengthening, STM, DN and modalities for pain relief.    OBJECTIVE IMPAIRMENTS: decreased ROM, decreased strength, increased fascial restrictions, increased muscle spasms, impaired flexibility, impaired UE functional use, postural dysfunction, and pain.   ACTIVITY LIMITATIONS: carrying, lifting, sleeping, transfers, bathing, dressing, reach over head, and hygiene/grooming  PARTICIPATION LIMITATIONS: meal prep, cleaning, laundry, driving, shopping, community activity, and yard work  PERSONAL FACTORS: Behavior pattern, Fitness, and 1-2 comorbidities: anxiety, bipolar disorder  are also affecting patient's functional outcome.   REHAB POTENTIAL: Fair due to chronic intensity of patients headaches  CLINICAL DECISION MAKING: Evolving/moderate complexity  EVALUATION COMPLEXITY: Moderate   GOALS: Goals reviewed with patient? Yes  SHORT TERM GOALS: Target date: 05/19/2023   Pain report to be no greater than 4/10  Baseline:  Goal status: INITIAL  2.  Headache frequency to be reduced by 25% Baseline:  Goal status: INITIAL  3.  Patient to be able to tolerate dry needling Baseline:  Goal status: INITIAL   LONG TERM GOALS: Target date: 06/16/2023  Patient to report pain no greater than 2/10  Baseline:  Goal status: INITIAL  2.  Reduce frequency of headaches to 1 time per month Baseline:  Goal status: INITIAL  3.  Patient to be independent with advanced HEP  Baseline:  Goal status: INITIAL  4.  Patient to be able to sleep through the night  Baseline:  Goal status: INITIAL  5.  Patient to report 85% improvement in overall symptoms  Baseline:  Goal status: INITIAL  6.  FOTO to be 63 Baseline:  Goal status: INITIAL   PLAN:  PT FREQUENCY: 1-2x/week  PT DURATION: 8 weeks  PLANNED INTERVENTIONS: 97110-Therapeutic exercises, 97530- Therapeutic activity, [id number]- Neuromuscular re-education, 97535- Self Care, 95621- Manual therapy, [id number]- Aquatic Therapy, 97014-  Electrical stimulation (unattended), [id number]- Electrical stimulation (manual), [id number]- Ultrasound, [id number]- Traction (mechanical), [id number]- Ionotophoresis 4mg /ml Dexamethasone, Patient/Family education, Taping, Dry Needling, Joint mobilization, Spinal mobilization, Cryotherapy, and Moist heat  PLAN FOR NEXT SESSION: UBE, review tband postural strengthening, possibly add shoulder exercises for stabilization, DN   Maureen Duesing B. Rona Tomson, PT 05/11/23 12:27 PM [facility] [address], [address] [place], [place] [address] Phone # [phone] Fax [phone]

## 2023-05-13 ENCOUNTER — Telehealth: Payer: Self-pay

## 2023-05-13 ENCOUNTER — Other Ambulatory Visit (HOSPITAL_COMMUNITY): Payer: Self-pay

## 2023-05-13 NOTE — Telephone Encounter (Signed)
Pharmacy Patient Advocate Encounter   Received notification from CoverMyMeds that prior authorization for Amanda [name] is required/requested.   Insurance verification completed.   The patient is insured through [employer] .   Per test claim: PA required; PA submitted to above mentioned insurance via CoverMyMeds Key/confirmation #/EOC Key: [id number]   Status is pending

## 2023-05-14 ENCOUNTER — Other Ambulatory Visit (HOSPITAL_COMMUNITY): Payer: Self-pay

## 2023-05-14 NOTE — Telephone Encounter (Signed)
Pharmacy Patient Advocate Encounter  Received notification from [facility] that Prior Authorization for Bernita Raisin has been APPROVED from 1.1.24 to 12.31.25. Ran test claim, Copay is $388.56 Please not Co-pay Assistance cards can be fpund online for the patient to Obtain to help with Cost. This test claim was processed through [facility]- copay amounts may vary at other pharmacies due to pharmacy/plan contracts, or as the patient moves through the different stages of their insurance plan.   PA #/Case ID/Reference #: [id number]

## 2023-05-17 ENCOUNTER — Telehealth: Payer: Self-pay | Admitting: Nurse Practitioner

## 2023-05-17 NOTE — Telephone Encounter (Signed)
PA was approved. 

## 2023-05-17 NOTE — Telephone Encounter (Signed)
Called pt regarding her migraine meds, as they are very expensive to see if she needed samples or how we can help, she states she is seeing neurologist now on Ajovy & they are giving her samples to help her.  She appreciated my call

## 2023-05-18 ENCOUNTER — Ambulatory Visit: Payer: Medicare HMO

## 2023-05-18 DIAGNOSIS — M542 Cervicalgia: Secondary | ICD-10-CM | POA: Diagnosis not present

## 2023-05-18 DIAGNOSIS — M6281 Muscle weakness (generalized): Secondary | ICD-10-CM

## 2023-05-18 DIAGNOSIS — G8929 Other chronic pain: Secondary | ICD-10-CM

## 2023-05-18 DIAGNOSIS — R252 Cramp and spasm: Secondary | ICD-10-CM

## 2023-05-18 DIAGNOSIS — R293 Abnormal posture: Secondary | ICD-10-CM

## 2023-05-18 NOTE — Therapy (Signed)
OUTPATIENT PHYSICAL THERAPY CERVICAL TREATMENT    Patient Name: Amanda [name] MRN: [id number] DOB:12/26/1955, 67 y.o., female Today's Date: 05/18/2023  END OF SESSION:  PT End of Session - 05/18/23 1505     Visit Number 3    Date for PT Re-Evaluation 06/16/23    Authorization Type [employer] MEDICARE HMO    PT Start Time 1448    PT Stop Time 1530    PT Time Calculation (min) 42 min    Activity Tolerance Patient limited by fatigue    Behavior During Therapy [facility] for tasks assessed/performed             Past Medical History:  Diagnosis Date   Atrial fibrillation (HCC)    DEGENERATIVE JOINT DISEASE 11/12/2006   DISORDER, BIPOLAR NOS 02/28/2007   GERD 11/12/2006   Major depressive disorder, single episode, unspecified 07/15/2021   Past Surgical History:  Procedure Laterality Date   ABDOMINAL HYSTERECTOMY     BREAST SURGERY     reduction   KNEE ARTHROSCOPY     TONSILLECTOMY     Patient Active Problem List   Diagnosis Date Noted   Chronic migraine without aura, with intractable migraine, so stated, with status migrainosus 03/24/2023   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   Easy bruising 01/17/2023   Anxiety 11/26/2022   Annular dermatitis 06/07/2022   Pruritic disorder 07/17/2021   Numerous skin moles 07/17/2021   Asthma 07/15/2021   Back pain 07/15/2021   Carpal tunnel syndrome 07/15/2021   Chronic pain 07/15/2021   Hyperlipidemia, unspecified 07/15/2021   Hypertension 07/15/2021   Hypothyroid 07/15/2021   Nicotine dependence 07/15/2021   Other long term (current) drug therapy 07/15/2021   Vitamin B12 deficiency (non anemic) 07/15/2021   Prediabetes 04/24/2019   Osteoarthritis of left knee 08/23/2017   Atrial fibrillation (HCC) 07/03/2017   Bipolar disorder (HCC) 02/28/2007   Gastroesophageal reflux disease 11/12/2006   Osteoarthritis 11/12/2006    PCP: Stacey Drain   REFERRING PROVIDER: Anson Fret, MD   REFERRING DIAG:   [phone] (ICD-10-CM) - Chronic migraine without aura, with intractable migraine, so stated, with status migrainosus  M79.18 (ICD-10-CM) - Cervical myofascial pain syndrome  THERAPY DIAG:  Abnormal posture  Cervicalgia  Cramp and spasm  Chronic intractable headache, unspecified headache type  Muscle weakness (generalized)  Rationale for Evaluation and Treatment: Rehabilitation  ONSET DATE: 03/28/2023  SUBJECTIVE:  SUBJECTIVE STATEMENT:  Denies any headache symptoms.  I'm doing ok.   Hand dominance: Right  PERTINENT HISTORY:  Chronic migraines with hx of heavy Nsaid use.   PAIN:  Are you having pain? Yes: NPRS scale: 2-3/10 Pain location: head and neck Pain description: pounding  Aggravating factors: stress Relieving factors: Was taking heavy amounts of nsaids and tylenol but MD has restricted her from taking   PRECAUTIONS: None  RED FLAGS: None     WEIGHT BEARING RESTRICTIONS: No  FALLS:  Has patient fallen in last 6 months? No  LIVING ENVIRONMENT: Lives with: lives with their spouse Lives in: House/apartment  OCCUPATION: not working  PLOF: Independent, Independent with basic ADLs, Independent with household mobility without device, Independent with community mobility without device, Independent with homemaking with ambulation, Independent with gait, and Independent with transfers  PATIENT GOALS: She hopes to reduce her headache frequency and severity.  She would like to try DN.    NEXT MD VISIT: unknown  OBJECTIVE:  Note: Objective measures were completed at Evaluation unless otherwise noted.  DIAGNOSTIC FINDINGS:  MRI HEAD WITHOUT CONTRASTMPRESSION: No acute intracranial abnormality and normal for age noncontrast MRI appearance of the Brain  PATIENT  SURVEYS:  FOTO 55, goal 50  COGNITION: Overall cognitive status: Within functional limits for tasks assessed  SENSATION: WFL  POSTURE: rounded shoulders and decreased lumbar lordosis  PALPATION: Extreme fascial restriction and muscle tension bilateral UEs   CERVICAL ROM:   Active ROM A/PROM (deg) eval  Flexion WNL  Extension WNL  Right lateral flexion 25  Left lateral flexion 22  Right rotation 40  Left rotation 40   (Blank rows = not tested)  UPPER EXTREMITY ROM:  WFL  UPPER EXTREMITY MMT:  All generally 4+ to 5/5 but likely weak postural musculature.    CERVICAL SPECIAL TESTS:  none  FUNCTIONAL TESTS:  5 times sit to stand: 14.74 sec Timed up and go (TUG): 9.13 sec  TODAY'S TREATMENT:                                                                                                                              DATE: 05/18/23 UBE x 6 min 3/3 at level 1 (patient had to take frequent rest breaks) 3 way scapular stabilization with blue loop x 5 each UE 4 D ball rolls with light blue plyo ball x 10 each direction on each UE Postural tband with red tband: shoulder ext, rows, horizontal abd and ER x 20 each Trigger Point Dry-Needling  Treatment instructions: Expect mild to moderate muscle soreness. S/S of pneumothorax if dry needled over a lung field, and to seek immediate medical attention should they occur. Patient verbalized understanding of these instructions and education. Patient Consent Given: Yes Education handout provided: Yes Muscles treated: bilateral upper traps, bilateral cervical multifidi, bilateral sub-occipital area. Electrical stimulation performed: No Parameters: N/A Treatment response/outcome: Skilled palpation used to identify taut bands and trigger points.  Once identified,  dry needling techniques used to treat these areas.  Twitch response ellicited along with palpable elongation of muscle.  Following treatment, patient reported significant relief  of tension in the muscles of her neck and shoulders.  No headache today.  DATE: 05/11/23 UBE x 6 min 3/3 at level 1 (patient had to take frequent rest breaks) 3 way scapular stabilization with blue loop x 5 each UE 4 D ball rolls with light blue plyo ball x 10 each direction on each UE Added postural tband exercises and issued red tband for HEP: instructed patient in proper technique Trigger Point Dry-Needling  Treatment instructions: Expect mild to moderate muscle soreness. S/S of pneumothorax if dry needled over a lung field, and to seek immediate medical attention should they occur. Patient verbalized understanding of these instructions and education. Patient Consent Given: Yes Education handout provided: Yes Muscles treated: bilateral upper trap Electrical stimulation performed: No Parameters: N/A Treatment response/outcome: Skilled palpation used to identify taut bands and trigger points.  Once identified, dry needling techniques used to treat these areas.  Twitch response ellicited along with palpable elongation of muscle.  Following treatment, patient reported significant relief of tension in the muscles of her neck and shoulders.  No headache today.  DATE: 04/21/23 Initial evaluation completed and initiated HEP   PATIENT EDUCATION:  Education details: Educated on proper posture and relaxing her shoulders away from her ears Person educated: Patient Education method: Programmer, multimedia, Demonstration, and Verbal cues Education comprehension: verbalized understanding, returned demonstration, and verbal cues required  HOME EXERCISE PROGRAM: Access Code: [email or web address] URL: [email or web address] Date: 05/11/2023 Prepared by: Mikey Kirschner  Exercises - Seated Cervical Retraction  - 1 x daily - 7 x weekly - 3 sets - 10 reps - Shoulder Rolls in Sitting  - 1 x daily - 7 x weekly - 3 sets - 10 reps - Seated Scapular Retraction  - 1 x daily - 7 x weekly - 3 sets - 10 reps - Seated  Cervical Rotation AROM  - 1 x daily - 7 x weekly - 3 sets - 10 reps - Seated Cervical Sidebending AROM  - 1 x daily - 7 x weekly - 3 sets - 10 reps - Shoulder extension with resistance - Neutral  - 1 x daily - 7 x weekly - 3 sets - 10 reps - Standing Shoulder Row with Anchored Resistance  - 1 x daily - 7 x weekly - 3 sets - 10 reps - Shoulder External Rotation and Scapular Retraction with Resistance  - 1 x daily - 7 x weekly - 3 sets - 10 reps - Standing Shoulder Horizontal Abduction with Resistance  - 1 x daily - 7 x weekly - 3 sets - 10 reps  ASSESSMENT:  CLINICAL IMPRESSION: Amanda [name] arrived with no headache.  She responded well to DN #1 and requested DN again.  She had several strong twitch responses in bilateral upper traps today.  She continues to fatigue very easily with postural strengthening.  She would benefit from thoracic mobility as well as continued shoulder and postural strengthening.  She would benefit from continued skilled PT for C spine ROM, postural strengthening, STM, DN and modalities for pain relief.    OBJECTIVE IMPAIRMENTS: decreased ROM, decreased strength, increased fascial restrictions, increased muscle spasms, impaired flexibility, impaired UE functional use, postural dysfunction, and pain.   ACTIVITY LIMITATIONS: carrying, lifting, sleeping, transfers, bathing, dressing, reach over head, and hygiene/grooming  PARTICIPATION LIMITATIONS: meal prep, cleaning, laundry, driving, shopping, community activity, and yard work  PERSONAL FACTORS: Behavior pattern, Fitness, and 1-2 comorbidities: anxiety, bipolar disorder  are also affecting patient's functional outcome.   REHAB POTENTIAL: Fair due to chronic intensity of patients headaches  CLINICAL DECISION MAKING: Evolving/moderate complexity  EVALUATION COMPLEXITY: Moderate   GOALS: Goals reviewed with patient? Yes  SHORT TERM GOALS: Target date: 05/19/2023   Pain report to be no greater than 4/10  Baseline:   Goal status: INITIAL  2.  Headache frequency to be reduced by 25% Baseline:  Goal status: INITIAL  3.  Patient to be able to tolerate dry needling Baseline:  Goal status: INITIAL   LONG TERM GOALS: Target date: 06/16/2023  Patient to report pain no greater than 2/10  Baseline:  Goal status: INITIAL  2.  Reduce frequency of headaches to 1 time per month Baseline:  Goal status: INITIAL  3.  Patient to be independent with advanced HEP  Baseline:  Goal status: INITIAL  4.  Patient to be able to sleep through the night  Baseline:  Goal status: INITIAL  5.  Patient to report 85% improvement in overall symptoms  Baseline:  Goal status: INITIAL  6.  FOTO to be 63 Baseline:  Goal status: INITIAL   PLAN:  PT FREQUENCY: 1-2x/week  PT DURATION: 8 weeks  PLANNED INTERVENTIONS: 97110-Therapeutic exercises, 97530- Therapeutic activity, [id number]- Neuromuscular re-education, 97535- Self Care, [id number]- Manual therapy, [id number]- Aquatic Therapy, 97014- Electrical stimulation (unattended), [id number]- Electrical stimulation (manual), [id number]- Ultrasound, [id number]- Traction (mechanical), [id number]- Ionotophoresis 4mg /ml Dexamethasone, Patient/Family education, Taping, Dry Needling, Joint mobilization, Spinal mobilization, Cryotherapy, and Moist heat  PLAN FOR NEXT SESSION: UBE, add thoracic mobility, possibly add shoulder exercises for stabilization, DN   Amanda [name], PT 05/18/23 5:40 PM [facility] [address], [address] [place], [place] [address] Phone # [phone] Fax [phone]

## 2023-05-25 NOTE — Telephone Encounter (Signed)
She desaturated overnight. She refuses a sleep study. I would ask her to follow up with primary care she may need oxygen overnight or if she has a pulmonologist. Please fax ONO to pcp and if has a pulmonologist. Let her know  I ordered this bc of intractable headaches which can be caused by hypoxemia overnight if she refuses to treat I may not be able to help her with headaches. thanks  Huntley Dec, I will fax you the ONO, 166 desat events and Sp)2 fell to 88% and she qualifies for overnight O2 although I would love for her to get a sleep evaluation maybe with pulmonology. I cannot address this so have to send to you, or you can send to pulmonology to see if they would put her on O2. I ordered this bc of intractable headaches which can be caused by hypoxemia overnight if she refuses to treat I may not be able to help her with headaches.

## 2023-05-25 NOTE — Telephone Encounter (Signed)
Received ONO results. They  have been placed in Dr Trevor Mace office for review/comment.

## 2023-05-25 NOTE — Telephone Encounter (Signed)
I spoke with the patient.  She had a few questions that I was able to answer and she has decided to proceed with a sleep consultation and subsequent sleep study to rule out sleep apnea.  She is concerned about wearing a mask and feeling "panic" as she has COPD but she is, however, willing to discuss different mask options that may be smaller/less invasive. I told her I would talk to Dr Lucia Gaskins.

## 2023-05-26 ENCOUNTER — Telehealth (HOSPITAL_COMMUNITY): Payer: Self-pay | Admitting: *Deleted

## 2023-05-26 ENCOUNTER — Ambulatory Visit: Payer: Medicare HMO

## 2023-05-26 NOTE — Telephone Encounter (Addendum)
Patient called in stating she is having red blood in her stool since restarting Eliquis for the last week. Denies black stool or blood clots. Per Landry Mellow PA stop Eliquis with low CHA2DS2VASc score of 1-2 ( pt denies BP dx) - can hold Eliquis for now. Pt instructed to contact her PCP for further assessment and workup regarding blood in stool. Pt did have recent colonoscopy in June which revealed internal hemorrhoids and diverticulitis. Pt has follow up with Dr. Nelly Laurence in January for reassessment.

## 2023-05-31 ENCOUNTER — Ambulatory Visit: Payer: Medicare HMO | Admitting: Nurse Practitioner

## 2023-06-04 ENCOUNTER — Ambulatory Visit: Payer: Medicare HMO

## 2023-06-04 DIAGNOSIS — M542 Cervicalgia: Secondary | ICD-10-CM

## 2023-06-04 DIAGNOSIS — M6281 Muscle weakness (generalized): Secondary | ICD-10-CM

## 2023-06-04 DIAGNOSIS — R252 Cramp and spasm: Secondary | ICD-10-CM

## 2023-06-04 DIAGNOSIS — R293 Abnormal posture: Secondary | ICD-10-CM

## 2023-06-04 DIAGNOSIS — G8929 Other chronic pain: Secondary | ICD-10-CM

## 2023-06-04 NOTE — Therapy (Addendum)
 OUTPATIENT PHYSICAL THERAPY CERVICAL TREATMENT  PHYSICAL THERAPY DISCHARGE SUMMARY  Visits from Start of Care: 4  Current functional level related to goals / functional outcomes: See below   Remaining deficits: See below   Education / Equipment: See below   Patient agrees to discharge. Patient goals were partially met. Patient is being discharged due to not returning since the last visit.    Patient Name: Amanda [name] MRN: [id number] DOB:1955-08-03, 67 y.o., female Today's Date: 08/13/2023  END OF SESSION:    Past Medical History:  Diagnosis Date   Atrial fibrillation [facility])    DEGENERATIVE JOINT DISEASE 11/12/2006   DISORDER, BIPOLAR NOS 02/28/2007   GERD 11/12/2006   Major depressive disorder, single episode, unspecified 07/15/2021   Past Surgical History:  Procedure Laterality Date   ABDOMINAL HYSTERECTOMY     ATRIAL FIBRILLATION ABLATION N/A 07/26/2023   Procedure: ATRIAL FIBRILLATION ABLATION;  Surgeon: Maurice Small, MD;  Location: [facility];  Service: Cardiovascular;  Laterality: N/A;   BREAST SURGERY     reduction   KNEE ARTHROSCOPY     TONSILLECTOMY     Patient Active Problem List   Diagnosis Date Noted   Leg swelling 06/08/2023   Slow transit constipation 06/08/2023   Diverticulosis 06/08/2023   Fatty infiltration of liver 06/08/2023   Fatty pancreas 06/08/2023   Calcification of abdominal aorta (HCC) 06/08/2023   Chronic migraine without aura, with intractable migraine, so stated, with status migrainosus 03/24/2023   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   Easy bruising 01/17/2023   Anxiety 11/26/2022   Annular dermatitis 06/07/2022   Pruritic disorder 07/17/2021   Numerous skin moles 07/17/2021   Acute bronchitis with COPD (HCC) 07/15/2021   Back pain 07/15/2021   Carpal tunnel syndrome 07/15/2021   Chronic pain 07/15/2021   Hyperlipidemia, unspecified 07/15/2021   Hypertension 07/15/2021   Hypothyroid 07/15/2021    Nicotine dependence 07/15/2021   Other long term (current) drug therapy 07/15/2021   Vitamin B12 deficiency (non anemic) 07/15/2021   Prediabetes 04/24/2019   Atrial fibrillation (HCC) 07/03/2017   Bipolar disorder (HCC) 02/28/2007   Gastroesophageal reflux disease 11/12/2006   Osteoarthritis 11/12/2006    PCP: Stacey Drain   REFERRING PROVIDER: Anson Fret, MD   REFERRING DIAG:  [phone] (ICD-10-CM) - Chronic migraine without aura, with intractable migraine, so stated, with status migrainosus  M79.18 (ICD-10-CM) - Cervical myofascial pain syndrome  THERAPY DIAG:  Abnormal posture  Cervicalgia  Cramp and spasm  Muscle weakness (generalized)  Chronic intractable headache, unspecified headache type  Rationale for Evaluation and Treatment: Rehabilitation  ONSET DATE: 03/28/2023  SUBJECTIVE:  SUBJECTIVE STATEMENT:  Patient reports her neck is doing pretty good.  She figured out why she was having headaches in the morning.  Provider had her do an overnight pulse oximeter test and it was found that her oxygen was averaging around 79-80 %.  Today, she is having a lot of foot pain due to her plantar fascitis.  She also struggles with spinal stenosis so this interferes with some positioning for neck and shoulder exercises.      Hand dominance: Right  PERTINENT HISTORY:  Chronic migraines with hx of heavy Nsaid use.   PAIN:  Are you having pain? Yes: NPRS scale: 2-3/10 Pain location: head and neck Pain description: pounding  Aggravating factors: stress Relieving factors: Was taking heavy amounts of nsaids and tylenol but MD has restricted her from taking   PRECAUTIONS: None  RED FLAGS: None     WEIGHT BEARING RESTRICTIONS: No  FALLS:  Has patient fallen in last  6 months? No  LIVING ENVIRONMENT: Lives with: lives with their spouse Lives in: House/apartment  OCCUPATION: not working  PLOF: Independent, Independent with basic ADLs, Independent with household mobility without device, Independent with community mobility without device, Independent with homemaking with ambulation, Independent with gait, and Independent with transfers  PATIENT GOALS: She hopes to reduce her headache frequency and severity.  She would like to try DN.    NEXT MD VISIT: unknown  OBJECTIVE:  Note: Objective measures were completed at Evaluation unless otherwise noted.  DIAGNOSTIC FINDINGS:  MRI HEAD WITHOUT CONTRASTMPRESSION: No acute intracranial abnormality and normal for age noncontrast MRI appearance of the Brain  PATIENT SURVEYS:  FOTO 55, goal 79  COGNITION: Overall cognitive status: Within functional limits for tasks assessed  SENSATION: WFL  POSTURE: rounded shoulders and decreased lumbar lordosis  PALPATION: Extreme fascial restriction and muscle tension bilateral UEs   CERVICAL ROM:   Active ROM A/PROM (deg) eval  Flexion WNL  Extension WNL  Right lateral flexion 25  Left lateral flexion 22  Right rotation 40  Left rotation 40   (Blank rows = not tested)  UPPER EXTREMITY ROM:  WFL  UPPER EXTREMITY MMT:  All generally 4+ to 5/5 but likely weak postural musculature.    CERVICAL SPECIAL TESTS:  none  FUNCTIONAL TESTS:  5 times sit to stand: 14.74 sec Timed up and go (TUG): 9.13 sec  TODAY'S TREATMENT:                                                                                                                              DATE: 06/04/23 UBE x 6 min 3/3 at level 1 (patient had to take frequent rest breaks) 3 way scapular stabilization with blue loop x 5 each UE 4 D ball rolls with light blue plyo ball x 10 each direction on each UE Lat pull down with 40 lbs 2 x 10 Matrix row 25 lbs 2 x 10 Matrix tricep press with 20 lbs 2 x  10 Prone shoulder ext, rows, horizontal abduction (left only, became dizzy when we switched to her right UE, therefore we held on these on the right UE) with 2 lbs 2 x 10 Side lying shoulder ER bilaterally with 2 lbs 2 x 10 Supine serratus punch with 2 lbs 2 x 10 bilaterally Trigger Point Dry-Needling  Treatment instructions: Expect mild to moderate muscle soreness. S/S of pneumothorax if dry needled over a lung field, and to seek immediate medical attention should they occur. Patient verbalized understanding of these instructions and education. Patient Consent Given: Yes Education handout provided: Yes Muscles treated: bilateral upper traps, bilateral cervical multifidi, bilateral sub-occipital area. Electrical stimulation performed: No Parameters: N/A Treatment response/outcome: Skilled palpation used to identify taut bands and trigger points.  Once identified, dry needling techniques used to treat these areas.  Deep ache reported.  Patient's c spine is very difficult to access due to severe fwd head.  Upper traps are quite atrophied. She gets very little to no twitch response in upper traps.  Following treatment, patient reported relief of tension in the muscles of her neck and shoulders.  No headache today.  DATE: 05/18/23 UBE x 6 min 3/3 at level 1 (patient had to take frequent rest breaks) 3 way scapular stabilization with blue loop x 5 each UE 4 D ball rolls with light blue plyo ball x 10 each direction on each UE Postural tband with red tband: shoulder ext, rows, horizontal abd and ER x 20 each Trigger Point Dry-Needling  Treatment instructions: Expect mild to moderate muscle soreness. S/S of pneumothorax if dry needled over a lung field, and to seek immediate medical attention should they occur. Patient verbalized understanding of these instructions and education. Patient Consent Given: Yes Education handout provided: Yes Muscles treated: bilateral upper traps, bilateral cervical  multifidi, bilateral sub-occipital area. Electrical stimulation performed: No Parameters: N/A Treatment response/outcome: Skilled palpation used to identify taut bands and trigger points.  Once identified, dry needling techniques used to treat these areas.  Twitch response ellicited along with palpable elongation of muscle.  Following treatment, patient reported significant relief of tension in the muscles of her neck and shoulders.  No headache today.  DATE: 05/11/23 UBE x 6 min 3/3 at level 1 (patient had to take frequent rest breaks) 3 way scapular stabilization with blue loop x 5 each UE 4 D ball rolls with light blue plyo ball x 10 each direction on each UE Added postural tband exercises and issued red tband for HEP: instructed patient in proper technique Trigger Point Dry-Needling  Treatment instructions: Expect mild to moderate muscle soreness. S/S of pneumothorax if dry needled over a lung field, and to seek immediate medical attention should they occur. Patient verbalized understanding of these instructions and education. Patient Consent Given: Yes Education handout provided: Yes Muscles treated: bilateral upper trap Electrical stimulation performed: No Parameters: N/A Treatment response/outcome: Skilled palpation used to identify taut bands and trigger points.  Once identified, dry needling techniques used to treat these areas.  Twitch response ellicited along with palpable elongation of muscle.  Following treatment, patient reported significant relief of tension in the muscles of her neck and shoulders.  No headache today.  DATE: 04/21/23 Initial evaluation completed and initiated HEP   PATIENT EDUCATION:  Education details: Educated on proper posture and relaxing her shoulders away from her ears Person educated: Patient Education method: Programmer, multimedia, Demonstration, and Verbal cues Education comprehension: verbalized understanding, returned demonstration, and verbal cues  required  HOME EXERCISE PROGRAM:  Access Code: [email or web address] URL: [email or web address] Date: 05/11/2023 Prepared by: Mikey Kirschner  Exercises - Seated Cervical Retraction  - 1 x daily - 7 x weekly - 3 sets - 10 reps - Shoulder Rolls in Sitting  - 1 x daily - 7 x weekly - 3 sets - 10 reps - Seated Scapular Retraction  - 1 x daily - 7 x weekly - 3 sets - 10 reps - Seated Cervical Rotation AROM  - 1 x daily - 7 x weekly - 3 sets - 10 reps - Seated Cervical Sidebending AROM  - 1 x daily - 7 x weekly - 3 sets - 10 reps - Shoulder extension with resistance - Neutral  - 1 x daily - 7 x weekly - 3 sets - 10 reps - Standing Shoulder Row with Anchored Resistance  - 1 x daily - 7 x weekly - 3 sets - 10 reps - Shoulder External Rotation and Scapular Retraction with Resistance  - 1 x daily - 7 x weekly - 3 sets - 10 reps - Standing Shoulder Horizontal Abduction with Resistance  - 1 x daily - 7 x weekly - 3 sets - 10 reps  ASSESSMENT:  CLINICAL IMPRESSION: Tyyonna arrived with no headache once again.  We added bilateral shoulder strengthening in prone today.  She really struggles with this due to her severe fwd head and lumbar stenosis.  She completed all shoulder exercises on the left UE but could not tolerate being on her stomach long enough to complete the same exercises for the right shoulder.  She had no twitch response in bilateral upper traps today but reported some deep ache on a few of the needle insertions.   She would benefit from continued skilled PT for C spine ROM, postural strengthening, STM, DN and modalities for pain relief.    OBJECTIVE IMPAIRMENTS: decreased ROM, decreased strength, increased fascial restrictions, increased muscle spasms, impaired flexibility, impaired UE functional use, postural dysfunction, and pain.   ACTIVITY LIMITATIONS: carrying, lifting, sleeping, transfers, bathing, dressing, reach over head, and hygiene/grooming  PARTICIPATION LIMITATIONS: meal  prep, cleaning, laundry, driving, shopping, community activity, and yard work  PERSONAL FACTORS: Behavior pattern, Fitness, and 1-2 comorbidities: anxiety, bipolar disorder  are also affecting patient's functional outcome.   REHAB POTENTIAL: Fair due to chronic intensity of patients headaches  CLINICAL DECISION MAKING: Evolving/moderate complexity  EVALUATION COMPLEXITY: Moderate   GOALS: Goals reviewed with patient? Yes  SHORT TERM GOALS: Target date: 05/19/2023   Pain report to be no greater than 4/10  Baseline:  Goal status: INITIAL  2.  Headache frequency to be reduced by 25% Baseline:  Goal status: INITIAL  3.  Patient to be able to tolerate dry needling Baseline:  Goal status: INITIAL   LONG TERM GOALS: Target date: 06/16/2023  Patient to report pain no greater than 2/10  Baseline:  Goal status: INITIAL  2.  Reduce frequency of headaches to 1 time per month Baseline:  Goal status: INITIAL  3.  Patient to be independent with advanced HEP  Baseline:  Goal status: INITIAL  4.  Patient to be able to sleep through the night  Baseline:  Goal status: INITIAL  5.  Patient to report 85% improvement in overall symptoms  Baseline:  Goal status: INITIAL  6.  FOTO to be 63 Baseline:  Goal status: INITIAL   PLAN:  PT FREQUENCY: 1-2x/week  PT DURATION: 8 weeks  PLANNED INTERVENTIONS: 97110-Therapeutic exercises, 97530- Therapeutic activity, [id number]- Neuromuscular re-education, 97535- Self Care, [id number]- Manual  therapy, [id number]- Aquatic Therapy, 97014- Electrical stimulation (unattended), [id number]- Electrical stimulation (manual), 09811- Ultrasound, [id number]- Traction (mechanical), 91478- Ionotophoresis 4mg /ml Dexamethasone, Patient/Family education, Taping, Dry Needling, Joint mobilization, Spinal mobilization, Cryotherapy, and Moist heat  PLAN FOR NEXT SESSION: UBE, add thoracic mobility, progress shoulder exercises for stabilization, DN   Neya Creegan B. Orlander [place],  PT 08/13/23 10:44 AM [facility] [address] [place], [place] [address] Phone # [phone] Fax [phone]  DC completed Lilyan Gilford. Heberto Sturdevant, PT 08/13/23 10:44 AM

## 2023-06-05 ENCOUNTER — Other Ambulatory Visit: Payer: Self-pay | Admitting: Physician Assistant

## 2023-06-08 ENCOUNTER — Encounter: Payer: Self-pay | Admitting: Nurse Practitioner

## 2023-06-08 ENCOUNTER — Ambulatory Visit (INDEPENDENT_AMBULATORY_CARE_PROVIDER_SITE_OTHER): Payer: Medicare HMO | Admitting: Nurse Practitioner

## 2023-06-08 VITALS — BP 132/78 | HR 78 | Wt 171.0 lb

## 2023-06-08 DIAGNOSIS — G43B1 Ophthalmoplegic migraine, intractable: Secondary | ICD-10-CM

## 2023-06-08 DIAGNOSIS — M7989 Other specified soft tissue disorders: Secondary | ICD-10-CM | POA: Insufficient documentation

## 2023-06-08 DIAGNOSIS — K8689 Other specified diseases of pancreas: Secondary | ICD-10-CM | POA: Insufficient documentation

## 2023-06-08 DIAGNOSIS — I700 Atherosclerosis of aorta: Secondary | ICD-10-CM | POA: Insufficient documentation

## 2023-06-08 DIAGNOSIS — I480 Paroxysmal atrial fibrillation: Secondary | ICD-10-CM | POA: Diagnosis not present

## 2023-06-08 DIAGNOSIS — F17219 Nicotine dependence, cigarettes, with unspecified nicotine-induced disorders: Secondary | ICD-10-CM

## 2023-06-08 DIAGNOSIS — J209 Acute bronchitis, unspecified: Secondary | ICD-10-CM

## 2023-06-08 DIAGNOSIS — K5901 Slow transit constipation: Secondary | ICD-10-CM | POA: Insufficient documentation

## 2023-06-08 DIAGNOSIS — R601 Generalized edema: Secondary | ICD-10-CM | POA: Insufficient documentation

## 2023-06-08 DIAGNOSIS — J440 Chronic obstructive pulmonary disease with acute lower respiratory infection: Secondary | ICD-10-CM

## 2023-06-08 DIAGNOSIS — K5790 Diverticulosis of intestine, part unspecified, without perforation or abscess without bleeding: Secondary | ICD-10-CM | POA: Insufficient documentation

## 2023-06-08 DIAGNOSIS — K760 Fatty (change of) liver, not elsewhere classified: Secondary | ICD-10-CM | POA: Insufficient documentation

## 2023-06-08 MED ORDER — FUROSEMIDE 20 MG PO TABS: 20.0000 mg | ORAL_TABLET | Freq: Every day | ORAL | 3 refills | Status: DC | PRN

## 2023-06-08 NOTE — Assessment & Plan Note (Signed)
>>  ASSESSMENT AND PLAN FOR INTRACTABLE OPHTHALMOPLEGIC MIGRAINE WRITTEN ON 06/08/2023  2:27 PM BY Tressia Labrum E, NP  Persistent left-sided headaches, possibly vascular. Discussed potential benefit of Botox and other management strategies. - Consider referral for Botox treatment - Continue current headache management - Monitor headache frequency and severity

## 2023-06-08 NOTE — Progress Notes (Signed)
 Amanda Doing, DNP, AGNP-c [facility] [address] [place], [place] [address] Main Office [phone]  ESTABLISHED PATIENT- Hospital Follow-Up Visit  Blood pressure 132/78, pulse 78, weight 171 lb (77.6 kg), SpO2 96%.  HPI  Amanda [name]  is a 67 y.o. year old female presenting today for evaluation and management following hospitalization.   Date of Hospitalization: 06/05/2023 Admitted: No Presented with CP, LE Edema, ShOB, Wheezing.   Primary Diagnosis: Bacterial COPD exacerbation, LE edema, Abdominal Pain New Medications: Yes: azithromycin, prednisone burst, lasix  40mg  PCP to refill lasix  as appropriate.  D/C'd Chronic Medications: No New Providers: No Speciality F/U Scheduled: No Lab/Imaging Concerns that need further evaluation: Yes: Fatty liver, fatty pancreas, worsening COPD, Abd aortic calcification.   Fatty liver, worsening COPD with possible overlapping bronchitis, fatty infiltration of pancreas, abdominal aortic calcification, DDD L5-S1  Serial trop normal, no DVT, BNP normal, Electrolytes normal, Kidney fx normal, liver enzymes normal. Slight increase in abs imm granulocyte indicating Richrd Kuzniar infection.   Elora tells me today: Amanda [name] presented with concerns about recent episodes of leg swelling. The swelling began on a Saturday, with significant improvement noted by today (Tuesday). She reported taking Lasix  given to her in the ED, which seemed to alleviate the swelling.   She also reported a recent increase in episodes of AFib, with two weeks of continuous AFib requiring diltiazem  for rate control. She does not believe she has had Afib this week but admits there are times when she cannot tell.   She also reported ongoing stomach issues and severe headaches. A recent CT scan revealed mild diverticulitis and fatty liver. She reported constipation and bloating, which worsens after eating. She has been taking magnesium and plans to start Linzess,  despite previous experiences of severe headaches with this medication.  She also reported a history of smoking and was considering strategies for cessation. She has been experiencing shortness of breath, which was attributed to a diagnosis of COPD with overlapping bronchitis. She has three antibiotics left and feels that this is improved, although admits to significant shortness of breath at baseline.   She also reported pain in the foot, which was being investigated for possible gout and toenail fungus. Both concerns are monitored by podiatry.  The patient's symptoms and medical history suggest a complex interplay of cardiovascular, gastrointestinal, and neurological issues. The patient's AFib appears to be poorly controlled, leading to potential fluid retention and leg swelling. The gastrointestinal symptoms, including diverticulitis and fatty liver, may be contributing to the patient's discomfort and bloating. The severe, one-sided headaches remain a significant concern for the patient, with potential links to medication side effects or vascular issues. The patient's smoking history and COPD diagnosis add another layer of complexity to the overall clinical picture. There is no true single causative factor present at this time.   ROS All ROS negative with exception of what is listed in HPI  PHYSICAL EXAM Physical Exam Vitals and nursing note reviewed.  Constitutional:      General: She is not in acute distress.    Appearance: Normal appearance.  Eyes:     Conjunctiva/sclera: Conjunctivae normal.  Cardiovascular:     Rate and Rhythm: Normal rate and regular rhythm.     Pulses: Normal pulses.          Dorsalis pedis pulses are 2+ on the right side and 2+ on the left side.       Posterior tibial pulses are 2+ on the right side and 2+ on the left  side.     Heart sounds: Normal heart sounds.  Pulmonary:     Effort: Pulmonary effort is normal.     Breath sounds: Wheezing present.   Musculoskeletal:        General: Normal range of motion.     Right lower leg: No edema.     Left lower leg: No edema.  Feet:     Right foot:     Skin integrity: Skin integrity normal.     Toenail Condition: Fungal disease present.    Left foot:     Skin integrity: Skin integrity normal.     Toenail Condition: Fungal disease present. Skin:    General: Skin is warm and dry.     Capillary Refill: Capillary refill takes less than 2 seconds.  Neurological:     General: No focal deficit present.     Mental Status: She is alert and oriented to person, place, and time.     Motor: No weakness.     Gait: Gait normal.  Psychiatric:        Mood and Affect: Mood normal.      ASSESSMENT & PLAN Problem List Items Addressed This Visit     Atrial fibrillation (HCC)   Recurrent AFib episodes lasting 3-4 days, requiring diltiazem  for rhythm control. Referred to AFib clinic for ablation. Discussed ablation benefits and necessity due to frequent episodes. Chart review shows a-flutter instances present, as well. Unclear if this is contributing to her LE edema.  - Continue diltiazem  as needed - Follow up with AFib clinic for ablation - Report any changes in AFib symptoms      Relevant Medications   furosemide  (LASIX ) 20 MG tablet   Acute bronchitis with COPD (HCC)   COPD with bronchitis, history of smoking, experiencing dyspnea. Discussed smoking cessation and provided resources. - Advise smoking cessation - Provide 1-800-QUIT-NOW resources - Monitor respiratory symptoms       Relevant Medications   azithromycin (ZITHROMAX) 250 MG tablet   Nicotine  dependence   Information on smoking cessation provided. Recommend close review and consideration of use of 1-800-QUIT-NOW for additional assistance. We can consider trial of bupropion if desired.       Intractable ophthalmoplegic migraine   Persistent left-sided headaches, possibly vascular. Discussed potential benefit of Botox and other  management strategies. - Consider referral for Botox treatment - Continue current headache management - Monitor headache frequency and severity      Relevant Medications   furosemide  (LASIX ) 20 MG tablet   Leg swelling - Primary   Relevant Medications   furosemide  (LASIX ) 20 MG tablet   Slow transit constipation   Difficulty with bowel movements despite magnesium and yogurt. Severe headaches with Linzess and Dulcolax. Discussed alternative fiber supplements and dietary changes. - Increase fiber intake with Metamucil, Fibercon, or Benefiber - Consider alternative laxatives if needed - Monitor bowel movements      Diverticulosis   Mild diverticulosis diagnosed via colonoscopy. Advised on fiber intake and dietary management during flare-ups. - Increase fiber intake with Metamucil, Fibercon, or Benefiber - Advise liquid diet during flare-ups - Monitor for diverticulitis symptoms      Fatty infiltration of liver   Fatty liver incidentally found on imaging. No signs of liver dysfunction at this time. Discussed dietary changes and fiber intake. - Increase fiber intake with Metamucil, Fibercon, or Benefiber - Monitor liver function with periodic labs      Fatty pancreas   Incidental diagnosis noted on imaging. Mild symptoms of bloating and  intermittent pain in the abdomen noted by patient. Labs have been unrevealing. With generalized symptoms in the setting of multiple GI conditions, it is not possible to say these symptoms are related. Will continue to monitor closely.       Calcification of abdominal aorta (HCC)   Relevant Medications   furosemide  (LASIX ) 20 MG tablet    FOLLOW-UP As Scheduled   SaraBeth Hazelene Doten, DNP, AGNP-c

## 2023-06-08 NOTE — Assessment & Plan Note (Signed)
 Recurrent AFib episodes lasting 3-4 days, requiring diltiazem  for rhythm control. Referred to AFib clinic for ablation. Discussed ablation benefits and necessity due to frequent episodes. Chart review shows a-flutter instances present, as well. Unclear if this is contributing to her LE edema.  - Continue diltiazem  as needed - Follow up with AFib clinic for ablation - Report any changes in AFib symptoms

## 2023-06-08 NOTE — Assessment & Plan Note (Signed)
 Difficulty with bowel movements despite magnesium and yogurt. Severe headaches with Linzess and Dulcolax. Discussed alternative fiber supplements and dietary changes. - Increase fiber intake with Metamucil, Fibercon, or Benefiber - Consider alternative laxatives if needed - Monitor bowel movements

## 2023-06-08 NOTE — Assessment & Plan Note (Signed)
Information on smoking cessation provided. Recommend close review and consideration of use of 1-800-QUIT-NOW for additional assistance. We can consider trial of bupropion if desired.

## 2023-06-08 NOTE — Assessment & Plan Note (Signed)
Mild diverticulosis diagnosed via colonoscopy. Advised on fiber intake and dietary management during flare-ups. - Increase fiber intake with Metamucil, Fibercon, or Benefiber - Advise liquid diet during flare-ups - Monitor for diverticulitis symptoms

## 2023-06-08 NOTE — Patient Instructions (Addendum)
 Your recent hospital visit showed the following:  Fat in the liver and pancreas Worsening COPD Bronchitis Plaque build-up inside the large artery from the heart leading to the rest of the body Arthritis and breakdown of the low back bones   Fatty liver and pancreas are concerning for the increased risk of permanent changes this can make to both organs. The liver is responsible for filtering blood. When too many fat cells are present, this can eventually cause the liver to become stiff and hard and it will no longer filter. Eventually, this can lead to liver failure.  The best treatment is weight loss, cholesterol control, and diet monitoring to keep fat intake low.   COPD is a chronic disease of the lung caused from permanent damage to the small sacs in the lungs that hold air. The lungs look similar to a bunch of grapes with each grape being an air sac. In COPD, the air sacs burst and the grapes form one large air pocket. This pocket causes the air we breath to get trapped and new air cannot get into the sac to send oxygen to the body. This causes shortness of breath, tiredness, weakness, difficulty walking and standing. This also puts extra strain on our body because we don't have the amount of oxygen feeding our body like we should.  There is no cure or reversal for COPD, but you can prevent worsening of the disease by reducing the cause. Smoking is the #1 cause of COPD. We know that just hours after a person stops smoking, the lungs begin to heal and the risk of further damage starts to go down.  I STRONGLY recommend you quit smoking, if you have not. I can help set you up with resources to aid in this process.   The calcification in the aorta means that you have build-up inside the large artery that sends blood from the heart to the entire body. The arteries are small flexible hoses, essentially. When plaque/calcium builds up, there is less room for blood and the artery becomes less flexible  and hard. This causes increased pressure inside the artery and can lead to chunks of the calcium breaking off from the pushing of blood (stroke), the artery bursting from the pressure (aneurysm), or complete blockage of the path of blood (in the heart this is a heart attack, in the brain this is a stroke, and the body this is ischemia).  The treat for this is management of cholesterol and weight. The cardiologist may also recommend procedures to help strengthen the blood vessel or remove some of the build-up. I would like you to speak to your cardiologist about this finding and discuss recommendations.   Arthritis is the normal wear and tear on the joints that happens over time. This can lead to the joint spaces becoming smaller causing the bones to rub against each other. There is no reversal for arthritis.  The best treatment for this is regular stretching and exercise to keep the joints moving. Tylenol  and ibuprofen can be helpful for pain.     Increase your fiber intake with something like Metamucil, Fibercon, Benefiber. These are easy to mix. You want to have 20 - 30 grams a day.    If the ablation doesn't help the afib/swelling then we will send a referral to the vascular doctor to make sure there aren't issues with your blood vessels in your legs.   Continue with the lasix  as needed for swelling. Keep your legs propped up  when sitting.

## 2023-06-08 NOTE — Assessment & Plan Note (Signed)
 COPD with bronchitis, history of smoking, experiencing dyspnea. Discussed smoking cessation and provided resources. - Advise smoking cessation - Provide 1-800-QUIT-NOW resources - Monitor respiratory symptoms

## 2023-06-08 NOTE — Assessment & Plan Note (Signed)
Fatty liver incidentally found on imaging. No signs of liver dysfunction at this time. Discussed dietary changes and fiber intake. - Increase fiber intake with Metamucil, Fibercon, or Benefiber - Monitor liver function with periodic labs

## 2023-06-08 NOTE — Assessment & Plan Note (Signed)
 Persistent left-sided headaches, possibly vascular. Discussed potential benefit of Botox and other management strategies. - Consider referral for Botox treatment - Continue current headache management - Monitor headache frequency and severity

## 2023-06-08 NOTE — Assessment & Plan Note (Signed)
 Incidental diagnosis noted on imaging. Mild symptoms of bloating and intermittent pain in the abdomen noted by patient. Labs have been unrevealing. With generalized symptoms in the setting of multiple GI conditions, it is not possible to say these symptoms are related. Will continue to monitor closely.

## 2023-06-14 ENCOUNTER — Encounter: Payer: Self-pay | Admitting: Cardiovascular Disease

## 2023-06-14 ENCOUNTER — Ambulatory Visit: Payer: Medicare HMO | Attending: Cardiovascular Disease | Admitting: Cardiovascular Disease

## 2023-06-14 VITALS — BP 140/86 | HR 127 | Ht 62.0 in | Wt 165.4 lb

## 2023-06-14 DIAGNOSIS — I480 Paroxysmal atrial fibrillation: Secondary | ICD-10-CM | POA: Diagnosis not present

## 2023-06-14 MED ORDER — APIXABAN 5 MG PO TABS: 5.0000 mg | ORAL_TABLET | Freq: Two times a day (BID) | ORAL | 6 refills | Status: DC

## 2023-06-14 NOTE — Patient Instructions (Signed)
 Medication Instructions:  START Eliquis  5 mg twice daily (start this on 06/28/23) *If you need a refill on your cardiac medications before your next appointment, please call your pharmacy*   Lab Work: CBC and BMET on 06/28/23 - this can be done at any [facility] - no appointment needed and this does not have to be fasting If you have labs (blood work) drawn today and your tests are completely normal, you will receive your results only by: MyChart Message (if you have MyChart) OR A paper copy in the mail If you have any lab test that is abnormal or we need to change your treatment, we will call you to review the results.   Testing/Procedures: Atrial Fibrillation Ablation  Your physician has recommended that you have an ablation. Catheter ablation is a medical procedure used to treat some cardiac arrhythmias (irregular heartbeats). During catheter ablation, a long, thin, flexible tube is put into a blood vessel in your groin (upper thigh), or neck. This tube is called an ablation catheter. It is then guided to your heart through the blood vessel. Radio frequency waves destroy small areas of heart tissue where abnormal heartbeats may cause an arrhythmia to start. Please see the instruction sheet given to you today.  You are scheduled for Atrial Fibrillation Ablation on Monday, February 17 with Dr. Deirdre Furbish.Please arrive at the Main Entrance A at [facility]: [address] [place], [place] [address] at 8:00 am   Follow-Up: At [facility], you and your health needs are our priority.  As part of our continuing mission to provide you with exceptional heart care, we have created designated Provider Care Teams.  These Care Teams include your primary Cardiologist (physician) and Advanced Practice Providers (APPs -  Physician Assistants and Nurse Practitioners) who all work together to provide you with the care you need, when you need it.  We recommend signing up for the patient portal  called MyChart.  Sign up information is provided on this After Visit Summary.  MyChart is used to connect with patients for Virtual Visits (Telemedicine).  Patients are able to view lab/test results, encounter notes, upcoming appointments, etc.  Non-urgent messages can be sent to your provider as well.   To learn more about what you can do with MyChart, go to [email or web address].    Your next appointment:   We will set up a follow up time after your ablation   Provider:   Eulas Furbish, MD

## 2023-06-14 NOTE — Progress Notes (Signed)
  Electrophysiology Office Note:    Date:  06/14/2023   ID:  Amanda [name], DOB Apr 01, 1956, MRN [id number]  PCP:  Oris Camie BRAVO, NP   [facility] Providers Cardiologist:  Redell Shallow, MD     Referring MD: Terra Fairy PARAS, PA-C   History of Present Illness:    Amanda [name] is a 68 y.o. female with a medical history significant for AF, COPD, referred for management of AF.     I discussed the use of AI scribe software for clinical note transcription with the patient, who gave verbal consent to proceed.  The patient, with a history of paroxysmal atrial fibrillation, presents for a consultation regarding recurrent episodes of the condition.  She was diagnosed, to the best of her recollection, and about 2022.  The patient reports experiencing episodes of rapid heartbeat, which she associates with the atrial fibrillation. However, she also notes that she has been able to control her symptoms through distraction and other activities. The patient was unaware that she was in atrial fibrillation during the consultation, suggesting that she may not always be aware of when she is in an episode. The patient also has a history of COPD and is on multiple medications, including those for bipolar disorder.     Today, she feels reasonably well --she does not have any symptoms of atrial fibrillation currently and is surprised that her EKG shows any abnormality.  EKGs/Labs/Other Studies Reviewed Today:     Echocardiogram:  TTE June 2022 EF 60 to 65%.  Normal biatrial size.    Advanced imaging:  Coronary CT May 2022 Coronary calcium score is 0    EKG:         Physical Exam:    VS:  BP (!) 140/86 (BP Location: Left Arm, Patient Position: Sitting, Cuff Size: Normal)   Pulse (!) 127   Ht 5' 2 (1.575 m)   Wt 165 lb 6.4 oz (75 kg)   SpO2 95%   BMI 30.25 kg/m     Wt Readings from Last 3 Encounters:  06/14/23 165 lb 6.4 oz (75 kg)  06/08/23 171 lb (77.6 kg)   05/05/23 169 lb 12.8 oz (77 kg)     GEN: Well nourished, well developed in no acute distress CARDIAC: iRRR, no murmurs, rubs, gallops RESPIRATORY:  Normal work of breathing MUSCULOSKELETAL: no edema    ASSESSMENT & PLAN:     Persistent atrial fibrillation and flutter Often symptomatic with rapid ventricular rates Psychiatric medications limit antiarrhythmic drug use We discussed management options.  She would prefer to remain in sinus rhythm.  We discussed the ablation procedure and she would like to proceed. Will also plan for flutter ablation  We discussed the indication, rationale, logistics, anticipated benefits, and potential risks of the ablation procedure including but not limited to -- bleed at the groin access site, chest pain, damage to nearby organs such as the diaphragm, lungs, or esophagus, need for a drainage tube, or prolonged hospitalization. I explained that the risk for stroke, heart attack, need for open chest surgery, or even death is very low but not zero. she  expressed understanding and wishes to proceed.   Secondary hypercoagulable state CHA2DS2-VASc is 2 for age and female sex Will start anticoagulation a month prior to procedure and plan to discontinue 3 months afterwards   Signed, Eulas BRAVO Furbish, MD  06/14/2023 1:55 PM    [facility]

## 2023-06-15 ENCOUNTER — Encounter: Payer: Self-pay | Admitting: Neurology

## 2023-06-15 LAB — CBC
Hematocrit: 49.2 % — ABNORMAL HIGH (ref 34.0–46.6)
Hemoglobin: 15.5 g/dL (ref 11.1–15.9)
MCH: 26.3 pg — ABNORMAL LOW (ref 26.6–33.0)
MCHC: 31.5 g/dL (ref 31.5–35.7)
MCV: 84 fL (ref 79–97)
Platelets: 419 10*3/uL (ref 150–450)
RBC: 5.89 x10E6/uL — ABNORMAL HIGH (ref 3.77–5.28)
RDW: 13.6 % (ref 11.7–15.4)
WBC: 19.7 10*3/uL — ABNORMAL HIGH (ref 3.4–10.8)

## 2023-06-15 LAB — BASIC METABOLIC PANEL
BUN/Creatinine Ratio: 18 (ref 12–28)
BUN: 21 mg/dL (ref 8–27)
CO2: 30 mmol/L — ABNORMAL HIGH (ref 20–29)
Calcium: 9.7 mg/dL (ref 8.7–10.3)
Chloride: 98 mmol/L (ref 96–106)
Creatinine, Ser: 1.14 mg/dL — ABNORMAL HIGH (ref 0.57–1.00)
Glucose: 107 mg/dL — ABNORMAL HIGH (ref 70–99)
Potassium: 4.6 mmol/L (ref 3.5–5.2)
Sodium: 143 mmol/L (ref 134–144)
eGFR: 53 mL/min/{1.73_m2} — ABNORMAL LOW (ref >59)

## 2023-06-16 ENCOUNTER — Telehealth: Payer: Self-pay

## 2023-06-16 DIAGNOSIS — I480 Paroxysmal atrial fibrillation: Secondary | ICD-10-CM

## 2023-06-16 NOTE — Telephone Encounter (Signed)
 Spoke with patient concerning recent labs, she has an appointment with PCP on Monday, January 13. Patient to have repeat BMET and CBC on 06/30/23. No needs at this time

## 2023-06-16 NOTE — Telephone Encounter (Signed)
-----   Message from Eulas FORBES Furbish sent at 06/16/2023  8:54 AM EST ----- There has been a change in renal function and an elevated WBC count. She will need to follow-up with her PCP regarding these changes. She will need repeat lab testing in 2-3 weeks to assure resolution or stabilization of these lab abnormalities. This may require postponement of her procedure, if necessary.

## 2023-06-17 NOTE — Telephone Encounter (Signed)
-----   Message from Onetha KATHEE Epp sent at 06/16/2023  3:18 PM EST ----- Regarding: FW: slepe eval Hi Camie, you mentioned you would refer this patient to s;leep studies due ot her low oxygen levels overnight on ONO? She is mycharting me today to ask where her referral is. Would you take care of this? Our sleep team will not see her bc she has COPD she needs to get it done in pulmonary or with cardiology or whoever you think is best (sorry I am not a sleep doctor!) etc. Did you send it? Thanks (always something!) THANK YOU!   Wonderful news!!   I will be more than happy to set up the referral and get this started. I received the fax today. Thank you for your efforts and keeping me informed! I appreciate all you do!   SB ----- Message ----- From: Epp Onetha KATHEE, MD Sent: 05/27/2023   7:32 PM EST To: Camie FORBES Doing, NP Subject: RE: slepe eval                                 THANK YOU !!!!!!!!!!!! ----- Message ----- From: Doing Camie FORBES, NP Sent: 05/25/2023   8:02 PM EST To: Onetha KATHEE Epp, MD Subject: RE: slepe eval                                 Wonderful news!!  I will be more than happy to set up the referral and get this started. I received the fax today. Thank you for your efforts and keeping me informed! I appreciate all you do!  SB ----- Message ----- From: Epp Onetha KATHEE, MD Sent: 05/25/2023   2:46 PM EST To: Camie FORBES Doing, NP; [facility] Subject: slepe eval                                     We spoke to patient and she AGREED to sleep study yay! Unfortunately with her COPD we recommend she go to pulmonology for sleep eval and I will fax this ONO to you. WOuld you be able to place a referral to pulmonology for sleep eval?   My nurse heather will get you a copy of the ONO thanks

## 2023-06-18 ENCOUNTER — Other Ambulatory Visit: Payer: Self-pay | Admitting: Nurse Practitioner

## 2023-06-18 DIAGNOSIS — J449 Chronic obstructive pulmonary disease, unspecified: Secondary | ICD-10-CM

## 2023-06-18 DIAGNOSIS — R7981 Abnormal blood-gas level: Secondary | ICD-10-CM

## 2023-06-21 ENCOUNTER — Ambulatory Visit: Payer: Medicare HMO | Admitting: Nurse Practitioner

## 2023-06-23 ENCOUNTER — Encounter: Payer: Self-pay | Admitting: Cardiovascular Disease

## 2023-06-26 ENCOUNTER — Other Ambulatory Visit: Payer: Self-pay | Admitting: Physician Assistant

## 2023-06-28 ENCOUNTER — Ambulatory Visit: Payer: Medicare HMO | Admitting: Physician Assistant

## 2023-07-01 ENCOUNTER — Telehealth: Payer: Self-pay

## 2023-07-01 ENCOUNTER — Ambulatory Visit: Payer: Medicare HMO | Admitting: Physician Assistant

## 2023-07-01 LAB — CBC
Hematocrit: 40.3 % (ref 34.0–46.6)
Hemoglobin: 12.4 g/dL (ref 11.1–15.9)
MCH: 25.7 pg — ABNORMAL LOW (ref 26.6–33.0)
MCHC: 30.8 g/dL — ABNORMAL LOW (ref 31.5–35.7)
MCV: 83 fL (ref 79–97)
Platelets: 267 10*3/uL (ref 150–450)
RBC: 4.83 x10E6/uL (ref 3.77–5.28)
RDW: 14 % (ref 11.7–15.4)
WBC: 8.6 10*3/uL (ref 3.4–10.8)

## 2023-07-01 LAB — BASIC METABOLIC PANEL
BUN/Creatinine Ratio: 18 (ref 12–28)
BUN: 17 mg/dL (ref 8–27)
CO2: 26 mmol/L (ref 20–29)
Calcium: 9 mg/dL (ref 8.7–10.3)
Chloride: 101 mmol/L (ref 96–106)
Creatinine, Ser: 0.93 mg/dL (ref 0.57–1.00)
Glucose: 105 mg/dL — ABNORMAL HIGH (ref 70–99)
Potassium: 4.5 mmol/L (ref 3.5–5.2)
Sodium: 142 mmol/L (ref 134–144)
eGFR: 68 mL/min/{1.73_m2} (ref >59)

## 2023-07-01 LAB — CBC WITH DIFFERENTIAL/PLATELET
Basophils Absolute: 0 10*3/uL (ref 0.0–0.2)
Basos: 0 %
EOS (ABSOLUTE): 0.1 10*3/uL (ref 0.0–0.4)
Eos: 1 %
Hematocrit: 39.8 % (ref 34.0–46.6)
Hemoglobin: 12.3 g/dL (ref 11.1–15.9)
Immature Grans (Abs): 0.1 10*3/uL (ref 0.0–0.1)
Immature Granulocytes: 1 %
Lymphocytes Absolute: 2.9 10*3/uL (ref 0.7–3.1)
Lymphs: 33 %
MCH: 25.7 pg — ABNORMAL LOW (ref 26.6–33.0)
MCHC: 30.9 g/dL — ABNORMAL LOW (ref 31.5–35.7)
MCV: 83 fL (ref 79–97)
Monocytes Absolute: 0.5 10*3/uL (ref 0.1–0.9)
Monocytes: 6 %
Neutrophils Absolute: 5.2 10*3/uL (ref 1.4–7.0)
Neutrophils: 59 %
Platelets: 270 10*3/uL (ref 150–450)
RBC: 4.79 x10E6/uL (ref 3.77–5.28)
RDW: 13.9 % (ref 11.7–15.4)
WBC: 8.9 10*3/uL (ref 3.4–10.8)

## 2023-07-01 LAB — C-REACTIVE PROTEIN: CRP: 11 mg/L — ABNORMAL HIGH (ref 0–10)

## 2023-07-01 LAB — URIC ACID: Uric Acid: 5.2 mg/dL (ref 3.0–7.2)

## 2023-07-01 LAB — SEDIMENTATION RATE: Sed Rate: 49 mm/h — ABNORMAL HIGH (ref 0–40)

## 2023-07-01 NOTE — Telephone Encounter (Signed)
Attempted to contact patient, left message to recent lab work had improved and reminded patient of imaging appointment tomorrow (CT) - DPR on file.

## 2023-07-01 NOTE — Telephone Encounter (Signed)
Spoke with patient and reviewed CT instruction letter - no questions at this time

## 2023-07-02 ENCOUNTER — Ambulatory Visit (HOSPITAL_COMMUNITY)
Admission: RE | Admit: 2023-07-02 | Discharge: 2023-07-02 | Disposition: A | Discharge status: Home / Self Care | Payer: Medicare HMO | Source: Ambulatory Visit | Attending: Cardiovascular Disease | Admitting: Cardiovascular Disease

## 2023-07-02 ENCOUNTER — Encounter: Payer: Self-pay | Admitting: Podiatry

## 2023-07-02 DIAGNOSIS — I480 Paroxysmal atrial fibrillation: Secondary | ICD-10-CM | POA: Diagnosis present

## 2023-07-02 MED ORDER — IOHEXOL 350 MG/ML SOLN
95.0000 mL | Freq: Once | INTRAVENOUS | Status: AC | PRN
  Administered 2023-07-02: 95 mL via INTRAVENOUS

## 2023-07-03 ENCOUNTER — Encounter: Payer: Self-pay | Admitting: Cardiovascular Disease

## 2023-07-19 ENCOUNTER — Telehealth (HOSPITAL_COMMUNITY): Payer: Self-pay

## 2023-07-19 NOTE — Telephone Encounter (Signed)
 Attempted to reach patient to discuss upcoming procedure, no answer. Left VM on both contact numbers for patient to return call.

## 2023-07-19 NOTE — Telephone Encounter (Signed)
 Call placed to patient to discuss upcoming procedure.   CT: completed and acceptable.  Labs: completed and acceptable.   Any recent signs of acute illness or been started on antibiotics? NO  Diabetic medications to hold? NO Any missed doses of blood thinner?   Advised patient to continue taking ANTICOAGULANT: Eliquis  (Apixaban ) without missing any doses. Hold all medications on the morning of procedure, including Eliquis  or the procedure may be rescheduled.  Confirmed patient is scheduled for Atrial Fibrillation Ablation on Monday, February 17 with Dr. Spence Dux. Instructed patient to arrive at the Main Entrance A at [facility]: [address] [place], [place] [address] and check in at Admitting at 8:00 AM  Advised of plan to go home the same day and will only stay overnight if medically necessary. You MUST have a responsible adult to drive you home and MUST be with you the first 24 hours after you arrive home or your procedure could be cancelled.  Patient verbalized understanding to all instructions provided and agreed to proceed with procedure.

## 2023-07-20 ENCOUNTER — Ambulatory Visit (HOSPITAL_COMMUNITY): Payer: Medicare HMO | Admitting: Internal Medicine

## 2023-07-21 ENCOUNTER — Ambulatory Visit: Payer: Medicare HMO | Admitting: Physician Assistant

## 2023-07-23 NOTE — Pre-Procedure Instructions (Signed)
Instructed patient on the following items: Arrival time 0800 Nothing to eat or drink after midnight No meds AM of procedure Responsible person to drive you home and stay with you for 24 hrs  Have you missed any doses of anti-coagulant Elquis- takes twice a day, hasn't missed any doses.  Don't take dose on Monday morning.

## 2023-07-26 ENCOUNTER — Other Ambulatory Visit: Payer: Self-pay

## 2023-07-26 ENCOUNTER — Ambulatory Visit (HOSPITAL_COMMUNITY)
Admission: RE | Admit: 2023-07-26 | Discharge: 2023-07-26 | Disposition: A | Discharge status: Home / Self Care | Payer: Medicare HMO | Attending: Cardiovascular Disease | Admitting: Cardiovascular Disease

## 2023-07-26 ENCOUNTER — Encounter (HOSPITAL_COMMUNITY): Payer: Self-pay | Admitting: Cardiovascular Disease

## 2023-07-26 ENCOUNTER — Ambulatory Visit (HOSPITAL_COMMUNITY): Payer: Medicare HMO | Admitting: Certified Registered Nurse Anesthetist

## 2023-07-26 ENCOUNTER — Ambulatory Visit (HOSPITAL_BASED_OUTPATIENT_CLINIC_OR_DEPARTMENT_OTHER): Payer: Medicare HMO | Admitting: Certified Registered Nurse Anesthetist

## 2023-07-26 ENCOUNTER — Encounter (HOSPITAL_COMMUNITY)
Admission: RE | Disposition: A | Discharge status: Home / Self Care | Payer: Medicare HMO | Source: Home / Self Care | Attending: Cardiovascular Disease

## 2023-07-26 DIAGNOSIS — D6869 Other thrombophilia: Secondary | ICD-10-CM | POA: Diagnosis not present

## 2023-07-26 DIAGNOSIS — I4891 Unspecified atrial fibrillation: Secondary | ICD-10-CM | POA: Diagnosis not present

## 2023-07-26 DIAGNOSIS — Z79899 Other long term (current) drug therapy: Secondary | ICD-10-CM | POA: Insufficient documentation

## 2023-07-26 DIAGNOSIS — I483 Typical atrial flutter: Secondary | ICD-10-CM | POA: Diagnosis not present

## 2023-07-26 DIAGNOSIS — Z7901 Long term (current) use of anticoagulants: Secondary | ICD-10-CM | POA: Insufficient documentation

## 2023-07-26 DIAGNOSIS — I4892 Unspecified atrial flutter: Secondary | ICD-10-CM | POA: Diagnosis not present

## 2023-07-26 DIAGNOSIS — F319 Bipolar disorder, unspecified: Secondary | ICD-10-CM | POA: Insufficient documentation

## 2023-07-26 DIAGNOSIS — J449 Chronic obstructive pulmonary disease, unspecified: Secondary | ICD-10-CM | POA: Diagnosis not present

## 2023-07-26 DIAGNOSIS — I4819 Other persistent atrial fibrillation: Secondary | ICD-10-CM | POA: Diagnosis present

## 2023-07-26 DIAGNOSIS — I10 Essential (primary) hypertension: Secondary | ICD-10-CM | POA: Diagnosis not present

## 2023-07-26 HISTORY — PX: ATRIAL FIBRILLATION ABLATION: EP1191

## 2023-07-26 LAB — POCT ACTIVATED CLOTTING TIME: Activated Clotting Time: 394 s

## 2023-07-26 SURGERY — ATRIAL FIBRILLATION ABLATION
Anesthesia: General

## 2023-07-26 MED ORDER — PROPOFOL 10 MG/ML IV BOLUS
INTRAVENOUS | Status: DC | PRN
  Administered 2023-07-26: 150 mg via INTRAVENOUS

## 2023-07-26 MED ORDER — ACETAMINOPHEN 325 MG PO TABS: 650.0000 mg | ORAL_TABLET | ORAL | Status: DC | PRN

## 2023-07-26 MED ORDER — DEXAMETHASONE SODIUM PHOSPHATE 10 MG/ML IJ SOLN
INTRAMUSCULAR | Status: DC | PRN
  Administered 2023-07-26: 10 mg via INTRAVENOUS

## 2023-07-26 MED ORDER — SUGAMMADEX SODIUM 200 MG/2ML IV SOLN
INTRAVENOUS | Status: DC | PRN
  Administered 2023-07-26: 200 mg via INTRAVENOUS

## 2023-07-26 MED ORDER — PROTAMINE SULFATE 10 MG/ML IV SOLN
INTRAVENOUS | Status: DC | PRN
  Administered 2023-07-26: 20 mg via INTRAVENOUS

## 2023-07-26 MED ORDER — SODIUM CHLORIDE 0.9 % IV SOLN: INTRAVENOUS | Status: DC

## 2023-07-26 MED ORDER — ROCURONIUM BROMIDE 10 MG/ML (PF) SYRINGE
PREFILLED_SYRINGE | INTRAVENOUS | Status: DC | PRN
  Administered 2023-07-26: 60 mg via INTRAVENOUS
  Administered 2023-07-26: 40 mg via INTRAVENOUS

## 2023-07-26 MED ORDER — ATROPINE SULFATE 1 MG/ML IV SOLN
INTRAVENOUS | Status: DC | PRN
  Administered 2023-07-26: 1 mg via INTRAVENOUS

## 2023-07-26 MED ORDER — ONDANSETRON HCL 4 MG/2ML IJ SOLN
INTRAMUSCULAR | Status: DC | PRN
  Administered 2023-07-26: 4 mg via INTRAVENOUS

## 2023-07-26 MED ORDER — FENTANYL CITRATE (PF) 250 MCG/5ML IJ SOLN
INTRAMUSCULAR | Status: DC | PRN
  Administered 2023-07-26 (×2): 50 ug via INTRAVENOUS

## 2023-07-26 MED ORDER — FENTANYL CITRATE (PF) 100 MCG/2ML IJ SOLN
INTRAMUSCULAR | Status: AC
  Filled 2023-07-26: qty 2

## 2023-07-26 MED ORDER — HEPARIN SODIUM (PORCINE) 1000 UNIT/ML IJ SOLN
INTRAMUSCULAR | Status: DC | PRN
Start: 2023-07-26 — End: 2023-07-26
  Administered 2023-07-26: 14000 [IU] via INTRAVENOUS

## 2023-07-26 MED ORDER — SODIUM CHLORIDE 0.9% FLUSH
3.0000 mL | INTRAVENOUS | Status: DC | PRN
Start: 2023-07-26 — End: 2023-07-26

## 2023-07-26 MED ORDER — SODIUM CHLORIDE 0.9 % IV SOLN: 250.0000 mL | INTRAVENOUS | Status: DC | PRN

## 2023-07-26 MED ORDER — HEPARIN (PORCINE) IN NACL 1000-0.9 UT/500ML-% IV SOLN
INTRAVENOUS | Status: DC | PRN
  Administered 2023-07-26 (×3): 500 mL

## 2023-07-26 MED ORDER — LIDOCAINE 2% (20 MG/ML) 5 ML SYRINGE
INTRAMUSCULAR | Status: DC | PRN
  Administered 2023-07-26: 2.5 mL via INTRAVENOUS

## 2023-07-26 MED ORDER — ONDANSETRON HCL 4 MG/2ML IJ SOLN: 4.0000 mg | Freq: Four times a day (QID) | INTRAMUSCULAR | Status: DC | PRN

## 2023-07-26 SURGICAL SUPPLY — 23 items
BAG SNAP BAND KOVER 36X36 (MISCELLANEOUS) IMPLANT
BLANKET WARM UNDERBOD FULL ACC (MISCELLANEOUS) ×1 IMPLANT
CABLE PFA RX CATH CONN (CABLE) IMPLANT
CATH ABLAT QDOT MICRO BI TC DF (CATHETERS) IMPLANT
CATH FARAWAVE ABLATION 31 (CATHETERS) IMPLANT
CATH OCTARAY 2.0 F 3-3-3-3-3 (CATHETERS) IMPLANT
CATH SOUNDSTAR ECO 8FR (CATHETERS) IMPLANT
CATH WEBSTER BI DIR CS D-F CRV (CATHETERS) IMPLANT
CLOSURE PERCLOSE PROSTYLE (VASCULAR PRODUCTS) IMPLANT
COVER SWIFTLINK CONNECTOR (BAG) ×1 IMPLANT
DEVICE CLOSURE MYNXGRIP 6/7F (Vascular Products) IMPLANT
DILATOR VESSEL 38 20CM 16FR (INTRODUCER) IMPLANT
GUIDEWIRE INQWIRE 1.5J.035X260 (WIRE) IMPLANT
INQWIRE 1.5J .035X260CM (WIRE) ×1
KIT VERSACROSS CNCT FARADRIVE (KITS) IMPLANT
PACK EP LF (CUSTOM PROCEDURE TRAY) ×1 IMPLANT
PAD DEFIB RADIO PHYSIO CONN (PAD) ×1 IMPLANT
PATCH CARTO3 (PAD) IMPLANT
SHEATH FARADRIVE STEERABLE (SHEATH) IMPLANT
SHEATH PINNACLE 8F 10CM (SHEATH) IMPLANT
SHEATH PINNACLE 9F 10CM (SHEATH) IMPLANT
SHEATH PROBE COVER 6X72 (BAG) IMPLANT
TUBING SMART ABLATE COOLFLOW (TUBING) IMPLANT

## 2023-07-26 NOTE — Anesthesia Procedure Notes (Signed)
Procedure Name: Intubation Date/Time: 07/26/2023 10:18 AM  Performed by: Hali Marry, CRNAPre-anesthesia Checklist: Patient identified, Emergency Drugs available, Suction available and Patient being monitored Patient Re-evaluated:Patient Re-evaluated prior to induction Oxygen Delivery Method: Circle system utilized Preoxygenation: Pre-oxygenation with 100% oxygen Induction Type: IV induction Ventilation: Mask ventilation without difficulty Laryngoscope Size: Mac and 3 Grade View: Grade I Tube type: Oral Tube size: 6.5 mm Number of attempts: 1 Airway Equipment and Method: Stylet and Oral airway Placement Confirmation: ETT inserted through vocal cords under direct vision, positive ETCO2 and breath sounds checked- equal and bilateral Secured at: 22 cm Tube secured with: Tape Dental Injury: Teeth and Oropharynx as per pre-operative assessment

## 2023-07-26 NOTE — Progress Notes (Signed)
 Patient walked to the bathroom without difficulties. Bilateral groins level 0, clean, dry, and intact.

## 2023-07-26 NOTE — Anesthesia Postprocedure Evaluation (Signed)
Anesthesia Post Note  Patient: Amanda [name]  Procedure(s) Performed: ATRIAL FIBRILLATION ABLATION     Patient location during evaluation: PACU Anesthesia Type: General Level of consciousness: awake and alert Pain management: pain level controlled Vital Signs Assessment: post-procedure vital signs reviewed and stable Respiratory status: spontaneous breathing, nonlabored ventilation, respiratory function stable and patient connected to nasal cannula oxygen Cardiovascular status: blood pressure returned to baseline and stable Postop Assessment: no apparent nausea or vomiting Anesthetic complications: no   No notable events documented.  Last Vitals:  Vitals:   07/26/23 1400 07/26/23 1430  BP: 122/67 136/64  Pulse: 64 63  Resp:  15  Temp:    SpO2: 91% 92%    Last Pain:  Vitals:   07/26/23 1247  TempSrc:   PainSc: 0-No pain                 Earl Lites P Menachem Urbanek

## 2023-07-26 NOTE — Discharge Instructions (Signed)

## 2023-07-26 NOTE — Transfer of Care (Signed)
Immediate Anesthesia Transfer of Care Note  Patient: Amanda [name]  Procedure(s) Performed: ATRIAL FIBRILLATION ABLATION  Patient Location: PACU  Anesthesia Type:General  Level of Consciousness: awake, alert , and oriented  Airway & Oxygen Therapy: Patient Spontanous Breathing and Patient connected to face mask oxygen  Post-op Assessment: Report given to RN and Post -op Vital signs reviewed and stable  Post vital signs: Reviewed and stable  Last Vitals:  Vitals Value Taken Time  BP    Temp    Pulse    Resp    SpO2      Last Pain:  Vitals:   07/26/23 0852  TempSrc:   PainSc: 0-No pain         Complications: No notable events documented.

## 2023-07-26 NOTE — Anesthesia Preprocedure Evaluation (Signed)
Anesthesia Evaluation  Patient identified by MRN, date of birth, ID band Patient awake    Reviewed: Allergy & Precautions, NPO status , Patient's Chart, lab work & pertinent test results  Airway Mallampati: II  TM Distance: >3 FB Neck ROM: Full    Dental no notable dental hx.    Pulmonary COPD,  COPD inhaler, Current Smoker and Patient abstained from smoking.   Pulmonary exam normal        Cardiovascular hypertension, Pt. on medications + dysrhythmias Atrial Fibrillation  Rhythm:Irregular Rate:Normal     Neuro/Psych  Headaches  Anxiety Depression Bipolar Disorder      GI/Hepatic Neg liver ROS,GERD  Medicated,,  Endo/Other  Hypothyroidism    Renal/GU negative Renal ROS  negative genitourinary   Musculoskeletal  (+) Arthritis , Osteoarthritis,    Abdominal Normal abdominal exam  (+)   Peds  Hematology negative hematology ROS (+)   Anesthesia Other Findings   Reproductive/Obstetrics                             Anesthesia Physical Anesthesia Plan  ASA: 3  Anesthesia Plan: General   Post-op Pain Management:    Induction: Intravenous  PONV Risk Score and Plan: 2 and Ondansetron, Dexamethasone, Midazolam and Treatment may vary due to age or medical condition  Airway Management Planned: Mask and Oral ETT  Additional Equipment: None  Intra-op Plan:   Post-operative Plan: Extubation in OR  Informed Consent: I have reviewed the patients History and Physical, chart, labs and discussed the procedure including the risks, benefits and alternatives for the proposed anesthesia with the patient or authorized representative who has indicated his/her understanding and acceptance.     Dental advisory given  Plan Discussed with: CRNA  Anesthesia Plan Comments:        Anesthesia Quick Evaluation

## 2023-07-26 NOTE — H&P (Signed)
Electrophysiology Office Note:    Date:  07/26/2023   ID:  Amanda [name], DOB 10-16-1955, MRN [id number]  PCP:  Tollie Eth, NP   [facility] Providers Cardiologist:  Olga Millers, MD Electrophysiologist:  Maurice Small, MD     Referring MD: Maurice Small, MD   History of Present Illness:    Amanda [name] is a 68 y.o. female with a medical history significant for AF, COPD, referred for management of AF.     I discussed the use of AI scribe software for clinical note transcription with the patient, who gave verbal consent to proceed.  The patient, with a history of paroxysmal atrial fibrillation, presents for a consultation regarding recurrent episodes of the condition.  She was diagnosed, to the best of her recollection, and about 2022.  The patient reports experiencing episodes of rapid heartbeat, which she associates with the atrial fibrillation. However, she also notes that she has been able to control her symptoms through distraction and other activities. The patient was unaware that she was in atrial fibrillation during the consultation, suggesting that she may not always be aware of when she is in an episode. The patient also has a history of COPD and is on multiple medications, including those for bipolar disorder.     Today, she feels reasonably well . She does have a headache, which is normal for her.  I reviewed the patient's CT and labs. There was no LAA thrombus. she  has not missed any doses of anticoagulation, and she took her dose last night. There have been no changes in the patient's diagnoses, medications, or condition since our recent clinic visit.   EKGs/Labs/Other Studies Reviewed Today:     Echocardiogram:  TTE June 2022 EF 60 to 65%.  Normal biatrial size.    Advanced imaging:  Coronary CT May 2022 Coronary calcium score is 0    EKG:         Physical Exam:    VS:  BP (!) 125/49   Pulse (!) 57   Temp 99 F  (37.2 C) (Oral)   Resp 17   Ht 5\' 2"  (1.575 m)   Wt 74.8 kg   SpO2 (!) 87% Comment: pt states she has copd  BMI 30.18 kg/m     Wt Readings from Last 3 Encounters:  07/26/23 74.8 kg  06/14/23 75 kg  06/08/23 77.6 kg     GEN: Well nourished, well developed in no acute distress CARDIAC: RRR, no murmurs, rubs, gallops RESPIRATORY:  Normal work of breathing MUSCULOSKELETAL: no edema    ASSESSMENT & PLAN:     Persistent atrial fibrillation and flutter Often symptomatic with rapid ventricular rates Psychiatric medications limit antiarrhythmic drug use We discussed management options.  She would prefer to remain in sinus rhythm.  We discussed the ablation procedure and she would like to proceed. Will also plan for flutter ablation  We discussed the indication, rationale, logistics, anticipated benefits, and potential risks of the ablation procedure including but not limited to -- bleed at the groin access site, chest pain, damage to nearby organs such as the diaphragm, lungs, or esophagus, need for a drainage tube, or prolonged hospitalization. I explained that the risk for stroke, heart attack, need for open chest surgery, or even death is very low but not zero. she  expressed understanding and wishes to proceed.   Secondary hypercoagulable state CHA2DS2-VASc is 2 for age and female sex continue eliquis for at least 3  months after ablation   Signed, Maurice Small, MD  07/26/2023 9:44 AM    [facility]

## 2023-07-27 ENCOUNTER — Telehealth (HOSPITAL_COMMUNITY): Payer: Self-pay

## 2023-07-27 NOTE — Telephone Encounter (Signed)
Spoke with patient to complete post procedure follow up call.  Patient reports no complications with groin sites.   Instructions reviewed with patient:  Remove large bandage at puncture site after 24 hours. It is normal to have bruising, tenderness and a pea or marble sized lump/knot at the groin site which can take up to three months to resolve.  Get help right away if you notice sudden swelling at the puncture site.  Check your puncture site every day for signs of infection: fever, redness, swelling, pus drainage, warmth, foul odor or excessive pain. If this occurs, please call the office at [phone], to speak with the nurse. Get help right away if your puncture site is bleeding and the bleeding does not stop after applying firm pressure to the area.  You may continue to have skipped beats/ atrial fibrillation during the first several months after your procedure.  It is very important not to miss any doses of your blood thinner Eliquis. Patient restarted taking this medication on yesterday, 07/26/23.   You will follow up with the Afib clinic 08/23/23 and follow up with the APP on 10/25/23.   Patient verbalized understanding to all instructions provided.

## 2023-07-29 ENCOUNTER — Encounter: Payer: Self-pay | Admitting: Cardiology

## 2023-07-29 NOTE — Telephone Encounter (Addendum)
Received a call from patient concerned about possible swelling at left groin site s/p Afib ablation on 07/27/23. She describes the area as a "peanut size". Reports no changes in size since she removed bandage on yesterday. She denies any pain, bleeding, drainage, or signs of infection. Advised patient that it does not sound like any cause for concern but continue to monitor and to contact office if notice any increase in swelling or additional symptoms previously mentioned. She voiced understanding. Will inform Dr. Nelly Laurence and inquire if any additional recommendations.   Per Dr. Nelly Laurence, A peanut sized knot is normal.

## 2023-08-01 MED FILL — Fentanyl Citrate Preservative Free (PF) Inj 100 MCG/2ML: INTRAMUSCULAR | Qty: 2 | Status: AC

## 2023-08-03 ENCOUNTER — Ambulatory Visit (HOSPITAL_COMMUNITY): Payer: Medicare HMO | Admitting: Internal Medicine

## 2023-08-04 ENCOUNTER — Encounter: Payer: Self-pay | Admitting: Cardiovascular Disease

## 2023-08-04 NOTE — Telephone Encounter (Signed)
 Pt says she has not noticed any bleeding or drainage since her ablation 07/26/23... she noticed this evening a little amount of bloody drainage on her underwear.... she held light pressure and has not noticed any more since... there is a dime size know under the skin.Marland Kitchen no edema, no redness, no pain.   I have advised her to monitor it... keep it clean and dry.... not to disrupt any scab formation.   I will send to Dr Nelly Laurence and the Afib clinic for review.

## 2023-08-05 ENCOUNTER — Encounter: Payer: Self-pay | Admitting: Emergency Medicine

## 2023-08-05 NOTE — Telephone Encounter (Signed)
 Spoke with patient, denies any further bleeding. Patient states her groin sites are both doing well, just concerned about the scant amount of blood she noticed yesterday. No further needs at this time

## 2023-08-10 ENCOUNTER — Ambulatory Visit (INDEPENDENT_AMBULATORY_CARE_PROVIDER_SITE_OTHER): Payer: Medicare HMO | Admitting: Neurology

## 2023-08-10 VITALS — BP 127/56 | HR 65 | Ht 64.0 in | Wt 168.0 lb

## 2023-08-10 DIAGNOSIS — G43711 Chronic migraine without aura, intractable, with status migrainosus: Secondary | ICD-10-CM | POA: Diagnosis not present

## 2023-08-10 NOTE — Progress Notes (Unsigned)
 [facility]    Provider:  Dr Lucia Gaskins Requesting Provider: Early, Sung Amabile, NP Primary Care Provider:  Tollie Eth, NP  CC:  Migraines  Ajovy helped tremendously. She can't afford the medication. We could not get it approved. She has been suffering since the Ajovy samples ran out. When on the Ajovy she improved by significantly she can;t live without a cgrp. Insurance covers [employer] we will try and get that approved. Now back to daily migraines and daily headaches. She has not been to get injections into the neck due to her afib s/p procedure. Hilarious funny patient stopped smoking was 3 packs a day.   HPI 03/24/2023:  Amanda [name] is a 68 y.o. female here as requested by Early, Sung Amabile, NP for migraines.has Bipolar disorder (HCC); Gastroesophageal reflux disease; Osteoarthritis; Atrial fibrillation (HCC); Acute bronchitis with COPD (HCC); Back pain; Carpal tunnel syndrome; Chronic pain; Hyperlipidemia, unspecified; Hypertension; Hypothyroid; Nicotine dependence; Other long term (current) drug therapy; Prediabetes; Vitamin B12 deficiency (non anemic); Pruritic disorder; Numerous skin moles; Annular dermatitis; Anxiety; Intractable ophthalmoplegic migraine; Easy bruising; Chronic migraine without aura, with intractable migraine, so stated, with status migrainosus; Leg swelling; Slow transit constipation; Diverticulosis; Fatty infiltration of liver; Fatty pancreas; and Calcification of abdominal aorta (HCC) on their problem list.  Has a long history of migraines. Has been to Dr. Meryl Crutch many years ago, [facility] Neale Burly and multiple other physicians. She is taking excedrin migraine daily and we discussed medication overuse. The zonisamide did not help that Dr. Neale Burly tried but she has tried a plethora of medications over the decades. 16 severe migraines a mnth, 8 moderately severe headaches a month and 4 moderately severe migraines a month and 4 mild headaches  a month. Pulsating and throbbing on the right, the parietal area, light and sound sensitivity, nausea, no vomiting. Zofran helps with the nausea. Feels like it rolling in the head messing with her. She has ahd migraines all her lfe. She doe snot wake up with them. Wakes up fine. But she  has COPD. She sits up in recliner to sleep. Migraines ongoing for years. Affecting her life. She cannot afford the newer medications we will try her on samples and see if we can get her patient assistance. No other focal neurologic deficits, associated symptoms, inciting events or modifiable factors.   Reviewed notes, labs and imaging from outside physicians, which showed:  MRI brain:  CLINICAL DATA:  68 year old female with constant headaches since October 2023.   EXAM: MRI HEAD WITHOUT CONTRAST   TECHNIQUE: Multiplanar, multiecho pulse sequences of the brain and surrounding structures were obtained without intravenous contrast.   COMPARISON:  None Available.   FINDINGS: Brain: Cerebral volume is within normal limits for age. No restricted diffusion to suggest acute infarction. No midline shift, mass effect, evidence of mass lesion, ventriculomegaly, extra-axial collection or acute intracranial hemorrhage. Cervicomedullary junction and pituitary are within normal limits. Wallace Cullens and white matter signal is within normal limits for age throughout the brain. No encephalomalacia or chronic cerebral blood products identified. Deep gray nuclei, brainstem, and cerebellum appear normal.   Vascular: Major intracranial vascular flow voids are preserved. The distal right vertebral artery appears dominant, normal variant.   Skull and upper cervical spine: Some cervical spine disc degeneration is evident, but overall within normal limits for age. Visualized bone marrow signal is within normal limits.   Sinuses/Orbits: Orbits appear symmetric and normal. Paranasal sinuses and mastoids are well aerated.    Other: Grossly  normal visible internal auditory structures. Negative visible scalp and face.   IMPRESSION: No acute intracranial abnormality and normal for age noncontrast MRI appearance of the Brain.     Electronically Signed   By: Odessa Fleming M.D.   On: 06/25/2022 13:50     Latest Ref Rng & Units 06/30/2023    3:45 PM 06/30/2023    3:43 PM 06/14/2023    2:58 PM  CBC  WBC 3.4 - 10.8 x10E3/uL 8.6  8.9  19.7   Hemoglobin 11.1 - 15.9 g/dL 16.1  09.6  04.5   Hematocrit 34.0 - 46.6 % 40.3  39.8  49.2   Platelets 150 - 450 x10E3/uL 267  270  419       Latest Ref Rng & Units 06/30/2023    3:46 PM 06/14/2023    3:02 PM 12/07/2022    3:59 PM  CMP  Glucose 70 - 99 mg/dL 409  811  914   BUN 8 - 27 mg/dL 17  21  16    Creatinine 0.57 - 1.00 mg/dL 7.82  9.56  2.13   Sodium 134 - 144 mmol/L 142  143  145   Potassium 3.5 - 5.2 mmol/L 4.5  4.6  4.7   Chloride 96 - 106 mmol/L 101  98  104   CO2 20 - 29 mmol/L 26  30  27    Calcium 8.7 - 10.3 mg/dL 9.0  9.7  9.4   Total Protein 6.0 - 8.5 g/dL   7.0   Total Bilirubin 0.0 - 1.2 mg/dL   <0.8   Alkaline Phos 44 - 121 IU/L   85   AST 0 - 40 IU/L   16   ALT 0 - 32 IU/L   11      Review of Systems: Patient complains of symptoms per HPI as well as the following symptoms none. Pertinent negatives and positives per HPI. All others negative.   Social History   Socioeconomic History   Marital status: Married    Spouse name: Jillyn Hidden   Number of children: 1   Years of education: Not on file   Highest education level: Not on file  Occupational History   Not on file  Tobacco Use   Smoking status: Every Day    Current packs/day: 0.50    Average packs/day: 0.5 packs/day for 45.0 years (22.5 ttl pk-yrs)    Types: Cigarettes   Smokeless tobacco: Never   Tobacco comments:    Current smoker 1ppd 05/05/23  Vaping Use   Vaping status: Never Used  Substance and Sexual Activity   Alcohol use: No   Drug use: Yes    Frequency: 7.0 times per week     Types: Marijuana    Comment: daily marijuana use   Sexual activity: Not Currently    Partners: Male  Other Topics Concern   Not on file  Social History Narrative   Not on file   Social Drivers of Health   Financial Resource Strain: Low Risk  (11/11/2022)   Received from [facility], [facility]   Overall Financial Resource Strain (CARDIA)    Difficulty of Paying Living Expenses: Not hard at all  Food Insecurity: No Food Insecurity (11/11/2022)   Received from [facility], [facility]   Hunger Vital Sign    Worried About Running Out of Food in the Last Year: Never true    Ran Out of Food in the Last Year: Never true  Transportation Needs: No Transportation Needs (11/11/2022)  Received from [employer], [facility]   [facility] - Transportation    Lack of Transportation (Medical): No    Lack of Transportation (Non-Medical): No  Physical Activity: Unknown (11/11/2022)   Received from [facility], [facility]   Exercise Vital Sign    Days of Exercise per Week: 0 days    Minutes of Exercise per Session: Not on file  Recent Concern: Physical Activity - Inactive (09/01/2022)   Exercise Vital Sign    Days of Exercise per Week: 0 days    Minutes of Exercise per Session: 0 min  Stress: No Stress Concern Present (11/11/2022)   Received from [facility], [facility] - Occupational Stress Questionnaire    Feeling of Stress : Not at all  Social Connections: Socially Integrated (11/11/2022)   Received from [facility], [facility]   Social Network    How would you rate your social network (family, work, friends)?: Good participation with social networks  Intimate Partner Violence: Not At Risk (06/05/2023)   Received from [facility]   HITS    Over the last 12 months how often did your partner physically hurt you?: Never    Over the last 12 months how often did your partner insult you or talk down to you?: Never    Over the last 12  months how often did your partner threaten you with physical harm?: Never    Over the last 12 months how often did your partner scream or curse at you?: Never    Family History  Problem Relation Age of Onset   Dementia Mother    Congestive Heart Failure Father    Migraines Father    Dementia Maternal Aunt    Dementia Maternal Aunt    Dementia Maternal Grandmother     Past Medical History:  Diagnosis Date   Atrial fibrillation (HCC)    DEGENERATIVE JOINT DISEASE 11/12/2006   DISORDER, BIPOLAR NOS 02/28/2007   GERD 11/12/2006   Major depressive disorder, single episode, unspecified 07/15/2021    Patient Active Problem List   Diagnosis Date Noted   Leg swelling 06/08/2023   Slow transit constipation 06/08/2023   Diverticulosis 06/08/2023   Fatty infiltration of liver 06/08/2023   Fatty pancreas 06/08/2023   Calcification of abdominal aorta (HCC) 06/08/2023   Chronic migraine without aura, with intractable migraine, so stated, with status migrainosus 03/24/2023   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   Easy bruising 01/17/2023   Anxiety 11/26/2022   Annular dermatitis 06/07/2022   Pruritic disorder 07/17/2021   Numerous skin moles 07/17/2021   Acute bronchitis with COPD (HCC) 07/15/2021   Back pain 07/15/2021   Carpal tunnel syndrome 07/15/2021   Chronic pain 07/15/2021   Hyperlipidemia, unspecified 07/15/2021   Hypertension 07/15/2021   Hypothyroid 07/15/2021   Nicotine dependence 07/15/2021   Other long term (current) drug therapy 07/15/2021   Vitamin B12 deficiency (non anemic) 07/15/2021   Prediabetes 04/24/2019   Atrial fibrillation (HCC) 07/03/2017   Bipolar disorder (HCC) 02/28/2007   Gastroesophageal reflux disease 11/12/2006   Osteoarthritis 11/12/2006    Past Surgical History:  Procedure Laterality Date   ABDOMINAL HYSTERECTOMY     ATRIAL FIBRILLATION ABLATION N/A 07/26/2023   Procedure: ATRIAL FIBRILLATION ABLATION;  Surgeon: Maurice Small, MD;  Location: [facility];  Service: Cardiovascular;  Laterality: N/A;   BREAST SURGERY     reduction   KNEE ARTHROSCOPY     TONSILLECTOMY  Current Outpatient Medications  Medication Sig Dispense Refill   albuterol (VENTOLIN HFA) 108 (90 Base) MCG/ACT inhaler Inhale 2 puffs into the lungs every 6 (six) hours as needed for shortness of breath or wheezing.     apixaban (ELIQUIS) 5 MG TABS tablet Take 1 tablet (5 mg total) by mouth 2 (two) times daily. 60 tablet 6   aspirin-acetaminophen-caffeine (EXCEDRIN MIGRAINE) 250-250-65 MG tablet Take 2 tablets by mouth every 6 (six) hours as needed for headache or migraine.     baclofen (LIORESAL) 10 MG tablet Take 10 mg by mouth in the morning.     Buprenorphine HCl-Naloxone HCl 1.4-0.36 MG SUBL Place 1-2 tablets under the tongue See admin instructions. Take 2 tablets by mouth in the morning and take 1 tablet in afternoon     ciclopirox (PENLAC) 8 % solution Apply topically at bedtime. Apply over nail and surrounding skin. Apply daily over previous coat. After seven (7) days, may remove with alcohol and continue cycle. 6.6 mL 2   citalopram (CELEXA) 20 MG tablet TAKE 1 TABLET AT BEDTIME 90 tablet 1   diltiazem (CARDIZEM CD) 120 MG 24 hr capsule TAKE 1 CAPSULE EVERY DAY (NEED MD APPOINTMENT FOR REFILLS) (Patient taking differently: Take 120 mg by mouth at bedtime.) 90 capsule 3   diltiazem (CARDIZEM) 30 MG tablet Take 1 tablet every 4 hours AS NEEDED for heart rate >90 30 tablet 1   ezetimibe (ZETIA) 10 MG tablet Take 10 mg by mouth at bedtime.     Fremanezumab-vfrm (AJOVY) 225 MG/1.5ML SOAJ Inject 225 mg into the skin every 30 (thirty) days.     Fremanezumab-vfrm (AJOVY) 225 MG/1.5ML SOAJ Inject 225 mg into the skin every 30 (thirty) days. 1.5 mL 11   furosemide (LASIX) 20 MG tablet Take 1 tablet (20 mg total) by mouth daily as needed for edema. 30 tablet 3   gabapentin (NEURONTIN) 100 MG capsule Take 1 capsule (100 mg total) by mouth at  bedtime. 90 capsule 3   lamoTRIgine (LAMICTAL) 100 MG tablet TAKE 1 TABLET EVERY DAY (Patient taking differently: Take 100 mg by mouth at bedtime.) 90 tablet 1   lansoprazole (PREVACID) 30 MG capsule Take 30 mg by mouth at bedtime.     nicotine (NICODERM CQ - DOSED IN MG/24 HOURS) 21 mg/24hr patch Place 21 mg onto the skin daily.     ondansetron (ZOFRAN-ODT) 4 MG disintegrating tablet Take 4 mg by mouth every 8 (eight) hours as needed for vomiting or nausea.     risperiDONE (RISPERDAL) 1 MG tablet TAKE 1 TABLET AT BEDTIME 90 tablet 0   Vitamin D, Ergocalciferol, (DRISDOL) 1.25 MG (50000 UNIT) CAPS capsule Take 1 capsule (50,000 Units total) by mouth every 7 (seven) days. (Patient taking differently: Take 50,000 Units by mouth every Friday.) 12 capsule 3   No current facility-administered medications for this visit.    Allergies as of 08/10/2023 - Review Complete 07/26/2023  Allergen Reaction Noted   Augmentin [amoxicillin-pot clavulanate] Other (See Comments) 07/03/2017   Sumatriptan Other (See Comments) 04/24/2019   Doxycycline hyclate Hives and Rash 07/23/2009    Vitals: There were no vitals taken for this visit. Last Weight:  Wt Readings from Last 1 Encounters:  07/26/23 165 lb (74.8 kg)   Last Height:   Ht Readings from Last 1 Encounters:  07/26/23 5\' 2"  (1.575 m)     Physical exam: Exam: Gen: NAD, conversant, well nourised, obese, well groomed  CV: RRR, no MRG. No Carotid Bruits. No peripheral edema, warm, nontender Eyes: Conjunctivae clear without exudates or hemorrhage  Neuro: Detailed Neurologic Exam  Speech:    Speech is normal; fluent and spontaneous with normal comprehension.  Cognition:    The patient is oriented to person, place, and time;     recent and remote memory intact;     language fluent;     normal attention, concentration,     fund of knowledge Cranial Nerves:    The pupils are equal, round, and reactive to light. Attempted  fundoscopy could not visualize due to small pupils Visual fields are full to finger confrontation. Extraocular movements are intact. Trigeminal sensation is intact and the muscles of mastication are normal. The face is symmetric. The palate elevates in the midline. Hearing intact. Voice is normal. Shoulder shrug is normal. The tongue has normal motion without fasciculations.   Coordination:    Normal finger to nose and heel to shin.   Gait: nml  Motor Observation:    No asymmetry, no atrophy, and no involuntary movements noted. Tone:    Normal muscle tone.    Posture:    Posture is normal. normal erect    Strength:    Strength is V/V in the upper and lower limbs.      Sensation: intact to LT     Reflex Exam:  DTR's:    Deep tendon reflexes in the upper and lower extremities are symmetrical bilaterally.   Toes:    The toes are equiv bilaterally.   Clonus:    Clonus is absent.    Assessment/Plan:  Chronic intractable migraines  Pulse oximeter overnight - COPD, intractable headaches, refuses sleep eval, will get pulse ox overnight, discuss treatment with pcp Dry needling in the cervical muscles(neck): will refer to [place]:  Physical Therapy: Cervical myofascial pain, forward posture contributing to migraines and cervicalgia. Please evaluate and treat including dry needling, stretching, strengthening, manual therapy/massage, heating, TENS unit, exercising for scapular stabilization, pectoral stretching and rhomboid strengthening as clinically warranted as well as any other modality as recommended by evaluation.  Start Ajovy monthly injections: gave 3 months samples, please try to get patient assistance will discuss when I call her For the next 16 days take qulipta one daily: gave samples, to see if this will help with bridging qould gibe her ubrelvy/nurtec if I had samples but will try this Will call you next week to see how you are next week  No orders of the defined types  were placed in this encounter.  No orders of the defined types were placed in this encounter.   Cc: Early, Sung Amabile, NP,  Early, Sung Amabile, NP  Naomie Dean, MD  [facility] [address] [place], [place] [address]  Phone [phone] Fax [phone]

## 2023-08-11 MED ORDER — EMGALITY 120 MG/ML ~~LOC~~ SOAJ
120.0000 mg | SUBCUTANEOUS | 11 refills | Status: DC
Start: 2023-08-11 — End: 2024-02-15

## 2023-08-11 NOTE — Patient Instructions (Signed)
 Start Emgality  Galcanezumab Injection What is this medication? GALCANEZUMAB (gal ka NEZ ue mab) prevents migraines. It works by blocking a substance in the body that causes migraines. It may also be used to treat cluster headaches. It is a monoclonal antibody. This medicine may be used for other purposes; ask your health care provider or pharmacist if you have questions. COMMON BRAND NAME(S): Emgality What should I tell my care team before I take this medication? They need to know if you have any of these conditions: An unusual or allergic reaction to galcanezumab, other medications, foods, dyes, or preservatives Pregnant or trying to get pregnant Breast-feeding How should I use this medication? This medication is injected under the skin. You will be taught how to prepare and give it. Take it as directed on the prescription label. Keep taking it unless your care team tells you to stop. It is important that you put your used needles and syringes in a special sharps container. Do not put them in a trash can. If you do not have a sharps container, call your pharmacist or care team to get one. Talk to your care team about the use of this medication in children. Special care may be needed. Overdosage: If you think you have taken too much of this medicine contact a poison control center or emergency room at once. NOTE: This medicine is only for you. Do not share this medicine with others. What if I miss a dose? If you miss a dose, take it as soon as you can. If it is almost time for your next dose, take only that dose. Do not take double or extra doses. What may interact with this medication? Interactions are not expected. This list may not describe all possible interactions. Give your health care provider a list of all the medicines, herbs, non-prescription drugs, or dietary supplements you use. Also tell them if you smoke, drink alcohol, or use illegal drugs. Some items may interact with your  medicine. What should I watch for while using this medication? Visit your care team for regular checks on your progress. Tell your care team if your symptoms do not start to get better or if they get worse. What side effects may I notice from receiving this medication? Side effects that you should report to your care team as soon as possible: Allergic reactions or angioedema--skin rash, itching or hives, swelling of the face, eyes, lips, tongue, arms, or legs, trouble swallowing or breathing Side effects that usually do not require medical attention (report to your care team if they continue or are bothersome): Pain, redness, or irritation at injection site This list may not describe all possible side effects. Call your doctor for medical advice about side effects. You may report side effects to FDA at 1-800-FDA-1088. Where should I keep my medication? Keep out of the reach of children and pets. Store in a refrigerator or at room temperature between 20 and 25 degrees C (68 and 77 degrees F). Refrigeration (preferred): Store in the refrigerator. Do not freeze. Keep in the original container until you are ready to take it. Remove the dose from the carton about 30 minutes before it is time for you to use it. If the dose is not used, it may be stored in original container at room temperature for 7 days. Get rid of any unused medication after the expiration date. Room Temperature: This medication may be stored at room temperature for up to 7 days. Keep it in the original  container. Protect from light until time of use. If it is stored at room temperature, get rid of any unused medication after 7 days or after it expires, whichever is first. To get rid of medications that are no longer needed or have expired: Take the medication to a medication take-back program. Check with your pharmacy or law enforcement to find a location. If you cannot return the medication, ask your pharmacist or care team how to get  rid of this medication safely. NOTE: This sheet is a summary. It may not cover all possible information. If you have questions about this medicine, talk to your doctor, pharmacist, or health care provider.  2024 Elsevier/Gold Standard (2021-07-21 00:00:00)

## 2023-08-17 ENCOUNTER — Telehealth: Payer: Self-pay

## 2023-08-17 ENCOUNTER — Other Ambulatory Visit (HOSPITAL_COMMUNITY): Payer: Self-pay

## 2023-08-17 NOTE — Telephone Encounter (Signed)
 Pharmacy Patient Advocate Encounter   Received notification from CoverMyMeds that prior authorization for Emgality 120MG /ML auto-injectors (migraine) is required/requested.   Insurance verification completed.   The patient is insured through [employer] .   Prior Authorization for Emgality 120MG /ML auto-injectors (migraine) has been APPROVED from 08-17-2023 to 06-07-2024   PA #/Case ID/Reference #: [id number]

## 2023-08-23 ENCOUNTER — Encounter (HOSPITAL_COMMUNITY): Payer: Self-pay | Admitting: Internal Medicine

## 2023-08-23 ENCOUNTER — Ambulatory Visit (HOSPITAL_COMMUNITY)
Admission: RE | Admit: 2023-08-23 | Discharge: 2023-08-23 | Disposition: A | Discharge status: Home / Self Care | Payer: Medicare HMO | Source: Ambulatory Visit | Attending: Internal Medicine | Admitting: Internal Medicine

## 2023-08-23 VITALS — BP 120/60 | HR 57 | Ht 64.0 in | Wt 166.4 lb

## 2023-08-23 DIAGNOSIS — I480 Paroxysmal atrial fibrillation: Secondary | ICD-10-CM | POA: Diagnosis present

## 2023-08-23 DIAGNOSIS — Z79899 Other long term (current) drug therapy: Secondary | ICD-10-CM | POA: Diagnosis not present

## 2023-08-23 DIAGNOSIS — Z7901 Long term (current) use of anticoagulants: Secondary | ICD-10-CM | POA: Diagnosis not present

## 2023-08-23 NOTE — Progress Notes (Signed)
 Primary Care Physician: Tollie Eth, NP Referring Physician: Dr. Dietrich Pates Cardiologist: Dr. Samara Snide Amanda [name] is a 68 y.o. female with a h/o  paroxysmal afib that was seen in the ER yesterday with controlled v rates. ER staff opted not to cardiovert her. They spoke to Dr. Tenny Craw and she recommended f/u in the afib clinic today.  Pt was d/c in rate controlled afib but fortunately has returned to SR today. Pt is unaware of when she went back to SR.  She reports that she snores, apnea noted and sleeps in a recliner because of this.  No previous sleep study. She does smoke a pack a day for years, minimal caffeine use and denies alcohol use. She currently takes 120 mg diltiazem daily. She is not on anticoagulation for a CHA2DS2VASc  score of 1-2. Pt denies HTN as a medical problem.    F/u in afib clinic 10/29/21. She is in SR and not noted any afib. She decided she does not want to go thru sleep study at this time. Due to interaction with her meds with most  antiarrythmic's, if her afib burden increases,  she will most likely be an ablation candidate.   F/u in Afib clinic, 01/18/23. She called office noting to be in Afib since 7/22. She was started on Eliquis 5 mg BID on 7/23 due to being in Afib > 24 hours. She is here today in normal rhythm. She felt she converted to normal rhythm the day of or after her phone call to clinic. She had been taking prn diltiazem in addition to her diltiazem 120 mg daily. She feels palpitations as primary symptom historically. She has been taking diltiazem PRN. No missed doses of Eliquis. She does not drink alcohol. She drinks 1 cup of coffee daily.   F/u in Afib clinic, 05/05/23. She is currently in atrial flutter. Patient notes she has been going in and out of rhythm with an episode last Monday-Tuesday and current episode beginning 2 nights ago. She is not on anticoagulation currently due to low Chads score.   On follow up 08/23/23, patient is currently in  NSR. S/p Afib ablation on 07/26/23 by Dr. Nelly Laurence. No episodes of Afib since ablation. She quit smoking tobacco which is great news. No chest pain or SOB. Leg sites healed without issue. No missed doses of Eliquis 5 mg BID.   Today, she denies symptoms of orthopnea, PND, lower extremity edema, dizziness, presyncope, syncope, snoring, daytime somnolence, bleeding, or neurologic sequela. The patient is tolerating medications without difficulties and is otherwise without complaint today.    Past Medical History:  Diagnosis Date   Atrial fibrillation [facility])    DEGENERATIVE JOINT DISEASE 11/12/2006   DISORDER, BIPOLAR NOS 02/28/2007   GERD 11/12/2006   Major depressive disorder, single episode, unspecified 07/15/2021   Past Surgical History:  Procedure Laterality Date   ABDOMINAL HYSTERECTOMY     ATRIAL FIBRILLATION ABLATION N/A 07/26/2023   Procedure: ATRIAL FIBRILLATION ABLATION;  Surgeon: Maurice Small, MD;  Location: [facility] INVASIVE CV LAB;  Service: Cardiovascular;  Laterality: N/A;   BREAST SURGERY     reduction   KNEE ARTHROSCOPY     TONSILLECTOMY      Current Outpatient Medications  Medication Sig Dispense Refill   albuterol (VENTOLIN HFA) 108 (90 Base) MCG/ACT inhaler Inhale 2 puffs into the lungs every 6 (six) hours as needed for shortness of breath or wheezing.     apixaban (ELIQUIS) 5 MG TABS tablet  Take 1 tablet (5 mg total) by mouth 2 (two) times daily. 60 tablet 6   aspirin-acetaminophen-caffeine (EXCEDRIN MIGRAINE) 250-250-65 MG tablet Take 2 tablets by mouth every morning.     baclofen (LIORESAL) 10 MG tablet Take 10 mg by mouth in the morning.     Buprenorphine HCl-Naloxone HCl 1.4-0.36 MG SUBL Place 1-2 tablets under the tongue See admin instructions. Take 2 tablets by mouth in the morning and take 1 tablet in afternoon     ciclopirox (PENLAC) 8 % solution Apply topically at bedtime. Apply over nail and surrounding skin. Apply daily over previous coat. After seven (7) days,  may remove with alcohol and continue cycle. 6.6 mL 2   citalopram (CELEXA) 20 MG tablet TAKE 1 TABLET AT BEDTIME 90 tablet 1   diltiazem (CARDIZEM CD) 120 MG 24 hr capsule TAKE 1 CAPSULE EVERY DAY (NEED MD APPOINTMENT FOR REFILLS) (Patient taking differently: Take 120 mg by mouth at bedtime.) 90 capsule 3   diltiazem (CARDIZEM) 30 MG tablet Take 1 tablet every 4 hours AS NEEDED for heart rate >90 30 tablet 1   ezetimibe (ZETIA) 10 MG tablet Take 10 mg by mouth at bedtime.     furosemide (LASIX) 20 MG tablet Take 1 tablet (20 mg total) by mouth daily as needed for edema. 30 tablet 3   gabapentin (NEURONTIN) 100 MG capsule Take 1 capsule (100 mg total) by mouth at bedtime. 90 capsule 3   lamoTRIgine (LAMICTAL) 100 MG tablet TAKE 1 TABLET EVERY DAY (Patient taking differently: Take 100 mg by mouth at bedtime.) 90 tablet 1   lansoprazole (PREVACID) 30 MG capsule Take 30 mg by mouth at bedtime.     nicotine (NICODERM CQ - DOSED IN MG/24 HOURS) 21 mg/24hr patch Place 21 mg onto the skin daily.     ondansetron (ZOFRAN-ODT) 4 MG disintegrating tablet Take 4 mg by mouth every 8 (eight) hours as needed for vomiting or nausea.     risperiDONE (RISPERDAL) 1 MG tablet TAKE 1 TABLET AT BEDTIME 90 tablet 0   Vitamin D, Ergocalciferol, (DRISDOL) 1.25 MG (50000 UNIT) CAPS capsule Take 1 capsule (50,000 Units total) by mouth every 7 (seven) days. (Patient taking differently: Take 50,000 Units by mouth every Friday.) 12 capsule 3   Galcanezumab-gnlm (EMGALITY) 120 MG/ML SOAJ Inject 120 mg into the skin every 30 (thirty) days. (Patient not taking: Reported on 08/23/2023) 1.12 mL 11   No current facility-administered medications for this encounter.    Allergies  Allergen Reactions   Augmentin [Amoxicillin-Pot Clavulanate] Other (See Comments)    Has patient had a PCN reaction causing immediate rash, facial/tongue/throat swelling, SOB or lightheadedness with hypotension: No Has patient had a PCN reaction causing  severe rash involving mucus membranes or skin necrosis: No Has patient had a PCN reaction that required hospitalization: No Has patient had a PCN reaction occurring within the last 10 years: No If all of the above answers are "NO", then may proceed with Cephalosporin use.    Sumatriptan Other (See Comments)   Doxycycline Hyclate Hives and Rash   ROS- All systems are reviewed and negative except as per the HPI above  Physical Exam: Vitals:   08/23/23 1121  BP: 120/60  Pulse: (!) 57  Weight: 75.5 kg  Height: 5\' 4"  (1.626 m)     Wt Readings from Last 3 Encounters:  08/23/23 75.5 kg  08/10/23 76.2 kg  07/26/23 74.8 kg    Labs: Lab Results  Component Value Date  NA 142 06/30/2023   K 4.5 06/30/2023   CL 101 06/30/2023   CO2 26 06/30/2023   GLUCOSE 105 (H) 06/30/2023   BUN 17 06/30/2023   CREATININE 0.93 06/30/2023   CALCIUM 9.0 06/30/2023   MG 2.1 07/03/2017   No results found for: "INR" Lab Results  Component Value Date   CHOL 206 (H) 07/15/2021   HDL 43 07/15/2021   LDLCALC 140 (H) 07/15/2021   TRIG 126 07/15/2021   GEN- The patient is well appearing, alert and oriented x 3 today.   Neck - no JVD or carotid bruit noted Lungs- Clear to ausculation bilaterally, normal work of breathing Heart- Regular rate and rhythm, no murmurs, rubs or gallops, PMI not laterally displaced Extremities- no clubbing, cyanosis, or edema Skin - no rash or ecchymosis noted  EKG- Vent. rate 57 BPM PR interval 142 ms QRS duration 80 ms QT/QTcB 424/412 ms P-R-T axes 85 13 15 Sinus bradycardia Cannot rule out Anterior infarct , age undetermined Abnormal ECG When compared with ECG of 26-Jul-2023 12:03, PREVIOUS ECG IS PRESENT  Echo- 6 2022-1. Technically difficlut echo with poor image quality.   2. Left ventricular ejection fraction, by estimation, is 60 to 65%. The  left ventricle has normal function. The left ventricle has no regional  wall motion abnormalities. Left  ventricular diastolic parameters were  normal.   3. Right ventricular systolic function is normal. The right ventricular  size is normal.   4. The mitral valve was not well visualized. No evidence of mitral valve  regurgitation.   5. The aortic valve was not well visualized. Aortic valve regurgitation  is not visualized. No aortic stenosis is present.   Assessment and Plan:  1. Paroxysmal afib / flutter S/p Afib ablation on 07/26/23 by Dr. Nelly Laurence.  She is currently in NSR. Continue diltiazem 120 mg daily.   Previous discussion on AAD treatment options:  Here are the thoughts of Margaretmary Dys, PharmD - "[name] on a lot of QTc prolonging meds. Flecainide will interact with celexa and risperidone, multaq is contraindicated with celexa and also interacts with risperidone. Celexa is one of the most qtc prolonging SSRIs, not sure if that could be changed to something else by PCP? then either option might be ok but would still need close EKG monitoring bc of the interaction with risperidone"  2. CHA2DS2VASc  score of 1-2 ( pt denies BP dx)  Continue Eliquis 5 mg BID without interruption.    Follow up as scheduled with EP.    [place] Bells, PA-C Afib Clinic [facility] [address] [place], [place] [address] [phone]

## 2023-09-07 ENCOUNTER — Ambulatory Visit: Payer: Medicare HMO

## 2023-09-07 DIAGNOSIS — Z0000 Encounter for general adult medical examination without abnormal findings: Secondary | ICD-10-CM | POA: Diagnosis not present

## 2023-09-07 NOTE — Patient Instructions (Signed)
 Ms. Amanda [name] , Thank you for taking time to come for your Medicare Wellness Visit. I appreciate your ongoing commitment to your health goals. Please review the following plan we discussed and let me know if I can assist you in the future.   Referrals/Orders/Follow-Ups/Clinician Recommendations: none  This is a list of the screening recommended for you and due dates:  Health Maintenance  Topic Date Due   Zoster (Shingles) Vaccine (1 of 2) Never done   Pap with HPV screening  Never done   COVID-19 Vaccine (2 - Pfizer risk series) 06/06/2020   Screening for Lung Cancer  12/09/2023*   Mammogram  12/09/2023*   Pneumonia Vaccine (1 of 2 - PCV) 06/07/2024*   DEXA scan (bone density measurement)  06/07/2024*   Flu Shot  01/07/2024   Medicare Annual Wellness Visit  09/06/2024   DTaP/Tdap/Td vaccine (3 - Td or Tdap) 04/16/2027   Colon Cancer Screening  11/25/2032   HIV Screening  Completed   HPV Vaccine  Aged Out   Hepatitis C Screening  Discontinued  *Topic was postponed. The date shown is not the original due date.    Advanced directives: (Copy Requested) Please bring a copy of your health care power of attorney and living will to the office to be added to your chart at your convenience. You can mail to [facility] [address] [place], [place] [address] or email to [email or web address]  Next Medicare Annual Wellness Visit scheduled for next year: Yes  insert Preventive Care attachment Insert FALL PREVENTION attachment if needed

## 2023-09-07 NOTE — Progress Notes (Signed)
 Subjective:   Amanda [name] is a 69 y.o. who presents for a Medicare Wellness preventive visit.  Visit Complete: Virtual I connected with  Amanda [name] on 09/07/23 by a audio enabled telemedicine application and verified that I am speaking with the correct person using two identifiers.  Patient Location: Home  Provider Location: Office/Clinic  I discussed the limitations of evaluation and management by telemedicine. The patient expressed understanding and agreed to proceed.  Vital Signs: Because this visit was a virtual/telehealth visit, some criteria may be missing or patient reported. Any vitals not documented were not able to be obtained and vitals that have been documented are patient reported.  VideoError- Librarian, academic were attempted between this provider and patient, however failed, due to patient having technical difficulties OR patient did not have access to video capability.  We continued and completed visit with audio only.   Persons Participating in Visit: Patient.  AWV Questionnaire: Yes: Patient Medicare AWV questionnaire was completed by the patient on 09/06/2023; I have confirmed that all information answered by patient is correct and no changes since this date.  Cardiac Risk Factors include: advanced age (>77men, >74 women);hypertension     Objective:    Today's Vitals   There is no height or weight on file to calculate BMI.     09/07/2023   11:38 AM 07/26/2023    8:52 AM 04/21/2023   12:40 PM 09/01/2022   11:33 AM 09/16/2021   12:30 PM 07/04/2017   12:00 AM 07/03/2014    3:30 PM  Advanced Directives  Does Patient Have a Medical Advance Directive? Yes Yes No Yes No No No  Type of Estate agent of [place];Living will Healthcare Power of [place];Living will  Healthcare Power of [place];Living will     Does patient want to make changes to medical advance directive?  No - Patient declined       Copy  of Healthcare Power of Attorney in Chart? No - copy requested No - copy requested  No - copy requested     Would patient like information on creating a medical advance directive?   No - Patient declined  No - Patient declined No - Patient declined     Current Medications (verified) Outpatient Encounter Medications as of 09/07/2023  Medication Sig   albuterol (VENTOLIN HFA) 108 (90 Base) MCG/ACT inhaler Inhale 2 puffs into the lungs every 6 (six) hours as needed for shortness of breath or wheezing.   apixaban (ELIQUIS) 5 MG TABS tablet Take 1 tablet (5 mg total) by mouth 2 (two) times daily.   baclofen (LIORESAL) 10 MG tablet Take 10 mg by mouth in the morning.   Buprenorphine HCl-Naloxone HCl 1.4-0.36 MG SUBL Place 1-2 tablets under the tongue See admin instructions. Take 2 tablets by mouth in the morning and take 1 tablet in afternoon   ciclopirox (PENLAC) 8 % solution Apply topically at bedtime. Apply over nail and surrounding skin. Apply daily over previous coat. After seven (7) days, may remove with alcohol and continue cycle.   citalopram (CELEXA) 20 MG tablet TAKE 1 TABLET AT BEDTIME   diltiazem (CARDIZEM CD) 120 MG 24 hr capsule TAKE 1 CAPSULE EVERY DAY (NEED MD APPOINTMENT FOR REFILLS) (Patient taking differently: Take 120 mg by mouth at bedtime.)   diltiazem (CARDIZEM) 30 MG tablet Take 1 tablet every 4 hours AS NEEDED for heart rate >90   ezetimibe (ZETIA) 10 MG tablet Take 10 mg by mouth at  bedtime.   furosemide (LASIX) 20 MG tablet Take 1 tablet (20 mg total) by mouth daily as needed for edema.   gabapentin (NEURONTIN) 100 MG capsule Take 1 capsule (100 mg total) by mouth at bedtime.   lamoTRIgine (LAMICTAL) 100 MG tablet TAKE 1 TABLET EVERY DAY (Patient taking differently: Take 100 mg by mouth at bedtime.)   lansoprazole (PREVACID) 30 MG capsule Take 30 mg by mouth at bedtime.   nicotine (NICODERM CQ - DOSED IN MG/24 HOURS) 21 mg/24hr patch Place 21 mg onto the skin daily.    ondansetron (ZOFRAN-ODT) 4 MG disintegrating tablet Take 4 mg by mouth every 8 (eight) hours as needed for vomiting or nausea.   risperiDONE (RISPERDAL) 1 MG tablet TAKE 1 TABLET AT BEDTIME   Vitamin D, Ergocalciferol, (DRISDOL) 1.25 MG (50000 UNIT) CAPS capsule Take 1 capsule (50,000 Units total) by mouth every 7 (seven) days. (Patient taking differently: Take 50,000 Units by mouth every Friday.)   aspirin-acetaminophen-caffeine (EXCEDRIN MIGRAINE) 250-250-65 MG tablet Take 2 tablets by mouth every morning.   Galcanezumab-gnlm (EMGALITY) 120 MG/ML SOAJ Inject 120 mg into the skin every 30 (thirty) days. (Patient not taking: Reported on 09/07/2023)   No facility-administered encounter medications on file as of 09/07/2023.    Allergies (verified) Augmentin [amoxicillin-pot clavulanate], Sumatriptan, and Doxycycline hyclate   History: Past Medical History:  Diagnosis Date   Atrial fibrillation [facility])    DEGENERATIVE JOINT DISEASE 11/12/2006   DISORDER, BIPOLAR NOS 02/28/2007   GERD 11/12/2006   Major depressive disorder, single episode, unspecified 07/15/2021   Past Surgical History:  Procedure Laterality Date   ABDOMINAL HYSTERECTOMY     ATRIAL FIBRILLATION ABLATION N/A 07/26/2023   Procedure: ATRIAL FIBRILLATION ABLATION;  Surgeon: Maurice Small, MD;  Location: [facility] INVASIVE CV LAB;  Service: Cardiovascular;  Laterality: N/A;   BREAST SURGERY     reduction   KNEE ARTHROSCOPY     TONSILLECTOMY     Family History  Problem Relation Age of Onset   Dementia Mother    Congestive Heart Failure Father    Migraines Father    Dementia Maternal Aunt    Dementia Maternal Aunt    Dementia Maternal Grandmother    Social History   Socioeconomic History   Marital status: Married    Spouse name: Jillyn Hidden   Number of children: 1   Years of education: Not on file   Highest education level: Not on file  Occupational History   Not on file  Tobacco Use   Smoking status: Former    Current  packs/day: 0.50    Average packs/day: 0.5 packs/day for 45.0 years (22.5 ttl pk-yrs)    Types: Cigarettes   Smokeless tobacco: Never   Tobacco comments:    Current smoker 1ppd 05/05/23  Vaping Use   Vaping status: Never Used  Substance and Sexual Activity   Alcohol use: No   Drug use: Yes    Frequency: 7.0 times per week    Types: Marijuana    Comment: daily marijuana use   Sexual activity: Not Currently    Partners: Male  Other Topics Concern   Not on file  Social History Narrative   Not on file   Social Drivers of Health   Financial Resource Strain: Low Risk  (09/07/2023)   Overall Financial Resource Strain (CARDIA)    Difficulty of Paying Living Expenses: Not hard at all  Food Insecurity: No Food Insecurity (09/07/2023)   Hunger Vital Sign    Worried About Running Out  of Food in the Last Year: Never true    Ran Out of Food in the Last Year: Never true  Transportation Needs: No Transportation Needs (09/07/2023)   PRAPARE - Administrator, Civil Service (Medical): No    Lack of Transportation (Non-Medical): No  Physical Activity: Inactive (09/07/2023)   Exercise Vital Sign    Days of Exercise per Week: 0 days    Minutes of Exercise per Session: 0 min  Stress: No Stress Concern Present (09/07/2023)   [employer] of Occupational Health - Occupational Stress Questionnaire    Feeling of Stress : Only a little  Social Connections: Moderately Isolated (09/07/2023)   Social Connection and Isolation Panel [NHANES]    Frequency of Communication with Friends and Family: More than three times a week    Frequency of Social Gatherings with Friends and Family: Not on file    Attends Religious Services: Never    Database administrator or Organizations: No    Attends Banker Meetings: Never    Marital Status: Married    Tobacco Counseling Counseling given: Not Answered Tobacco comments: Current smoker 1ppd 05/05/23    Clinical Intake:  Pre-visit  preparation completed: Yes  Pain : No/denies pain     Nutritional Risks: Nausea/ vomitting/ diarrhea (nausea and takes zofran) Diabetes: No  Lab Results  Component Value Date   HGBA1C 5.6 10/29/2006     How often do you need to have someone help you when you read instructions, pamphlets, or other written materials from your doctor or pharmacy?: 1 - Never  Interpreter Needed?: No  Information entered by :: NAllen LPN   Activities of Daily Living     09/06/2023    2:21 PM  In your present state of health, do you have any difficulty performing the following activities:  Hearing? 0  Vision? 0  Difficulty concentrating or making decisions? 0  Walking or climbing stairs? 1  Comment bad knee  Dressing or bathing? 0  Doing errands, shopping? 0  Preparing Food and eating ? N  Using the Toilet? N  In the past six months, have you accidently leaked urine? N  Do you have problems with loss of bowel control? N  Managing your Medications? N  Managing your Finances? N  Housekeeping or managing your Housekeeping? N    Patient Care Team: Early, Sung Amabile, NP as PCP - General (Nurse Practitioner) Lewayne Bunting, MD as PCP - Cardiology (Cardiology) Mealor, Roberts Gaudy, MD as PCP - Electrophysiology (Cardiology)  Indicate any recent Medical Services you may have received from other than [facility] providers in the past year (date may be approximate).     Assessment:   This is a routine wellness examination for Amanda [name].  Hearing/Vision screen Hearing Screening - Comments:: Denies hearing issues Vision Screening - Comments:: Regular eye exams, [facility]   Goals Addressed             This Visit's Progress    Patient Stated       09/07/2023, getting health better       Depression Screen     09/07/2023   11:37 AM 09/01/2022   11:33 AM 04/24/2022   10:49 AM 07/17/2021    7:35 AM  PHQ 2/9 Scores  PHQ - 2 Score 3 0 0 0  PHQ- 9 Score 3 0 0 0    Fall Risk     09/06/2023     2:21 PM 09/01/2022   11:33  AM 04/24/2022   10:50 AM 07/17/2021    7:35 AM  Fall Risk   Falls in the past year? 0 0 0 0  Number falls in past yr: 0 0  0  Injury with Fall? 0 0  0  Risk for fall due to : Medication side effect Medication side effect  No Fall Risks  Follow up Falls prevention discussed;Falls evaluation completed Falls prevention discussed;Education provided;Falls evaluation completed  Falls evaluation completed    MEDICARE RISK AT HOME:  Medicare Risk at Home Any stairs in or around the home?: (Patient-Rptd) Yes If so, are there any without handrails?: (Patient-Rptd) No Home free of loose throw rugs in walkways, pet beds, electrical cords, etc?: (Patient-Rptd) Yes Adequate lighting in your home to reduce risk of falls?: (Patient-Rptd) Yes Life alert?: (Patient-Rptd) No Use of a cane, walker or w/c?: (Patient-Rptd) No Grab bars in the bathroom?: (Patient-Rptd) No Shower chair or bench in shower?: (Patient-Rptd) No Elevated toilet seat or a handicapped toilet?: (Patient-Rptd) No  TIMED UP AND GO:  Was the test performed?  No  Cognitive Function: 6CIT completed        09/07/2023   11:40 AM 09/01/2022   11:35 AM  6CIT Screen  What Year? 0 points 0 points  What month? 0 points 0 points  What time? 0 points 0 points  Count back from 20 0 points 0 points  Months in reverse 0 points 0 points  Repeat phrase 0 points 0 points  Total Score 0 points 0 points    Immunizations Immunization History  Administered Date(s) Administered   Influenza, Quadrivalent, Recombinant, Inj, Pf 04/15/2017, 02/17/2018, 03/30/2019   Influenza,inj,Quad PF,6+ Mos 04/24/2022   Influenza,inj,quad, With Preservative 05/08/2017   Influenza-Unspecified 04/19/2012, 02/28/2013, 03/11/2015, 05/10/2016, 04/02/2019, 03/08/2021   PFIZER(Purple Top)SARS-COV-2 Vaccination 05/16/2020   Td 06/08/2002   Tdap 04/15/2017    Screening Tests Health Maintenance  Topic Date Due   Zoster Vaccines-  Shingrix (1 of 2) Never done   Cervical Cancer Screening (HPV/Pap Cotest)  Never done   COVID-19 Vaccine (2 - Pfizer risk series) 06/06/2020   Lung Cancer Screening  12/09/2023 (Originally 10/21/2021)   MAMMOGRAM  12/09/2023 (Originally 04/24/2008)   Pneumonia Vaccine 67+ Years old (1 of 2 - PCV) 06/07/2024 (Originally 04/24/1964)   DEXA SCAN  06/07/2024 (Originally 04/25/2023)   INFLUENZA VACCINE  01/07/2024   Medicare Annual Wellness (AWV)  09/06/2024   DTaP/Tdap/Td (3 - Td or Tdap) 04/16/2027   Colonoscopy  11/25/2032   HIV Screening  Completed   HPV VACCINES  Aged Out   Hepatitis C Screening  Discontinued    Health Maintenance  Health Maintenance Due  Topic Date Due   Zoster Vaccines- Shingrix (1 of 2) Never done   Cervical Cancer Screening (HPV/Pap Cotest)  Never done   COVID-19 Vaccine (2 - Pfizer risk series) 06/06/2020   Health Maintenance Items Addressed: Declines vaccines. Has a mammogram appointment in May  Additional Screening:  Vision Screening: Recommended annual ophthalmology exams for early detection of glaucoma and other disorders of the eye.  Dental Screening: Recommended annual dental exams for proper oral hygiene  Community Resource Referral / Chronic Care Management: CRR required this visit?  No   CCM required this visit?  No     Plan:     I have personally reviewed and noted the following in the patient's chart:   Medical and social history Use of alcohol, tobacco or illicit drugs  Current medications and supplements including opioid prescriptions. Patient  is not currently taking opioid prescriptions. Functional ability and status Nutritional status Physical activity Advanced directives List of other physicians Hospitalizations, surgeries, and ER visits in previous 12 months Vitals Screenings to include cognitive, depression, and falls Referrals and appointments  In addition, I have reviewed and discussed with patient certain preventive  protocols, quality metrics, and best practice recommendations. A written personalized care plan for preventive services as well as general preventive health recommendations were provided to patient.     Barb Merino, LPN   12/14/4694   After Visit Summary: (MyChart) Due to this being a telephonic visit, the after visit summary with patients personalized plan was offered to patient via MyChart   Notes: Nothing significant to report at this time.

## 2023-09-14 ENCOUNTER — Ambulatory Visit: Payer: Medicare HMO | Admitting: Physician Assistant

## 2023-09-14 ENCOUNTER — Encounter: Payer: Self-pay | Admitting: Physician Assistant

## 2023-09-14 DIAGNOSIS — Z87891 Personal history of nicotine dependence: Secondary | ICD-10-CM

## 2023-09-14 DIAGNOSIS — F319 Bipolar disorder, unspecified: Secondary | ICD-10-CM | POA: Diagnosis not present

## 2023-09-14 DIAGNOSIS — F411 Generalized anxiety disorder: Secondary | ICD-10-CM | POA: Diagnosis not present

## 2023-09-14 MED ORDER — LAMOTRIGINE 100 MG PO TABS: 100.0000 mg | ORAL_TABLET | Freq: Every day | ORAL | 2 refills | Status: DC

## 2023-09-14 MED ORDER — RISPERIDONE 1 MG PO TABS: 1.0000 mg | ORAL_TABLET | Freq: Every day | ORAL | 2 refills | Status: DC

## 2023-09-14 MED ORDER — CITALOPRAM HYDROBROMIDE 20 MG PO TABS: 20.0000 mg | ORAL_TABLET | Freq: Every day | ORAL | 2 refills | Status: DC

## 2023-09-14 MED ORDER — GABAPENTIN 100 MG PO CAPS: 100.0000 mg | ORAL_CAPSULE | Freq: Every day | ORAL | 2 refills | Status: DC

## 2023-09-14 NOTE — Progress Notes (Signed)
 [facility] Check  Patient ID: LU PARADISE,  MRN: [id number]  PCP: Tollie Eth, NP  Date of Evaluation: 09/14/2023 Time spent:20 minutes  Chief Complaint:  Chief Complaint   Anxiety; Follow-up    HISTORY/CURRENT STATUS: HPI For routine med check.   Quit smoking in Feb! Had been smoking 40 years! Was dx with COPD and "I had to do something. I don't want emphysema." Feels a lot better since she quit, more energy.    Patient is able to enjoy things.  Energy and motivation are good.  No extreme sadness, tearfulness, or feelings of hopelessness.  Sleeps well most of the time. ADLs and personal hygiene are normal.   Denies any changes in concentration, making decisions, or remembering things.  Appetite has not changed.  Weight is stable.  Not having anxiety.  Denies suicidal or homicidal thoughts.  Patient denies increased energy with decreased need for sleep, increased talkativeness, racing thoughts, impulsivity or risky behaviors, increased spending, increased libido, grandiosity, increased irritability or anger, paranoia, or hallucinations.  Denies dizziness, syncope, seizures, numbness, tingling, tremor, tics, unsteady gait, slurred speech, confusion. Denies muscle or joint pain, stiffness, or dystonia.  Individual Medical History/ Review of Systems: Changes? :No     Past medications for mental health diagnoses include: Depakote, Lamictal, Abilify, Xanax, Celexa, Risperdal, lithium  Allergies: Augmentin [amoxicillin-pot clavulanate], Sumatriptan, and Doxycycline hyclate  Current Medications:  Current Outpatient Medications:    albuterol (VENTOLIN HFA) 108 (90 Base) MCG/ACT inhaler, Inhale 2 puffs into the lungs every 6 (six) hours as needed for shortness of breath or wheezing., Disp: , Rfl:    apixaban (ELIQUIS) 5 MG TABS tablet, Take 1 tablet (5 mg total) by mouth 2 (two) times daily., Disp: 60 tablet, Rfl: 6   baclofen (LIORESAL) 10 MG tablet, Take 10 mg by mouth in  the morning., Disp: , Rfl:    Buprenorphine HCl-Naloxone HCl 1.4-0.36 MG SUBL, Place 1-2 tablets under the tongue See admin instructions. Take 2 tablets by mouth in the morning and take 1 tablet in afternoon, Disp: , Rfl:    ciclopirox (PENLAC) 8 % solution, Apply topically at bedtime. Apply over nail and surrounding skin. Apply daily over previous coat. After seven (7) days, may remove with alcohol and continue cycle., Disp: 6.6 mL, Rfl: 2   diltiazem (CARDIZEM CD) 120 MG 24 hr capsule, TAKE 1 CAPSULE EVERY DAY (NEED MD APPOINTMENT FOR REFILLS) (Patient taking differently: Take 120 mg by mouth at bedtime.), Disp: 90 capsule, Rfl: 3   ezetimibe (ZETIA) 10 MG tablet, Take 10 mg by mouth at bedtime., Disp: , Rfl:    furosemide (LASIX) 20 MG tablet, Take 1 tablet (20 mg total) by mouth daily as needed for edema., Disp: 30 tablet, Rfl: 3   Galcanezumab-gnlm (EMGALITY) 120 MG/ML SOAJ, Inject 120 mg into the skin every 30 (thirty) days., Disp: 1.12 mL, Rfl: 11   lansoprazole (PREVACID) 30 MG capsule, Take 30 mg by mouth at bedtime., Disp: , Rfl:    ondansetron (ZOFRAN-ODT) 4 MG disintegrating tablet, Take 4 mg by mouth every 8 (eight) hours as needed for vomiting or nausea., Disp: , Rfl:    Vitamin D, Ergocalciferol, (DRISDOL) 1.25 MG (50000 UNIT) CAPS capsule, Take 1 capsule (50,000 Units total) by mouth every 7 (seven) days. (Patient taking differently: Take 50,000 Units by mouth every Friday.), Disp: 12 capsule, Rfl: 3   aspirin-acetaminophen-caffeine (EXCEDRIN MIGRAINE) 250-250-65 MG tablet, Take 2 tablets by mouth every morning., Disp: , Rfl:    citalopram (  CELEXA) 20 MG tablet, Take 1 tablet (20 mg total) by mouth at bedtime., Disp: 90 tablet, Rfl: 2   diltiazem (CARDIZEM) 30 MG tablet, Take 1 tablet every 4 hours AS NEEDED for heart rate >90, Disp: 30 tablet, Rfl: 1   gabapentin (NEURONTIN) 100 MG capsule, Take 1 capsule (100 mg total) by mouth at bedtime., Disp: 90 capsule, Rfl: 2   lamoTRIgine  (LAMICTAL) 100 MG tablet, Take 1 tablet (100 mg total) by mouth daily., Disp: 90 tablet, Rfl: 2   nicotine (NICODERM CQ - DOSED IN MG/24 HOURS) 21 mg/24hr patch, Place 21 mg onto the skin daily. (Patient not taking: Reported on 09/14/2023), Disp: , Rfl:    risperiDONE (RISPERDAL) 1 MG tablet, Take 1 tablet (1 mg total) by mouth at bedtime., Disp: 90 tablet, Rfl: 2 Medication Side Effects: none  Family Medical/ Social History: Changes? No  MENTAL HEALTH EXAM:  There were no vitals taken for this visit.There is no height or weight on file to calculate BMI.  General Appearance: Casual, Neat and Well Groomed  Eye Contact:  Good  Speech:  Clear and Coherent, Normal Rate, and Talkative  Volume:  Normal  Mood:  Euthymic  Affect:  Appropriate  Thought Process:  Goal Directed and Descriptions of Associations: Circumstantial  Orientation:  Full (Time, Place, and Person)  Thought Content: Logical   Suicidal Thoughts:  No  Homicidal Thoughts:  No  Memory:  WNL  Judgement:  Good  Insight:  Good  Psychomotor Activity:  Normal  Concentration:  Concentration: Good and Attention Span: Good  Recall:  Good  Fund of Knowledge: Good  Language: Good  Assets:  Desire for Improvement Financial Resources/Insurance Housing Resilience Transportation  ADL's:  Intact  Cognition: WNL  Prognosis:  Good   PCP follows labs.   DIAGNOSES:    ICD-10-CM   1. Bipolar I disorder (HCC)  F31.9     2. Generalized anxiety disorder  F41.1     3. Former heavy cigarette smoker (20-39 per day)  Z87.891       Receiving Psychotherapy: No   RECOMMENDATIONS:  PDMP reviewed. [employer] 08/25/2023. I provided 20 minutes of face to face time during this encounter, including time spent before and after the visit in records review, medical decision making, counseling pertinent to today's visit, and charting.   Congrats on smoking cessation!  [name] doing well w/ mental health meds so no changes made.  Continue Celexa  20 mg qd. Continue Gabapentin 100 mg qhs. Continue Lamictal 100 mg, 1 qhs. Continue Risperdal 1 mg, 1 qhs.  Return in 9 months.  Melony Overly, PA-C

## 2023-09-28 ENCOUNTER — Ambulatory Visit: Admitting: Nurse Practitioner

## 2023-09-28 ENCOUNTER — Encounter: Payer: Self-pay | Admitting: Nurse Practitioner

## 2023-09-28 VITALS — BP 120/78 | HR 58 | Wt 165.4 lb

## 2023-09-28 DIAGNOSIS — J439 Emphysema, unspecified: Secondary | ICD-10-CM | POA: Diagnosis not present

## 2023-09-28 DIAGNOSIS — Z87891 Personal history of nicotine dependence: Secondary | ICD-10-CM | POA: Diagnosis not present

## 2023-09-28 DIAGNOSIS — M5481 Occipital neuralgia: Secondary | ICD-10-CM

## 2023-09-28 DIAGNOSIS — Z720 Tobacco use: Secondary | ICD-10-CM

## 2023-09-28 DIAGNOSIS — J209 Acute bronchitis, unspecified: Secondary | ICD-10-CM

## 2023-09-28 DIAGNOSIS — I480 Paroxysmal atrial fibrillation: Secondary | ICD-10-CM | POA: Diagnosis not present

## 2023-09-28 MED ORDER — NICOTINE 14 MG/24HR TD PT24: MEDICATED_PATCH | TRANSDERMAL | 0 refills | Status: DC

## 2023-09-28 NOTE — Progress Notes (Signed)
 Dell Fennel, DNP, AGNP-c [facility]  [address] [place], [place] [address] [phone]  ESTABLISHED PATIENT- Chronic Health and/or Follow-Up Visit  Blood pressure 120/78, pulse (!) 58, weight 165 lb 6.4 oz (75 kg), SpO2 95%.    Amanda [name] is a 68 y.o. year old female presenting today for evaluation and management of chronic conditions.   History of Present Illness Amanda [name] is a 68 year old female with COPD who presents for follow-up after quitting smoking.  She has successfully quit smoking after 45 years, utilizing support from a quit line and nicotine  patches. Initially, she used 21 mg patches, tapered to 14 mg, and is currently using 7 mg patches. However, she finds the 7 mg patches insufficient and has purchased additional 14 mg patches. Since quitting, she notes improved breathing but still experiences some respiratory symptoms, particularly at night. Her breathing has improved since quitting smoking, and she uses her inhaler less frequently. Drinking water helps alleviate her symptoms.  She has a history of migraines and is currently using Ajovy , with plans to try Emgality . She reports a significant reduction in headache frequency, with a recent period of not needing medication for headaches. However, she experiences brief headaches that she can alleviate by applying pressure to her head.  She has a history of atrial fibrillation and has undergone an ablation procedure, which has been successful in preventing further episodes.  She has a history of diverticulitis and was previously taking medication for heartburn and indigestion, which she stopped after realizing it was causing her symptoms. Since discontinuing the medication, she has not experienced heartburn or indigestion.  She reports losing weight, having gone from 172 pounds to 165.4 pounds, and attributes this to quitting smoking and taking fluid pills. She also mentions engaging in  gardening as a form of physical activity.  All ROS negative with exception of what is listed above.   PHYSICAL EXAM Physical Exam Vitals and nursing note reviewed.  Constitutional:      General: She is not in acute distress.    Appearance: Normal appearance. She is not ill-appearing.  Eyes:     Conjunctiva/sclera: Conjunctivae normal.  Cardiovascular:     Rate and Rhythm: Normal rate and regular rhythm.     Pulses: Normal pulses.     Heart sounds: Normal heart sounds.  Pulmonary:     Effort: Pulmonary effort is normal.     Breath sounds: Wheezing present.  Skin:    General: Skin is warm and dry.     Capillary Refill: Capillary refill takes less than 2 seconds.  Neurological:     Mental Status: She is alert and oriented to person, place, and time.  Psychiatric:        Mood and Affect: Mood normal.        Behavior: Behavior normal.      PLAN Problem List Items Addressed This Visit     Atrial fibrillation (HCC)   Status post-ablation with no recurrence of atrial fibrillation.      COPD (chronic obstructive pulmonary disease) (HCC)   COPD with improved respiratory function following smoking cessation. Reduced inhaler use and anticipated lung function improvement as lungs heal. Discussed potential for increased cough as lungs clear debris. - Recommend Mucinex as needed for mucus clearance. - Encourage increased water intake to aid mucus clearance.      Relevant Medications   nicotine  (NICODERM CQ  - DOSED IN MG/24 HOURS) 14 mg/24hr patch   History of smoking   Successfully  quit smoking after 45 years using quit line resources and nicotine  patches. Currently on 14 mg patches after tapering from 21 mg. Reports increased cravings with 7 mg patches. Discussed [place]  resources for smoking cessation support, including patches and lozenges. - Continue 14 mg nicotine  patches. - Send prescription for nicotine  patches to [facility] pharmacy. - Encourage gradual tapering of  nicotine  patch dosage.      Occipital neuralgia   Occipital neuralgia with right-sided headaches originating from the neck. Symptoms include tingling and relief with neck support. Discussed potential benefit of neck traction device. - Recommend purchasing a neck traction device from [employer] for home use.      Relevant Medications   nicotine  (NICODERM CQ  - DOSED IN MG/24 HOURS) 14 mg/24hr patch   Other Visit Diagnoses       Currently attempting to quit smoking    -  Primary   Relevant Medications   nicotine  (NICODERM CQ  - DOSED IN MG/24 HOURS) 14 mg/24hr patch       Return if symptoms worsen or fail to improve.  Dell Fennel, DNP, AGNP-c

## 2023-10-01 DIAGNOSIS — M7671 Peroneal tendinitis, right leg: Secondary | ICD-10-CM | POA: Insufficient documentation

## 2023-10-06 ENCOUNTER — Ambulatory Visit: Payer: Medicare HMO | Admitting: Pulmonary Disease

## 2023-10-06 ENCOUNTER — Encounter: Payer: Self-pay | Admitting: Pulmonary Disease

## 2023-10-06 VITALS — BP 122/67 | HR 70 | Temp 98.1°F | Ht 63.0 in | Wt 167.2 lb

## 2023-10-06 DIAGNOSIS — G43509 Persistent migraine aura without cerebral infarction, not intractable, without status migrainosus: Secondary | ICD-10-CM | POA: Diagnosis not present

## 2023-10-06 DIAGNOSIS — Z87891 Personal history of nicotine dependence: Secondary | ICD-10-CM | POA: Diagnosis not present

## 2023-10-06 DIAGNOSIS — J439 Emphysema, unspecified: Secondary | ICD-10-CM | POA: Diagnosis not present

## 2023-10-06 DIAGNOSIS — R0683 Snoring: Secondary | ICD-10-CM

## 2023-10-06 DIAGNOSIS — M5481 Occipital neuralgia: Secondary | ICD-10-CM | POA: Insufficient documentation

## 2023-10-06 DIAGNOSIS — Z8669 Personal history of other diseases of the nervous system and sense organs: Secondary | ICD-10-CM

## 2023-10-06 DIAGNOSIS — J431 Panlobular emphysema: Secondary | ICD-10-CM

## 2023-10-06 NOTE — Assessment & Plan Note (Signed)
 Successfully quit smoking after 45 years using quit line resources and nicotine  patches. Currently on 14 mg patches after tapering from 21 mg. Reports increased cravings with 7 mg patches. Discussed [place]  resources for smoking cessation support, including patches and lozenges. - Continue 14 mg nicotine  patches. - Send prescription for nicotine  patches to [facility]. - Encourage gradual tapering of nicotine  patch dosage.

## 2023-10-06 NOTE — Assessment & Plan Note (Signed)
 Status post-ablation with no recurrence of atrial fibrillation.

## 2023-10-06 NOTE — Assessment & Plan Note (Signed)
 Occipital neuralgia with right-sided headaches originating from the neck. Symptoms include tingling and relief with neck support. Discussed potential benefit of neck traction device. - Recommend purchasing a neck traction device from [employer] for home use.

## 2023-10-06 NOTE — Progress Notes (Signed)
 Amanda [name]    [id number]    1956-01-31  Primary Care Physician:Early, Adriane Albe, NP  Referring Physician: Annella Kief, NP [address] [place],  [place] [address]  Chief complaint:   Patient being seen for daily migraines  HPI:  Being evaluated by neurology for her migraines Concern for obstructive sleep apnea  Admits to snoring Admits to dryness of her mouth in the mornings No morning sore throat Memory is poor Has not been told about apneas, No gasping respirations   Has migraines almost daily worse in the mornings and also late evenings  Usually sleeps in a recliner recently quit smoking in February 2025 almost 90-pack-year smoking history  Activity level limited by chronic back pain Has spinal stenosis can walk long distances - Has been on meloxicam  Usually tries to go to bed by 11, takes about 5 minutes to fall asleep Up to 3 awakenings Final wake up time about 9 AM  Weight has been stable History of obstructive lung disease - Uses albuterol as needed  Outpatient Encounter Medications as of 10/06/2023  Medication Sig   albuterol (VENTOLIN HFA) 108 (90 Base) MCG/ACT inhaler Inhale 2 puffs into the lungs every 6 (six) hours as needed for shortness of breath or wheezing.   apixaban  (ELIQUIS ) 5 MG TABS tablet Take 1 tablet (5 mg total) by mouth 2 (two) times daily.   baclofen (LIORESAL) 10 MG tablet Take 10 mg by mouth in the morning.   Buprenorphine  HCl-Naloxone  HCl 1.4-0.36 MG SUBL Place 1-2 tablets under the tongue See admin instructions. Take 2 tablets by mouth in the morning and take 1 tablet in afternoon   ciclopirox  (PENLAC ) 8 % solution Apply topically at bedtime. Apply over nail and surrounding skin. Apply daily over previous coat. After seven (7) days, may remove with alcohol and continue cycle.   citalopram  (CELEXA ) 20 MG tablet Take 1 tablet (20 mg total) by mouth at bedtime.   diclofenac Sodium (VOLTAREN) 1 % GEL APPLY 2 GRAMS  TO THE AFFECTED AREA(S) BY TOPICAL ROUTE 4 TIMES PER DAY   diltiazem  (CARDIZEM  CD) 120 MG 24 hr capsule TAKE 1 CAPSULE EVERY DAY (NEED MD APPOINTMENT FOR REFILLS) (Patient taking differently: Take 120 mg by mouth at bedtime.)   diltiazem  (CARDIZEM ) 30 MG tablet Take 1 tablet every 4 hours AS NEEDED for heart rate >90   ezetimibe (ZETIA) 10 MG tablet Take 10 mg by mouth at bedtime.   furosemide  (LASIX ) 20 MG tablet Take 1 tablet (20 mg total) by mouth daily as needed for edema.   gabapentin  (NEURONTIN ) 100 MG capsule Take 1 capsule (100 mg total) by mouth at bedtime.   Galcanezumab -gnlm (EMGALITY ) 120 MG/ML SOAJ Inject 120 mg into the skin every 30 (thirty) days.   lamoTRIgine  (LAMICTAL ) 100 MG tablet Take 1 tablet (100 mg total) by mouth daily.   meloxicam (MOBIC) 7.5 MG tablet Take 7.5 mg by mouth 2 (two) times daily.   nicotine  (NICODERM CQ  - DOSED IN MG/24 HOURS) 14 mg/24hr patch RX #1 Weeks 1-6: 14 mg x 1 patch daily. Wear for 24 hours. If you have sleep disturbances, remove at bedtime.   ondansetron  (ZOFRAN -ODT) 4 MG disintegrating tablet Take 4 mg by mouth every 8 (eight) hours as needed for vomiting or nausea.   risperiDONE  (RISPERDAL ) 1 MG tablet Take 1 tablet (1 mg total) by mouth at bedtime.   Vitamin D , Ergocalciferol , (DRISDOL ) 1.25 MG (50000 UNIT) CAPS capsule Take  1 capsule (50,000 Units total) by mouth every 7 (seven) days. (Patient taking differently: Take 50,000 Units by mouth every Friday.)   No facility-administered encounter medications on file as of 10/06/2023.    Allergies as of 10/06/2023 - Review Complete 10/06/2023  Allergen Reaction Noted   Augmentin [amoxicillin-pot clavulanate] Other (See Comments) 07/03/2017   Sumatriptan Other (See Comments) 04/24/2019   Doxycycline hyclate Hives and Rash 07/23/2009    Past Medical History:  Diagnosis Date   Atrial fibrillation [facility])    DEGENERATIVE JOINT DISEASE 11/12/2006   DISORDER, BIPOLAR NOS 02/28/2007   GERD 11/12/2006    Major depressive disorder, single episode, unspecified 07/15/2021    Past Surgical History:  Procedure Laterality Date   ABDOMINAL HYSTERECTOMY     ATRIAL FIBRILLATION ABLATION N/A 07/26/2023   Procedure: ATRIAL FIBRILLATION ABLATION;  Surgeon: Efraim Grange, MD;  Location: [facility] INVASIVE CV LAB;  Service: Cardiovascular;  Laterality: N/A;   BREAST SURGERY     reduction   KNEE ARTHROSCOPY     TONSILLECTOMY      Family History  Problem Relation Age of Onset   Dementia Mother    Congestive Heart Failure Father    Migraines Father    Dementia Maternal Aunt    Dementia Maternal Aunt    Dementia Maternal Grandmother     Social History   Socioeconomic History   Marital status: Married    Spouse name: Ambrosio Junker   Number of children: 1   Years of education: Not on file   Highest education level: Not on file  Occupational History   Not on file  Tobacco Use   Smoking status: Former    Current packs/day: 0.50    Average packs/day: 0.5 packs/day for 45.0 years (22.5 ttl pk-yrs)    Types: Cigarettes   Smokeless tobacco: Never   Tobacco comments:    Quit smoking 07/2023!  Vaping Use   Vaping status: Never Used  Substance and Sexual Activity   Alcohol use: No   Drug use: Yes    Frequency: 7.0 times per week    Types: Marijuana    Comment: daily marijuana use   Sexual activity: Not Currently    Partners: Male  Other Topics Concern   Not on file  Social History Narrative   Not on file   Social Drivers of Health   Financial Resource Strain: Low Risk  (09/07/2023)   Overall Financial Resource Strain (CARDIA)    Difficulty of Paying Living Expenses: Not hard at all  Food Insecurity: No Food Insecurity (09/07/2023)   Hunger Vital Sign    Worried About Running Out of Food in the Last Year: Never true    Ran Out of Food in the Last Year: Never true  Transportation Needs: No Transportation Needs (09/07/2023)   PRAPARE - Administrator, Civil Service (Medical): No    Lack  of Transportation (Non-Medical): No  Physical Activity: Inactive (09/07/2023)   Exercise Vital Sign    Days of Exercise per Week: 0 days    Minutes of Exercise per Session: 0 min  Stress: No Stress Concern Present (09/07/2023)   [employer] of Occupational Health - Occupational Stress Questionnaire    Feeling of Stress : Only a little  Social Connections: Moderately Isolated (09/07/2023)   Social Connection and Isolation Panel [NHANES]    Frequency of Communication with Friends and Family: More than three times a week    Frequency of Social Gatherings with Friends and Family: Not on file  Attends Religious Services: Never    Active Member of Clubs or Organizations: No    Attends Banker Meetings: Never    Marital Status: Married  Catering manager Violence: Not At Risk (09/07/2023)   Humiliation, Afraid, Rape, and Kick questionnaire    Fear of Current or Ex-Partner: No    Emotionally Abused: No    Physically Abused: No    Sexually Abused: No    Review of Systems  Constitutional:  Negative for fatigue.  Respiratory:  Positive for cough. Negative for shortness of breath.   Neurological:  Positive for headaches.  Psychiatric/Behavioral:  Positive for sleep disturbance.     Vitals:   10/06/23 1501  BP: 122/67  Pulse: 70  Temp: 98.1 F (36.7 C)  SpO2: 97%     Physical Exam Constitutional:      Appearance: Normal appearance.  HENT:     Head: Normocephalic.     Nose: Nose normal.     Mouth/Throat:     Mouth: Mucous membranes are moist.  Eyes:     General: No scleral icterus. Cardiovascular:     Rate and Rhythm: Normal rate and regular rhythm.     Heart sounds: No murmur heard.    No friction rub.  Pulmonary:     Effort: No respiratory distress.     Breath sounds: No stridor. No wheezing or rhonchi.  Musculoskeletal:     Cervical back: No rigidity or tenderness.  Neurological:     Mental Status: She is alert.  Psychiatric:        Mood and Affect:  Mood normal.       10/06/2023    3:00 PM  Results of the Epworth flowsheet  Sitting and reading 2  Watching TV 1  Sitting, inactive in a public place (e.g. a theatre or a meeting) 0  As a passenger in a car for an hour without a break 0  Lying down to rest in the afternoon when circumstances permit 0  Sitting and talking to someone 0  Sitting quietly after a lunch without alcohol 0  In a car, while stopped for a few minutes in traffic 0  Total score 3    Data Reviewed: Recent cardiac CT 07/02/2023 with evidence of emphysema,  Assessment:  Concern for sleep disordered breathing  Persistent migraine headaches  Past history of significant smoking  Chronic obstructive pulmonary disease  Nonrestorative sleep  Nonproductive cough  Shortness of breath with activity  Chronic back pain  Plan/Recommendations: Continue albuterol  Schedule for pulmonary function test  Schedule for in-lab sleep study - She is on buprenorphine  -naloxone  , baclofen, Neurontin   A home sleep study may need central sleep apnea, may also be suboptimal as she is not very symptomatic, need to perform a gold standard study to rule out significant sleep disordered breathing   Myer Artis MD [facility] 10/06/2023, 3:09 PM  CC: Early, Sara E, NP

## 2023-10-06 NOTE — Patient Instructions (Signed)
 We will see you in about 8 weeks  Continue using your albuterol  Schedule you for breathing study  Schedule for in-lab sleep study

## 2023-10-06 NOTE — Assessment & Plan Note (Signed)
 COPD with improved respiratory function following smoking cessation. Reduced inhaler use and anticipated lung function improvement as lungs heal. Discussed potential for increased cough as lungs clear debris. - Recommend Mucinex as needed for mucus clearance. - Encourage increased water intake to aid mucus clearance.

## 2023-10-07 LAB — HM MAMMOGRAPHY

## 2023-10-07 LAB — HM DEXA SCAN

## 2023-10-11 ENCOUNTER — Encounter: Payer: Self-pay | Admitting: Neurology

## 2023-10-11 ENCOUNTER — Other Ambulatory Visit: Payer: Self-pay | Admitting: Obstetrics and Gynecology

## 2023-10-11 DIAGNOSIS — Z13820 Encounter for screening for osteoporosis: Secondary | ICD-10-CM

## 2023-10-25 ENCOUNTER — Ambulatory Visit: Payer: Medicare HMO | Admitting: Physician Assistant

## 2023-10-25 NOTE — Progress Notes (Signed)
 Cardiology Office Note:  .   Date:  10/26/2023  ID:  Amanda [name], DOB 1955-12-29, MRN [id number] PCP: Amanda Kief, NP  [facility] Providers Cardiologist:  Amanda Angel, MD Electrophysiologist:  Amanda Grange, MD {  History of Present Illness: .   Amanda [name] is a 68 y.o. female w/PMHx of  COPD, smoker, bipolar d/o AFib  Referred to Dr. Arlester [name] Jan 2025 for consideration of ablation, she was in AFib and unaware.  Discussed management strategies, planned for ablation  Ablation 07/26/23  Saw the AFib clinic 08/23/23, no symptoms of AFib, no site concerns.  Had quit smoking!  Saw pulm team 10/06/23, planned for sleep study  Today's visit is scheduled as her post ablation visit  ROS:   She is doing well She did not have any overt palpitations or cardiac awareness when she saw Dr. Arlester [name] pre-ablation, but recalls having a sense of feeling off/or jittery. She does not think she has had any AFib post ablation No CP, SOB No near syncope or syncope No groin complications No bleeding or signs of bleeding but gets big bruises on her arms easily She hopes to to be able to come off some meds  PMHx reviewed today She denies HTN, DM No hx of CHF, stoke/TIA, vascular disease   Arrhythmia/AAD hx AFib ~ 2022 PVI ablation 07/26/23 No AAD to date  Note she is on a few QT prolonging agents, prior pharmacy team review: Flecainide will interact with celexa  and risperidone ,  multaq is contraindicated with celexa  and also interacts with risperidone . Celexa  is one of the most qtc prolonging SSRIs,   Studies Reviewed: Amanda [name]    EKG done today and reviewed by myself:  SR 63bpm  07/26/23: EPS/ablation CONCLUSIONS: 1. Sinus rhythm upon presentation.   2. Successful ablation of all four pulmonary veins with pulsed field energy. 3. Successful ablation of the posterior wall of the left atrium with pulsed field energy 4. No inducible arrhythmias following ablation  5. No  early apparent complications.  07/03/23: cardiac CT IMPRESSION: 1. There is normal pulmonary vein drainage into the left atrium. 2. There is no thrombus in the left atrial appendage on the delayed images. 3. The esophagus runs in the left atrial midline and is not in proximity to any of the pulmonary vein ostia. 4. No PFO / ASD. 5. Aortic atherosclerosis. 5. Normal coronary origin. Right dominance. 6. CAC score of 1 which is 52nd percentile for age-, race-, and sex-matched controls.  TTE June 2022 EF 60 to 65%.  Normal biatrial size.  Coronary CT May 2022 Coronary calcium score is 0  Risk Assessment/Calculations:    Physical Exam:   VS:  BP 104/60   Pulse 63   Ht 5\' 3"  (1.6 m)   Wt 167 lb (75.8 kg)   SpO2 95%   BMI 29.58 kg/m    Wt Readings from Last 3 Encounters:  10/26/23 167 lb (75.8 kg)  10/06/23 167 lb 3.2 oz (75.8 kg)  09/28/23 165 lb 6.4 oz (75 kg)    GEN: Well nourished, well developed in no acute distress NECK: No JVD; No carotid bruits CARDIAC: RRR, no murmurs, rubs, gallops RESPIRATORY:   CTA b/l without rales, wheezing or rhonchi  ABDOMEN: Soft, non-tender, non-distended EXTREMITIES:  No edema; No deformity   ASSESSMENT AND PLAN: .    paroxysmal AFib CHA2DS2Vasc is one ( 2 including gender) We discussed to day at length her risk score and guidelines/recommendations She would really prefer  to come off Eliquis  She is going to order the [facility] to monitor rhythm Will also stop diltiazem    Dispo: back in 3 mo, sooner if needed  Signed, Debbie Fails, PA-C

## 2023-10-26 ENCOUNTER — Ambulatory Visit: Attending: Physician Assistant | Admitting: Physician Assistant

## 2023-10-26 VITALS — BP 104/60 | HR 63 | Ht 63.0 in | Wt 167.0 lb

## 2023-10-26 DIAGNOSIS — I480 Paroxysmal atrial fibrillation: Secondary | ICD-10-CM | POA: Diagnosis not present

## 2023-10-26 NOTE — Patient Instructions (Addendum)
 Medication Instructions:   STOP TAKING AND REMOVE THIS MEDICATION FROM YOUR MEDICATION LIST:  ELIQUIS  AND  DILTIAZEM   120 MG    *If you need a refill on your cardiac medications before your next appointment, please call your pharmacy*   Lab Work: NONE ORDERED  TODAY    If you have labs (blood work) drawn today and your tests are completely normal, you will receive your results only by: MyChart Message (if you have MyChart) OR A paper copy in the mail If you have any lab test that is abnormal or we need to change your treatment, we will call you to review the results.   Testing/Procedures: NONE ORDERED  TODAY    Follow-Up:  At [facility], you and your health needs are our priority.  As part of our continuing mission to provide you with exceptional heart care, our providers are all part of one team.  This team includes your primary Cardiologist (physician) and Advanced Practice Providers or APPs (Physician Assistants and Nurse Practitioners) who all work together to provide you with the care you need, when you need it.   Your next appointment:    3 month(s)  ( CONTACT  CASSIE HALL/ ANGELINE HAMMER FOR EP SCHEDULING ISSUES )    Provider:    You may see Efraim Grange, MD or one of the following Advanced Practice Providers on your designated re Team:   Mertha Abrahams, PA-C    We recommend signing up for the patient portal called "MyChart".  Sign up information is provided on this After Visit Summary.  MyChart is used to connect with patients for Virtual Visits (Telemedicine).  Patients are able to view lab/test results, encounter notes, upcoming appointments, etc.  Non-urgent messages can be sent to your provider as well.   To learn more about what you can do with MyChart, go to [email or web address].   Other Instructions

## 2023-11-16 ENCOUNTER — Ambulatory Visit (HOSPITAL_BASED_OUTPATIENT_CLINIC_OR_DEPARTMENT_OTHER): Admitting: Pulmonary Disease

## 2023-11-16 ENCOUNTER — Encounter (HOSPITAL_BASED_OUTPATIENT_CLINIC_OR_DEPARTMENT_OTHER): Payer: Self-pay | Admitting: *Deleted

## 2023-11-16 DIAGNOSIS — J431 Panlobular emphysema: Secondary | ICD-10-CM

## 2023-11-16 LAB — PULMONARY FUNCTION TEST
DL/VA % pred: 102 %
DL/VA: 4.32 ml/min/mmHg/L
DLCO cor % pred: 103 %
DLCO cor: 19.87 ml/min/mmHg
DLCO unc % pred: 103 %
DLCO unc: 19.87 ml/min/mmHg
FEF 25-75 Post: 2.09 L/s
FEF 25-75 Pre: 1.91 L/s
FEF2575-%Change-Post: 9 %
FEF2575-%Pred-Post: 100 %
FEF2575-%Pred-Pre: 92 %
FEV1-%Change-Post: 3 %
FEV1-%Pred-Post: 98 %
FEV1-%Pred-Pre: 95 %
FEV1-Post: 2.29 L
FEV1-Pre: 2.22 L
FEV1FVC-%Change-Post: -4 %
FEV1FVC-%Pred-Pre: 99 %
FEV6-%Change-Post: 8 %
FEV6-%Pred-Post: 106 %
FEV6-%Pred-Pre: 98 %
FEV6-Post: 3.11 L
FEV6-Pre: 2.87 L
FEV6FVC-%Pred-Post: 104 %
FEV6FVC-%Pred-Pre: 104 %
FVC-%Change-Post: 7 %
FVC-%Pred-Post: 102 %
FVC-%Pred-Pre: 95 %
FVC-Post: 3.11 L
FVC-Pre: 2.89 L
Post FEV1/FVC ratio: 74 %
Post FEV6/FVC ratio: 100 %
Pre FEV1/FVC ratio: 77 %
Pre FEV6/FVC Ratio: 100 %
RV % pred: 126 %
RV: 2.58 L
TLC % pred: 113 %
TLC: 5.59 L

## 2023-11-16 NOTE — Patient Instructions (Signed)
 Full PFT performed today.

## 2023-11-16 NOTE — Progress Notes (Signed)
 Full PFT performed today.

## 2023-11-19 ENCOUNTER — Encounter: Payer: Self-pay | Admitting: Neurology

## 2023-12-16 ENCOUNTER — Encounter (HOSPITAL_BASED_OUTPATIENT_CLINIC_OR_DEPARTMENT_OTHER): Admitting: Pulmonary Disease

## 2023-12-21 ENCOUNTER — Ambulatory Visit (INDEPENDENT_AMBULATORY_CARE_PROVIDER_SITE_OTHER): Admitting: Pulmonary Disease

## 2023-12-21 VITALS — BP 146/73 | HR 67 | Ht 63.0 in | Wt 168.0 lb

## 2023-12-21 DIAGNOSIS — J431 Panlobular emphysema: Secondary | ICD-10-CM | POA: Diagnosis not present

## 2023-12-21 DIAGNOSIS — Z87891 Personal history of nicotine dependence: Secondary | ICD-10-CM | POA: Diagnosis not present

## 2023-12-21 NOTE — Progress Notes (Signed)
 Amanda [name]    [id number]    01-01-1956  Primary Care Physician:Early, Camie BRAVO, NP  Referring Physician: Oris Camie BRAVO, NP [address] [place],  [place] [address]  Chief complaint:   In for follow-up Breathing has been stable  HPI:  Being evaluated by neurology for her migraines Concern for obstructive sleep apnea  Admits to snoring Admits to dryness of her mouth in the mornings No morning sore throat Memory is poor Has not been told about apneas, No gasping respirations  Continues to follow-up with neurology for her migraines  Shortness of breath is better Rarely needs albuterol  Tries to stay active  90-pack-year smoking history Does emphysema  Activity level limited by chronic back pain Has spinal stenosis can walk long distances - Has been on meloxicam  Usually tries to go to bed by 11, takes about 5 minutes to fall asleep Up to 3 awakenings Final wake up time about 9 AM  Weight has been stable History of obstructive lung disease - Uses albuterol as needed  Outpatient Encounter Medications as of 12/21/2023  Medication Sig   albuterol (VENTOLIN HFA) 108 (90 Base) MCG/ACT inhaler Inhale 2 puffs into the lungs every 6 (six) hours as needed for shortness of breath or wheezing.   baclofen (LIORESAL) 10 MG tablet Take 10 mg by mouth in the morning.   Buprenorphine  HCl-Naloxone  HCl 1.4-0.36 MG SUBL Place 1-2 tablets under the tongue See admin instructions. Take 2 tablets by mouth in the morning and take 1 tablet in afternoon   citalopram  (CELEXA ) 20 MG tablet Take 1 tablet (20 mg total) by mouth at bedtime.   diclofenac Sodium (VOLTAREN) 1 % GEL APPLY 2 GRAMS TO THE AFFECTED AREA(S) BY TOPICAL ROUTE 4 TIMES PER DAY   diltiazem  (CARDIZEM ) 30 MG tablet Take 1 tablet every 4 hours AS NEEDED for heart rate >90   ezetimibe (ZETIA) 10 MG tablet Take 10 mg by mouth at bedtime.   furosemide  (LASIX ) 20 MG tablet Take 1 tablet (20 mg total) by  mouth daily as needed for edema.   gabapentin  (NEURONTIN ) 100 MG capsule Take 1 capsule (100 mg total) by mouth at bedtime.   Galcanezumab -gnlm (EMGALITY ) 120 MG/ML SOAJ Inject 120 mg into the skin every 30 (thirty) days.   lamoTRIgine  (LAMICTAL ) 100 MG tablet Take 1 tablet (100 mg total) by mouth daily.   meloxicam (MOBIC) 7.5 MG tablet Take 7.5 mg by mouth daily. Take an extra one as needed   ondansetron  (ZOFRAN -ODT) 4 MG disintegrating tablet Take 4 mg by mouth every 8 (eight) hours as needed for vomiting or nausea.   risperiDONE  (RISPERDAL ) 1 MG tablet Take 1 tablet (1 mg total) by mouth at bedtime.   Vitamin D , Ergocalciferol , (DRISDOL ) 1.25 MG (50000 UNIT) CAPS capsule Take 1 capsule (50,000 Units total) by mouth every 7 (seven) days. (Patient taking differently: Take 50,000 Units by mouth every Friday.)   ciclopirox  (PENLAC ) 8 % solution Apply topically at bedtime. Apply over nail and surrounding skin. Apply daily over previous coat. After seven (7) days, may remove with alcohol and continue cycle. (Patient not taking: Reported on 12/21/2023)   nicotine  (NICODERM CQ  - DOSED IN MG/24 HOURS) 14 mg/24hr patch RX #1 Weeks 1-6: 14 mg x 1 patch daily. Wear for 24 hours. If you have sleep disturbances, remove at bedtime. (Patient not taking: Reported on 12/21/2023)   No facility-administered encounter medications on file as of 12/21/2023.  Allergies as of 12/21/2023 - Review Complete 12/21/2023  Allergen Reaction Noted   Augmentin [amoxicillin-pot clavulanate] Other (See Comments) 07/03/2017   Sumatriptan Other (See Comments) 04/24/2019   Doxycycline hyclate Hives and Rash 07/23/2009    Past Medical History:  Diagnosis Date   Atrial fibrillation [facility])    DEGENERATIVE JOINT DISEASE 11/12/2006   DISORDER, BIPOLAR NOS 02/28/2007   GERD 11/12/2006   Major depressive disorder, single episode, unspecified 07/15/2021    Past Surgical History:  Procedure Laterality Date   ABDOMINAL HYSTERECTOMY      ATRIAL FIBRILLATION ABLATION N/A 07/26/2023   Procedure: ATRIAL FIBRILLATION ABLATION;  Surgeon: Nancey Eulas BRAVO, MD;  Location: [facility] IN[facility]AS[facility];  Service: Cardiovascular;  Laterality: N/A;   BREAST SURGERY     reduction   KNEE ARTHROSCOPY     TONSILLECTOMY      Family History  Problem Relation Age of Onset   Dementia Mother    Congestive Heart Failure Father    Migraines Father    Dementia Maternal Aunt    Dementia Maternal Aunt    Dementia Maternal Grandmother     Social History   Socioeconomic History   Marital status: Married    Spouse name: Arley   Number of children: 1   Years of education: Not on file   Highest education level: Not on file  Occupational History   Not on file  Tobacco Use   Smoking status: Former    Current packs/day: 0.00    Average packs/day: 2.0 packs/day for 45.0 years (90.0 ttl pk-yrs)    Types: Cigarettes    Quit date: 2025    Years since quitting: 0.5   Smokeless tobacco: Never   Tobacco comments:    Quit smoking 07/2023!  Vaping Use   Vaping status: Never Used  Substance and Sexual Activity   Alcohol use: No   Drug use: Yes    Frequency: 7.0 times per week    Types: Marijuana    Comment: daily marijuana use   Sexual activity: Not Currently    Partners: Male  Other Topics Concern   Not on file  Social History Narrative   Not on file   Social Drivers of Health   Financial Resource Strain: Low Risk  (09/07/2023)   Overall Financial Resource Strain (CARDIA)    Difficulty of Paying Living Expenses: Not hard at all  Food Insecurity: No Food Insecurity (09/07/2023)   Hunger Vital Sign    Worried About Running Out of Food in the Last Year: Never true    Ran Out of Food in the Last Year: Never true  Transportation Needs: No Transportation Needs (09/07/2023)   PRAPARE - Administrator, Civil Service (Medical): No    Lack of Transportation (Non-Medical): No  Physical Activity: Inactive (09/07/2023)   Exercise Vital Sign     Days of Exercise per Week: 0 days    Minutes of Exercise per Session: 0 min  Stress: No Stress Concern Present (09/07/2023)   [employer] of Occupational Health - Occupational Stress Questionnaire    Feeling of Stress : Only a little  Social Connections: Moderately Isolated (09/07/2023)   Social Connection and Isolation Panel    Frequency of Communication with Friends and Family: More than three times a week    Frequency of Social Gatherings with Friends and Family: Not on file    Attends Religious Services: Never    Active Member of Clubs or Organizations: No    Attends Club or  Organization Meetings: Never    Marital Status: Married  Catering manager Violence: Not At Risk (09/07/2023)   Humiliation, Afraid, Rape, and Kick questionnaire    Fear of Current or Ex-Partner: No    Emotionally Abused: No    Physically Abused: No    Sexually Abused: No    Review of Systems  Constitutional:  Negative for fatigue.  Respiratory:  Positive for cough. Negative for shortness of breath.   Neurological:  Positive for headaches.  Psychiatric/Behavioral:  Positive for sleep disturbance.     Vitals:   12/21/23 1442  BP: (!) 146/73  Pulse: 67  SpO2: 96%     Physical Exam Constitutional:      Appearance: Normal appearance.  HENT:     Head: Normocephalic.     Nose: Nose normal.     Mouth/Throat:     Mouth: Mucous membranes are moist.  Eyes:     General: No scleral icterus. Cardiovascular:     Rate and Rhythm: Normal rate and regular rhythm.     Heart sounds: No murmur heard.    No friction rub.  Pulmonary:     Effort: No respiratory distress.     Breath sounds: No stridor. No wheezing or rhonchi.  Musculoskeletal:     Cervical back: No rigidity or tenderness.  Neurological:     Mental Status: She is alert.  Psychiatric:        Mood and Affect: Mood normal.       10/06/2023    3:00 PM  Results of the Epworth flowsheet  Sitting and reading 2  Watching TV 1  Sitting,  inactive in a public place (e.g. a theatre or a meeting) 0  As a passenger in a car for an hour without a break 0  Lying down to rest in the afternoon when circumstances permit 0  Sitting and talking to someone 0  Sitting quietly after a lunch without alcohol 0  In a car, while stopped for a few minutes in traffic 0  Total score 3    Data Reviewed: Recent cardiac CT 07/02/2023 with evidence of emphysema,  Assessment:  Migraine headaches - Continue to follow-up with neurology  Obstructive lung disease - Emphysema on CT scan - PFTs relatively normal with no significant obstruction, no significant bronchodilator response, TLC is elevated with increased residual volume which will be consistent with some degree of emphysema, normal diffusing capacity   Nonrestrictive sleep, shortness of breath with activity Nonproductive cough  Shortness of breath with activity  Chronic back pain  Plan/Recommendations: Continue albuterol  Graded exercise as tolerated  Continue monitoring for migraine  Discussed sleep study during her last visit  Follow-up a year from now  Encouraged to call with significant concerns  Jennet Epley MD [facility] 12/21/2023, 3:02 PM  CC: Early, Sara E, NP

## 2023-12-21 NOTE — Patient Instructions (Signed)
 Your breathing study looks good  Use albuterol as needed  Regular exercises helps everyone  Call us  with significant concerns  Follow-up a year from now

## 2023-12-28 ENCOUNTER — Ambulatory Visit: Payer: Self-pay

## 2023-12-28 NOTE — Telephone Encounter (Signed)
 FYI Only or Action Required?: FYI only for provider.  Patient was last seen in primary care on 09/28/2023 by Early, Camie BRAVO, NP.  Called Nurse Triage reporting Headache.  Symptoms began several years ago.  Interventions attempted: OTC medications: Meloxicam/Excedrin.  Symptoms are: gradually worsening.  Triage Disposition: See Physician Within 24 Hours  Patient/caregiver understands and will follow disposition?: Yes  **Appt. Scheduled for 7/23**           Copied from CRM [id number]. Topic: Appointments - Appointment Scheduling >> Dec 28, 2023 12:21 PM Leonette SQUIBB wrote: Pt called saying she is having bad headaches and would like to speak to a nurse. Reason for Disposition  [1] MODERATE headache (e.g., interferes with normal activities) AND [2] present > 24 hours AND [3] unexplained  (Exceptions: Pain medicines not tried, typical migraine, or headache part of viral illness.)  Answer Assessment - Initial Assessment Questions 1. LOCATION: Where does it hurt?      Right side  2. ONSET: When did the headache start? (e.g., minutes, hours, days)      Chronic migraines for years.   3. PATTERN: Does the pain come and go, or has it been constant since it started?     Intermittent   4. SEVERITY: How bad is the pain? and What does it keep you from doing?  (e.g., Scale 1-10; mild, moderate, or severe)  8/10 last night, 6/10 currently       5. RECURRENT SYMPTOM: Have you ever had headaches before? If Yes, ask: When was the last time? and What happened that time?      Yes  6. CAUSE: What do you think is causing the headache?     Chronic migraines   7. MIGRAINE: Have you been diagnosed with migraine headaches? If Yes, ask: Is this headache similar?      Yes  8. HEAD INJURY: Has there been any recent injury to your head?      No   9. OTHER SYMPTOMS: Do you have any other symptoms? (e.g., fever, stiff neck, eye pain, sore throat, cold symptoms)     No    Is being seen by a Neurologist. Taking Meloxicam for the symptoms.  Protocols used: [facility]

## 2023-12-28 NOTE — Progress Notes (Unsigned)
 No chief complaint on file.  Patient has h/o migraines, for many years. She called yesterday to schedule appointment, had 8/10 HA the night prior, still had 6/10 HA when she called to schedule appointment. HA is right sided.  She is under the care of Dr. Darleen for chronic intractable migraines. She last saw her in 08/2023. She had done very well on Ajovy , but wasn't affordable.  Emgality  was preferred, and she was switched to this in March.  ***UPDATE--getting monthly emgality  injections??  She had sent her a message on 6/13 stating she has had terrible headache since PFT's.  She had taken meloxicam for a different issue back in May, and noted significant improvement in her headaches during that time.   PMH, PSH, SH reviewed Migraines, afib, COPD, bipolar  ROS:    PHYSICAL EXAM:  There were no vitals taken for this visit.      ASSESSMENT/PLAN:   ?getting monthly emgality  injections? Prev did well with meloxicam -- Consider toradol  if in a lot of pain  She has appt 9/9 with neuro.

## 2023-12-29 ENCOUNTER — Encounter: Payer: Self-pay | Admitting: Family Medicine

## 2023-12-29 ENCOUNTER — Telehealth: Payer: Self-pay

## 2023-12-29 ENCOUNTER — Ambulatory Visit (INDEPENDENT_AMBULATORY_CARE_PROVIDER_SITE_OTHER): Admitting: Family Medicine

## 2023-12-29 ENCOUNTER — Other Ambulatory Visit: Payer: Self-pay

## 2023-12-29 VITALS — BP 128/68 | HR 64 | Ht 62.0 in | Wt 167.8 lb

## 2023-12-29 DIAGNOSIS — Z5181 Encounter for therapeutic drug level monitoring: Secondary | ICD-10-CM

## 2023-12-29 DIAGNOSIS — R42 Dizziness and giddiness: Secondary | ICD-10-CM

## 2023-12-29 DIAGNOSIS — G43711 Chronic migraine without aura, intractable, with status migrainosus: Secondary | ICD-10-CM | POA: Diagnosis not present

## 2023-12-29 DIAGNOSIS — E559 Vitamin D deficiency, unspecified: Secondary | ICD-10-CM

## 2023-12-29 DIAGNOSIS — Z0000 Encounter for general adult medical examination without abnormal findings: Secondary | ICD-10-CM

## 2023-12-29 NOTE — Patient Instructions (Addendum)
 The risks of taking meloxicam daily are stomach ulcers, GI bleeding, and causing kidney and/or liver problems. If you need to take this chronically, you should take famotidine (pepcid) or prilosec OTC once daily to protect your stomach. We also will need to monitor your kidney and liver tests every 6 months--we are checking this today.  Since you recently got a prescription for meloxicam, you don't need one from me today. If it doesn't last until you see Dr. Darleen next, you can either contact us  or Dr. Kit for a refill. Continue to try and use just 1 pill daily, using the second (always with food) if needed.  It should stay in your system for 24 hours. Consider switching the time that you take it (to taking it with dinner vs breakfast) since your evening headaches seem the worst.  Please be sure to discuss other options for acute headache treatment at your visit. We also discussed potentially trying physical therapy again.  This could help both with your headaches, and they might also be able to help with your positional vertigo. Let us  know, or discuss with Dr. Darleen, for a referral when you are ready (you declined this today).

## 2023-12-29 NOTE — Telephone Encounter (Signed)
 Patient has evening appt on 04/13/24. Can you schedule labs for her to come in for labs early on a different date?

## 2023-12-30 ENCOUNTER — Ambulatory Visit: Payer: Self-pay | Admitting: Family Medicine

## 2023-12-30 LAB — COMPREHENSIVE METABOLIC PANEL WITH GFR
ALT: 10 IU/L (ref 0–32)
AST: 15 IU/L (ref 0–40)
Albumin: 4.3 g/dL (ref 3.9–4.9)
Alkaline Phosphatase: 84 IU/L (ref 44–121)
BUN/Creatinine Ratio: 21 (ref 12–28)
BUN: 18 mg/dL (ref 8–27)
Bilirubin Total: <0.2 mg/dL (ref 0.0–1.2)
CO2: 26 mmol/L (ref 20–29)
Calcium: 9.4 mg/dL (ref 8.7–10.3)
Chloride: 101 mmol/L (ref 96–106)
Creatinine, Ser: 0.85 mg/dL (ref 0.57–1.00)
Globulin, Total: 2.4 g/dL (ref 1.5–4.5)
Glucose: 89 mg/dL (ref 70–99)
Potassium: 4.7 mmol/L (ref 3.5–5.2)
Sodium: 143 mmol/L (ref 134–144)
Total Protein: 6.7 g/dL (ref 6.0–8.5)
eGFR: 76 mL/min/1.73

## 2024-01-02 ENCOUNTER — Other Ambulatory Visit: Payer: Self-pay | Admitting: Nurse Practitioner

## 2024-01-02 DIAGNOSIS — E559 Vitamin D deficiency, unspecified: Secondary | ICD-10-CM

## 2024-01-03 NOTE — Telephone Encounter (Signed)
 Does pt. Need to remain on this last apt 09/28/23.

## 2024-01-26 ENCOUNTER — Ambulatory Visit: Admitting: Cardiovascular Disease

## 2024-02-02 ENCOUNTER — Ambulatory Visit: Admitting: Cardiovascular Disease

## 2024-02-15 ENCOUNTER — Encounter: Payer: Self-pay | Admitting: Neurology

## 2024-02-15 ENCOUNTER — Ambulatory Visit: Admitting: Neurology

## 2024-02-15 VITALS — BP 125/64 | HR 68 | Ht 63.0 in | Wt 172.2 lb

## 2024-02-15 DIAGNOSIS — G43711 Chronic migraine without aura, intractable, with status migrainosus: Secondary | ICD-10-CM | POA: Diagnosis not present

## 2024-02-15 MED ORDER — EMGALITY 120 MG/ML ~~LOC~~ SOAJ: 120.0000 mg | SUBCUTANEOUS | 11 refills | Status: DC

## 2024-02-15 MED ORDER — EMGALITY 120 MG/ML ~~LOC~~ SOAJ: 120.0000 mg | SUBCUTANEOUS | Status: DC

## 2024-02-15 NOTE — Progress Notes (Unsigned)
 [facility]    Provider:  Dr Ines Requesting Provider: Early, Camie BRAVO, NP Primary Care Provider:  Oris Camie BRAVO, NP  CC:  Migraines  02/15/2024: Ajovy  not helping as well as it did.The headaches are bad in the evenings. Emgality  was approved in the past discussed retrying that.  Tried and failed multiple medications. Back to daily migraines and daily headaches > 3 months.  Will also rquest Botox for migraines. Daily migraines and daily headaches.  From a thorough review of records and patient report, Medications tried that can be used in migraine/headache management greater than 3 months include: Lifestyle modification, headache diaries, better sleep hygiene, exercise, management of migraine triggers, OTC and prescribed analgesics/nsaids such as ibuprofen, excedrin, alleve and others, propranolol contraindicated due to asthma/copd, baclofen, citalopram , amitriptyline/nortriptyline contraindicated due to already being on celexa  and the combination can cause seratonin syndrome, gabapentin , emgality , ajovy , lamictal , risperidone , qulipta , diltiazem , metoprolol , ondansetron , nurtec, ubrelvy , trazodone, zonisamide, blood pressure well controlled and so cannot add on other blood pressure medications due to risk of hypotension, topiramate contraindicated due to Topiramate may enhance the arrhythmogenic effect of lamotrigine , but it may also increase the elimination of lamotrigine , Also, additive CNS depressant effects may occur. Cannot increase gabapentin , taking during the day or increasing night dose has caused sedation.    08/10/2023: Ajovy  helped tremendously. She can't afford the medication. We could not get it approved. She has been suffering since the Ajovy  samples ran out. When on the Ajovy  she improved significantly and she felt so great she states she can;t live without a cgrp. Insurance covers Emgality  we will try and get that approved. Now back to daily migraines and daily  headaches. She has not been to get injections into the neck due to her afib s/p procedure. Hilarious funny patient stopped smoking was 3 packs a day. No other focal neurologic deficits, associated symptoms, inciting events or modifiable factors.  Patient complains of symptoms per HPI as well as the following symptoms: none . Pertinent negatives and positives per HPI. All others negative   HPI 03/24/2023:  Amanda [name] is a 68 y.o. female here as requested by Early, Camie BRAVO, NP for migraines.has Bipolar disorder (HCC); Gastroesophageal reflux disease; Osteoarthritis; Atrial fibrillation (HCC); COPD (chronic obstructive pulmonary disease) (HCC); Back pain; Carpal tunnel syndrome; Chronic pain; Hyperlipidemia, unspecified; Hypertension; Hypothyroid; History of smoking; Other long term (current) drug therapy; Prediabetes; Vitamin B12 deficiency (non anemic); Pruritic disorder; Numerous skin moles; Annular dermatitis; Anxiety; Easy bruising; Chronic migraine without aura, with intractable migraine, so stated, with status migrainosus; Leg swelling; Slow transit constipation; Diverticulosis; Fatty infiltration of liver; Fatty pancreas; Calcification of abdominal aorta (HCC); and Occipital neuralgia on their problem list.  Has a long history of migraines. Has been to Dr. Velinda many years ago, [facility] Oneita and multiple other physicians. She is taking excedrin migraine daily and we discussed medication overuse. The zonisamide did not help that Dr. Oneita tried but she has tried a plethora of medications over the decades. 16 severe migraines a mnth, 8 moderately severe headaches a month and 4 moderately severe migraines a month and 4 mild headaches a month. Pulsating and throbbing on the right, the parietal area, light and sound sensitivity, nausea, no vomiting. Zofran  helps with the nausea. Feels like it rolling in the head messing with her. She has ahd migraines all her lfe. She doe  snot wake up with them. Wakes up fine. But she  has COPD. She sits up in  recliner to sleep. Migraines ongoing for years. Affecting her life. She cannot afford the newer medications we will try her on samples and see if we can get her patient assistance. No other focal neurologic deficits, associated symptoms, inciting events or modifiable factors.   Reviewed notes, labs and imaging from outside physicians, which showed:  MRI brain:  CLINICAL DATA:  68 year old female with constant headaches since October 2023.   EXAM: MRI HEAD WITHOUT CONTRAST   TECHNIQUE: Multiplanar, multiecho pulse sequences of the brain and surrounding structures were obtained without intravenous contrast.   COMPARISON:  None Available.   FINDINGS: Brain: Cerebral volume is within normal limits for age. No restricted diffusion to suggest acute infarction. No midline shift, mass effect, evidence of mass lesion, ventriculomegaly, extra-axial collection or acute intracranial hemorrhage. Cervicomedullary junction and pituitary are within normal limits. Elnor and white matter signal is within normal limits for age throughout the brain. No encephalomalacia or chronic cerebral blood products identified. Deep gray nuclei, brainstem, and cerebellum appear normal.   Vascular: Major intracranial vascular flow voids are preserved. The distal right vertebral artery appears dominant, normal variant.   Skull and upper cervical spine: Some cervical spine disc degeneration is evident, but overall within normal limits for age. Visualized bone marrow signal is within normal limits.   Sinuses/Orbits: Orbits appear symmetric and normal. Paranasal sinuses and mastoids are well aerated.   Other: Grossly normal visible internal auditory structures. Negative visible scalp and face.   IMPRESSION: No acute intracranial abnormality and normal for age noncontrast MRI appearance of the Brain.     Electronically Signed   By: VEAR Hurst M.D.   On: 06/25/2022 13:50     Latest Ref Rng & Units 06/30/2023    3:45 PM 06/30/2023    3:43 PM 06/14/2023    2:58 PM  CBC  WBC 3.4 - 10.8 x10E3/uL 8.6  8.9  19.7   Hemoglobin 11.1 - 15.9 g/dL 87.5  87.6  84.4   Hematocrit 34.0 - 46.6 % 40.3  39.8  49.2   Platelets 150 - 450 x10E3/uL 267  270  419       Latest Ref Rng & Units 12/29/2023   11:57 AM 06/30/2023    3:46 PM 06/14/2023    3:02 PM  CMP  Glucose 70 - 99 mg/dL 89  894  892   BUN 8 - 27 mg/dL 18  17  21    Creatinine 0.57 - 1.00 mg/dL 9.14  9.06  8.85   Sodium 134 - 144 mmol/L 143  142  143   Potassium 3.5 - 5.2 mmol/L 4.7  4.5  4.6   Chloride 96 - 106 mmol/L 101  101  98   CO2 20 - 29 mmol/L 26  26  30    Calcium 8.7 - 10.3 mg/dL 9.4  9.0  9.7   Total Protein 6.0 - 8.5 g/dL 6.7     Total Bilirubin 0.0 - 1.2 mg/dL <9.7     Alkaline Phos 44 - 121 IU/L 84     AST 0 - 40 IU/L 15     ALT 0 - 32 IU/L 10        Review of Systems: Patient complains of symptoms per HPI as well as the following symptoms none. Pertinent negatives and positives per HPI. All others negative.   Social History   Socioeconomic History   Marital status: Married    Spouse name: Arley   Number of children: 1   Years of  education: Not on file   Highest education level: Some college, no degree  Occupational History   Not on file  Tobacco Use   Smoking status: Former    Current packs/day: 0.00    Average packs/day: 2.0 packs/day for 45.0 years (90.0 ttl pk-yrs)    Types: Cigarettes    Quit date: 2025    Years since quitting: 0.6   Smokeless tobacco: Never   Tobacco comments:    Quit smoking 07/2023!  Vaping Use   Vaping status: Never Used  Substance and Sexual Activity   Alcohol use: No   Drug use: Yes    Frequency: 7.0 times per week    Types: Marijuana    Comment: daily marijuana use   Sexual activity: Not Currently    Partners: Male  Other Topics Concern   Not on file  Social History Narrative   Not on file   Social Drivers  of Health   Financial Resource Strain: Low Risk  (12/28/2023)   Overall Financial Resource Strain (CARDIA)    Difficulty of Paying Living Expenses: Not hard at all  Food Insecurity: No Food Insecurity (12/28/2023)   Hunger Vital Sign    Worried About Running Out of Food in the Last Year: Never true    Ran Out of Food in the Last Year: Never true  Transportation Needs: No Transportation Needs (12/28/2023)   PRAPARE - Administrator, Civil Service (Medical): No    Lack of Transportation (Non-Medical): No  Physical Activity: Insufficiently Active (12/28/2023)   Exercise Vital Sign    Days of Exercise per Week: 3 days    Minutes of Exercise per Session: 30 min  Stress: No Stress Concern Present (12/28/2023)   [employer] of Occupational Health - Occupational Stress Questionnaire    Feeling of Stress: Not at all  Social Connections: Moderately Isolated (12/28/2023)   Social Connection and Isolation Panel    Frequency of Communication with Friends and Family: More than three times a week    Frequency of Social Gatherings with Friends and Family: Once a week    Attends Religious Services: Never    Database administrator or Organizations: No    Attends Engineer, structural: Not on file    Marital Status: Married  Catering manager Violence: Not At Risk (09/07/2023)   Humiliation, Afraid, Rape, and Kick questionnaire    Fear of Current or Ex-Partner: No    Emotionally Abused: No    Physically Abused: No    Sexually Abused: No    Family History  Problem Relation Age of Onset   Dementia Mother    Congestive Heart Failure Father    Migraines Father    Dementia Maternal Aunt    Dementia Maternal Aunt    Dementia Maternal Grandmother     Past Medical History:  Diagnosis Date   Arthritis    R foot and L knee   Atrial fibrillation (HCC)    DEGENERATIVE JOINT DISEASE 11/12/2006   DISORDER, BIPOLAR NOS 02/28/2007   GERD 11/12/2006   Major depressive disorder,  single episode, unspecified 07/15/2021    Patient Active Problem List   Diagnosis Date Noted   Occipital neuralgia 10/06/2023   Leg swelling 06/08/2023   Slow transit constipation 06/08/2023   Diverticulosis 06/08/2023   Fatty infiltration of liver 06/08/2023   Fatty pancreas 06/08/2023   Calcification of abdominal aorta (HCC) 06/08/2023   Easy bruising 01/17/2023   Chronic migraine without aura, with intractable migraine, so  stated, with status migrainosus 01/17/2023   Anxiety 11/26/2022   Annular dermatitis 06/07/2022   Pruritic disorder 07/17/2021   Numerous skin moles 07/17/2021   COPD (chronic obstructive pulmonary disease) (HCC) 07/15/2021   Back pain 07/15/2021   Carpal tunnel syndrome 07/15/2021   Chronic pain 07/15/2021   Hyperlipidemia, unspecified 07/15/2021   Hypertension 07/15/2021   Hypothyroid 07/15/2021   History of smoking 07/15/2021   Other long term (current) drug therapy 07/15/2021   Vitamin B12 deficiency (non anemic) 07/15/2021   Prediabetes 04/24/2019   Atrial fibrillation (HCC) 07/03/2017   Bipolar disorder (HCC) 02/28/2007   Gastroesophageal reflux disease 11/12/2006   Osteoarthritis 11/12/2006    Past Surgical History:  Procedure Laterality Date   ABDOMINAL HYSTERECTOMY     ATRIAL FIBRILLATION ABLATION N/A 07/26/2023   Procedure: ATRIAL FIBRILLATION ABLATION;  Surgeon: Nancey Eulas BRAVO, MD;  Location: [facility];  Service: Cardiovascular;  Laterality: N/A;   BREAST SURGERY     reduction   KNEE ARTHROSCOPY     TONSILLECTOMY      Current Outpatient Medications  Medication Sig Dispense Refill   albuterol (VENTOLIN HFA) 108 (90 Base) MCG/ACT inhaler Inhale 2 puffs into the lungs every 6 (six) hours as needed for shortness of breath or wheezing.     baclofen (LIORESAL) 10 MG tablet Take 10 mg by mouth in the morning.     Buprenorphine  HCl-Naloxone  HCl 1.4-0.36 MG SUBL Place 1-2 tablets under the tongue See admin instructions. Take 2  tablets by mouth in the morning and take 1 tablet in afternoon     citalopram  (CELEXA ) 20 MG tablet Take 1 tablet (20 mg total) by mouth at bedtime. 90 tablet 2   ezetimibe (ZETIA) 10 MG tablet Take 10 mg by mouth at bedtime.     furosemide  (LASIX ) 20 MG tablet Take 1 tablet (20 mg total) by mouth daily as needed for edema. 30 tablet 3   gabapentin  (NEURONTIN ) 100 MG capsule Take 1 capsule (100 mg total) by mouth at bedtime. 90 capsule 2   Galcanezumab -gnlm (EMGALITY ) 120 MG/ML SOAJ Inject 120 mg into the skin every 30 (thirty) days. 4 mL    Galcanezumab -gnlm (EMGALITY ) 120 MG/ML SOAJ Inject 120 mg into the skin every 30 (thirty) days. 1 mL 11   lamoTRIgine  (LAMICTAL ) 100 MG tablet Take 1 tablet (100 mg total) by mouth daily. 90 tablet 2   meloxicam (MOBIC) 7.5 MG tablet Take 7.5 mg by mouth daily. Take an extra one as needed     ondansetron  (ZOFRAN -ODT) 4 MG disintegrating tablet Take 4 mg by mouth every 8 (eight) hours as needed for vomiting or nausea.     risperiDONE  (RISPERDAL ) 1 MG tablet Take 1 tablet (1 mg total) by mouth at bedtime. 90 tablet 2   Vitamin D , Ergocalciferol , (DRISDOL ) 1.25 MG (50000 UNIT) CAPS capsule Take 1 capsule by mouth once a week 12 capsule 3   No current facility-administered medications for this visit.    Allergies as of 02/15/2024 - Review Complete 02/15/2024  Allergen Reaction Noted   Augmentin [amoxicillin-pot clavulanate] Other (See Comments) 07/03/2017   Sumatriptan Other (See Comments) 04/24/2019   Doxycycline hyclate Hives and Rash 07/23/2009    Vitals: BP 125/64 (Cuff Size: Normal)   Pulse 68   Ht 5' 3 (1.6 m)   Wt 172 lb 3.2 oz (78.1 kg)   BMI 30.50 kg/m  Last Weight:  Wt Readings from Last 1 Encounters:  02/15/24 172 lb 3.2 oz (78.1 kg)  Last Height:   Ht Readings from Last 1 Encounters:  02/15/24 5' 3 (1.6 m)    Physical exam: Exam: Gen: NAD, conversant      CV: No palpitations or chest pain or SOB. VS: Breathing at a normal  rate. Not febrile. Eyes: Conjunctivae clear without exudates or hemorrhage  Neuro: Detailed Neurologic Exam  Speech:    Speech is normal; fluent and spontaneous with normal comprehension.  Cognition:    The patient is oriented to person, place, and time;     recent and remote memory intact;     language fluent;     normal attention, concentration, fund of knowledge Cranial Nerves:    The pupils are equal, round, and reactive to light. Visual fields are full Extraocular movements are intact.  The face is symmetric with normal sensation. The palate elevates in the midline. Hearing intact. Voice is normal. Shoulder shrug is normal. The tongue has normal motion without fasciculations.   Coordination: normal  Gait:    No abnormalities noted or reported  Motor Observation:   no involuntary movements noted. Tone:    Appears normal  Posture:    Posture is normal. normal erect    Strength:    Strength is anti-gravity and symmetric in the upper and lower limbs.      Sensation: intact to LT, no reports of numbness or tingling or paresthesias        Assessment/Plan:  Chronic intractable migraines, did extremely well on Ajovy  initially but now has become ineffective and daily migraines and headches for > 3 months.  Emgality  is preferred will retry  No medication overuse.  She is having daily migraines and daily headaches that are moderate to severe and can last all day for more than 3 months.  Start emgality , called pharmacy, $0 copay  From a thorough review of records and patient report, Medications tried that can be used in migraine/headache management greater than 3 months include: Lifestyle modification, headache diaries, better sleep hygiene, exercise, management of migraine triggers, OTC and prescribed analgesics/nsaids such as ibuprofen, excedrin, alleve and others, propranolol contraindicated due to asthma/copd, baclofen, citalopram , amitriptyline/nortriptyline contraindicated  due to already being on celexa  and the combination can cause seratonin syndrome, gabapentin , emgality , ajovy , lamictal , risperidone , qulipta , diltiazem , metoprolol , ondansetron , nurtec, ubrelvy , trazodone, zonisamide, blood pressure well controlled and so cannot add on other blood pressure medications due to risk of hypotension, topiramate contraindicated due to Topiramate may enhance the arrhythmogenic effect of lamotrigine , but it may also increase the elimination of lamotrigine , Also, additive CNS depressant effects may occur. Cannot increase gabapentin , taking during the day or increasing night dose has caused sedation.   Other medications we can try are aimovig, qulipta  was too expensive but samples worked great and we can retry to see if price improves or maybe a patient assistance program, ubrelvy  was too expensive, unclear what happened with nurtec or if it was even approved may consider that at a later time. Vyepti may be an option if we can get a patient assistance program, botox is something we could see what the price is.     No orders of the defined types were placed in this encounter.  Meds ordered this encounter  Medications   Galcanezumab -gnlm (EMGALITY ) 120 MG/ML SOAJ    Sig: Inject 120 mg into the skin every 30 (thirty) days.    Dispense:  4 mL   Galcanezumab -gnlm (EMGALITY ) 120 MG/ML SOAJ    Sig: Inject 120 mg into the skin every 30 (  thirty) days.    Dispense:  1 mL    Refill:  11    Cc: Early, Camie BRAVO, NP,  Early, Camie BRAVO, NP  Onetha Epp, MD  [facility] [address] [place], [place] [address]  Phone [phone] Fax [phone]  I spent 35 minutes of face-to-face and non-face-to-face time with patient on the  1. Chronic migraine without aura, with intractable migraine, so stated, with status migrainosus     diagnosis.  This included previsit chart review, lab review, study review, order entry, electronic health record  documentation, patient education on the different diagnostic and therapeutic options, counseling and coordination of care, risks and benefits of management, compliance, or risk factor reduction

## 2024-02-15 NOTE — Patient Instructions (Addendum)
 Start emgality      Galcanezumab  Injection What is this medication? GALCANEZUMAB  (gal ka NEZ ue mab) prevents migraines. It works by blocking a substance in the body that causes migraines. It may also be used to treat cluster headaches. It is a monoclonal antibody. This medicine may be used for other purposes; ask your health care provider or pharmacist if you have questions. COMMON BRAND NAME(S): Emgality  What should I tell my care team before I take this medication? They need to know if you have any of these conditions: Circulation problems in fingers or toes (Raynaud syndrome) High blood pressure An unusual or allergic reaction to galcanezumab , other medications, foods, dyes, or preservatives Pregnant or trying to get pregnant Breastfeeding How should I use this medication? This medication is injected under the skin. You will be taught how to prepare and give it. Take it as directed on the prescription label. Keep taking it unless your care team tells you to stop. It is important that you put your used needles and syringes in a special sharps container. Do not put them in a trash can. If you do not have a sharps container, call your pharmacist or care team to get one. Talk to your care team about the use of this medication in children. Special care may be needed. Overdosage: If you think you have taken too much of this medicine contact a poison control center or emergency room at once. NOTE: This medicine is only for you. Do not share this medicine with others. What if I miss a dose? If you miss a dose, take it as soon as you can. If it is almost time for your next dose, take only that dose. Do not take double or extra doses. What may interact with this medication? Interactions are not expected. This list may not describe all possible interactions. Give your health care provider a list of all the medicines, herbs, non-prescription drugs, or dietary supplements you use. Also tell them if  you smoke, drink alcohol, or use illegal drugs. Some items may interact with your medicine. What should I watch for while using this medication? Visit your care team for regular checks on your progress. Tell your care team if your symptoms do not start to get better or if they get worse. What side effects may I notice from receiving this medication? Side effects that you should report to your care team as soon as possible: Allergic reactions or angioedema--skin rash, itching or hives, swelling of the face, eyes, lips, tongue, arms, or legs, trouble swallowing or breathing Increase in blood pressure Raynaud syndrome--cool, numb, or painful fingers or toes that may change color from pale, to blue, to red Side effects that usually do not require medical attention (report these to your care team if they continue or are bothersome): Pain, redness, or irritation at injection site This list may not describe all possible side effects. Call your doctor for medical advice about side effects. You may report side effects to FDA at 1-800-FDA-1088. Where should I keep my medication? Keep out of the reach of children and pets. Store in a refrigerator or at room temperature between 20 and 25 degrees C (68 and 77 degrees F). Refrigeration (preferred): Store in the refrigerator. Do not freeze. Keep in the original container until you are ready to take it. Remove the dose from the carton about 30 minutes before it is time for you to use it. If the dose is not used, it may be stored in original  container at room temperature for 7 days. Get rid of any unused medication after the expiration date. Room Temperature: This medication may be stored at room temperature for up to 7 days. Keep it in the original container. Protect from light until time of use. If it is stored at room temperature, get rid of any unused medication after 7 days or after it expires, whichever is first. To get rid of medications that are no longer  needed or have expired: Take the medication to a medication take-back program. Check with your pharmacy or law enforcement to find a location. If you cannot return the medication, ask your pharmacist or care team how to get rid of this medication safely. NOTE: This sheet is a summary. It may not cover all possible information. If you have questions about this medicine, talk to your doctor, pharmacist, or health care provider.  2025 Elsevier/Gold Standard (2023-09-02 00:00:00)

## 2024-02-16 NOTE — Addendum Note (Signed)
 Addended by: Estephani Popper B on: 02/16/2024 12:56 PM   Modules accepted: Level of Service

## 2024-03-06 ENCOUNTER — Encounter: Payer: Self-pay | Admitting: Cardiovascular Disease

## 2024-03-06 ENCOUNTER — Ambulatory Visit: Attending: Cardiovascular Disease | Admitting: Cardiovascular Disease

## 2024-03-06 VITALS — BP 124/78 | HR 67 | Ht 63.0 in | Wt 171.0 lb

## 2024-03-06 DIAGNOSIS — I480 Paroxysmal atrial fibrillation: Secondary | ICD-10-CM | POA: Diagnosis not present

## 2024-03-06 NOTE — Patient Instructions (Signed)
 Medication Instructions:  Your physician recommends that you continue on your current medications as directed. Please refer to the Current Medication list given to you today.  *If you need a refill on your cardiac medications before your next appointment, please call your pharmacy*  Lab Work: None ordered.  If you have labs (blood work) drawn today and your tests are completely normal, you will receive your results only by: MyChart Message (if you have MyChart) OR A paper copy in the mail If you have any lab test that is abnormal or we need to change your treatment, we will call you to review the results.  Testing/Procedures: None ordered.   Follow-Up: At [facility], you and your health needs are our priority.  As part of our continuing mission to provide you with exceptional heart care, our providers are all part of one team.  This team includes your primary Cardiologist (physician) and Advanced Practice Providers or APPs (Physician Assistants and Nurse Practitioners) who all work together to provide you with the care you need, when you need it.  Your next appointment:   12 months with [facility]

## 2024-03-06 NOTE — Progress Notes (Signed)
  Electrophysiology Office Note:    Date:  03/06/2024   ID:  Amanda [name], DOB March 07, 1956, MRN [id number]  PCP:  Oris Camie BRAVO, NP   [facility] Providers Cardiologist:  Redell Shallow, MD Electrophysiologist:  Eulas BRAVO Furbish, MD     Referring MD: Oris Camie BRAVO, NP   History of Present Illness:    Amanda [name] is a 68 y.o. female with a medical history significant for AF, COPD, referred for management of AF.     I discussed the use of AI scribe software for clinical note transcription with the patient, who gave verbal consent to proceed.  The patient, with a history of paroxysmal atrial fibrillation, presents for a consultation regarding recurrent episodes of the condition.  She was diagnosed, to the best of her recollection, and about 2022.  The patient reports experiencing episodes of rapid heartbeat, which she associates with the atrial fibrillation. However, she also notes that she has been able to control her symptoms through distraction and other activities. The patient was unaware that she was in atrial fibrillation during the consultation, suggesting that she may not always be aware of when she is in an episode. The patient also has a history of COPD and is on multiple medications, including those for bipolar disorder.  She underwent ablation of persistent atrial fibrillation in February 2025.  She reports that she has been doing very well since the ablation not had any symptoms to suggest recurrence.  She has also quit smoking     Today, she feels reasonably well --she does not have any symptoms of atrial fibrillation currently and is surprised that her EKG shows any abnormality.  EKGs/Labs/Other Studies Reviewed Today:     Echocardiogram:  TTE June 2022 EF 60 to 65%.  Normal biatrial size.    Advanced imaging:  Coronary CT May 2022 Coronary calcium score is 0    EKG:   EKG Interpretation Date/Time:  Monday March 06 2024 11:30:28  EDT Ventricular Rate:  67 PR Interval:  168 QRS Duration:  78 QT Interval:  396 QTC Calculation: 418 R Axis:   20  Text Interpretation: Normal sinus rhythm Low voltage QRS When compared with ECG of 26-Oct-2023 15:11, No significant change was found Confirmed by Furbish Eulas [phone]) on 03/06/2024 11:37:31 AM     Physical Exam:    VS:  BP 124/78   Pulse 67   Ht 5' 3 (1.6 m)   Wt 171 lb (77.6 kg)   SpO2 95%   BMI 30.29 kg/m     Wt Readings from Last 3 Encounters:  03/06/24 171 lb (77.6 kg)  02/15/24 172 lb 3.2 oz (78.1 kg)  12/29/23 167 lb 12.8 oz (76.1 kg)     GEN: Well nourished, well developed in no acute distress CARDIAC: iRRR, no murmurs, rubs, gallops RESPIRATORY:  Normal work of breathing MUSCULOSKELETAL: no edema    ASSESSMENT & PLAN:     Persistent atrial fibrillation and flutter Often symptomatic with rapid ventricular rates Psychiatric medications limit antiarrhythmic drug use We discussed management options.  She would prefer to remain in sinus rhythm.  We discussed the ablation procedure and she would like to proceed. Will also plan for flutter ablation    Secondary hypercoagulable state CHA2DS2-VASc is 2 for age and female sex Off anticoagulation   Signed, Eulas BRAVO Furbish, MD  03/06/2024 11:41 AM    [facility]

## 2024-03-17 ENCOUNTER — Telehealth: Payer: Self-pay

## 2024-03-17 ENCOUNTER — Telehealth (HOSPITAL_BASED_OUTPATIENT_CLINIC_OR_DEPARTMENT_OTHER): Payer: Self-pay

## 2024-03-17 NOTE — Telephone Encounter (Signed)
 Called patient to set up a televisit appointment for a pre-op clearance on 04/11/24 @ 10:20. Meds, Rec, and Consent done.        Patient Consent for Virtual Visit        Amanda [name] has provided verbal consent on 03/17/2024 for a virtual visit (video or telephone).   CONSENT FOR VIRTUAL VISIT FOR:  Amanda [name]  By participating in this virtual visit I agree to the following:  I hereby voluntarily request, consent and authorize [facility] and its employed or contracted physicians, physician assistants, nurse practitioners or other licensed health care professionals (the Practitioner), to provide me with telemedicine health care services (the "Services) as deemed necessary by the treating Practitioner. I acknowledge and consent to receive the Services by the Practitioner via telemedicine. I understand that the telemedicine visit will involve communicating with the Practitioner through live audiovisual communication technology and the disclosure of certain medical information by electronic transmission. I acknowledge that I have been given the opportunity to request an in-person assessment or other available alternative prior to the telemedicine visit and am voluntarily participating in the telemedicine visit.  I understand that I have the right to withhold or withdraw my consent to the use of telemedicine in the course of my care at any time, without affecting my right to future care or treatment, and that the Practitioner or I may terminate the telemedicine visit at any time. I understand that I have the right to inspect all information obtained and/or recorded in the course of the telemedicine visit and may receive copies of available information for a reasonable fee.  I understand that some of the potential risks of receiving the Services via telemedicine include:  Delay or interruption in medical evaluation due to technological equipment failure or  disruption; Information transmitted may not be sufficient (e.g. poor resolution of images) to allow for appropriate medical decision making by the Practitioner; and/or  In rare instances, security protocols could fail, causing a breach of personal health information.  Furthermore, I acknowledge that it is my responsibility to provide information about my medical history, conditions and care that is complete and accurate to the best of my ability. I acknowledge that Practitioner's advice, recommendations, and/or decision may be based on factors not within their control, such as incomplete or inaccurate data provided by me or distortions of diagnostic images or specimens that may result from electronic transmissions. I understand that the practice of medicine is not an exact science and that Practitioner makes no warranties or guarantees regarding treatment outcomes. I acknowledge that a copy of this consent can be made available to me via my patient portal [facility] MyChart), or I can request a printed copy by calling the office of [facility].    I understand that my insurance will be billed for this visit.   I have read or had this consent read to me. I understand the contents of this consent, which adequately explains the benefits and risks of the Services being provided via telemedicine.  I have been provided ample opportunity to ask questions regarding this consent and the Services and have had my questions answered to my satisfaction. I give my informed consent for the services to be provided through the use of telemedicine in my medical care

## 2024-03-17 NOTE — Telephone Encounter (Signed)
 Called patient to set up a televisit appointment for a pre-op clearance on 04/11/24 @ 10:20. Meds, Rec, and Consent done.

## 2024-03-17 NOTE — Telephone Encounter (Signed)
   Name: Amanda [name]  DOB: 05/28/1956  MRN: [id number]  Primary Cardiologist: Redell Shallow, MD   Preoperative team, please contact this patient and set up a phone call appointment for further preoperative risk assessment. Please obtain consent and complete medication review. Thank you for your help.  I confirm that guidance regarding antiplatelet and oral anticoagulation therapy has been completed and, if necessary, noted below.  None requested   I also confirmed the patient resides in the state of [place] . As per [facility] Medical Board telemedicine laws, the patient must reside in the state in which the provider is licensed.   Josefa CHRISTELLA Beauvais, NP 03/17/2024, 1:06 PM [facility]

## 2024-03-17 NOTE — Telephone Encounter (Signed)
   Pre-operative Risk Assessment    Patient Name: Amanda [name]  DOB: Jul 27, 1955 MRN: [id number]   Date of last office visit: 03/06/24- Eulas Furbish MD Date of next office visit: None   Request for Surgical Clearance    Procedure:  Left Total Knee Arthroplasty  Date of Surgery:  Clearance TBD                                Surgeon:  Dr. Reyes Billing Surgeon's Group or Practice Name:  [facility] Phone number:  [phone] Fax number:  [phone]   Type of Clearance Requested:   - Medical    Type of Anesthesia:  Not Indicated   Additional requests/questions:    [name]   03/17/2024, 12:13 PM

## 2024-03-17 NOTE — Telephone Encounter (Signed)
 Received Fax from [facility] for Pre-Operative Clearance- Surgery Date TBD Form in [facility]

## 2024-03-26 ENCOUNTER — Encounter: Payer: Self-pay | Admitting: Nurse Practitioner

## 2024-03-27 NOTE — Telephone Encounter (Signed)
 Appt scheduled

## 2024-03-30 ENCOUNTER — Ambulatory Visit (INDEPENDENT_AMBULATORY_CARE_PROVIDER_SITE_OTHER): Admitting: Nurse Practitioner

## 2024-03-30 ENCOUNTER — Encounter: Payer: Self-pay | Admitting: Nurse Practitioner

## 2024-03-30 VITALS — BP 130/68 | HR 72 | Wt 170.8 lb

## 2024-03-30 DIAGNOSIS — G4486 Cervicogenic headache: Secondary | ICD-10-CM

## 2024-03-30 DIAGNOSIS — M150 Primary generalized (osteo)arthritis: Secondary | ICD-10-CM

## 2024-03-30 DIAGNOSIS — R140 Abdominal distension (gaseous): Secondary | ICD-10-CM | POA: Diagnosis not present

## 2024-03-30 DIAGNOSIS — R234 Changes in skin texture: Secondary | ICD-10-CM | POA: Diagnosis not present

## 2024-03-30 DIAGNOSIS — R338 Other retention of urine: Secondary | ICD-10-CM | POA: Insufficient documentation

## 2024-03-30 DIAGNOSIS — I480 Paroxysmal atrial fibrillation: Secondary | ICD-10-CM

## 2024-03-30 DIAGNOSIS — M7989 Other specified soft tissue disorders: Secondary | ICD-10-CM

## 2024-03-30 DIAGNOSIS — R601 Generalized edema: Secondary | ICD-10-CM

## 2024-03-30 HISTORY — DX: Other retention of urine: R33.8

## 2024-03-30 MED ORDER — MOMETASONE FUROATE 0.1 % EX CREA: TOPICAL_CREAM | CUTANEOUS | 1 refills | Status: AC

## 2024-03-30 MED ORDER — NURTEC 75 MG PO TBDP: ORAL_TABLET | ORAL | Status: DC

## 2024-03-30 MED ORDER — FUROSEMIDE 40 MG PO TABS: 40.0000 mg | ORAL_TABLET | Freq: Every day | ORAL | 1 refills | Status: AC | PRN

## 2024-03-30 NOTE — Assessment & Plan Note (Signed)
 Presents with cracked and peeling skin on fingers, which is painful. Could be related to a vitamin deficiency, such as low B12 or iron, or thyroid  dysfunction. 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 - Prescribe a cream for the fingers to alleviate symptoms - Order B12 and iron levels - Order thyroid  function tests

## 2024-03-30 NOTE — Assessment & Plan Note (Signed)
 Atrial fibrillation is well-managed, under regular follow-up with cardiologist. Recently released from frequent follow-ups and scheduled for annual visits. - Ensure cardiology clearance for knee surgery

## 2024-03-30 NOTE — Progress Notes (Signed)
 Camie FORBES Doing, DNP, AGNP-c [facility] [address], [place] [address] [phone]   ACUTE VISIT on 03/30/2024  Blood pressure 130/68, pulse 72, weight 170 lb 12.8 oz (77.5 kg), SpO2 94%.  Subjective:  HPI  History of Present Illness Amanda [name] is a 68 year old female who presents with bloating, weight fluctuation, and urinary issues.  Amanda [name] has been experiencing significant bloating and weight fluctuation over the past month, with her weight fluctuating from 173.7 pounds to 170 pounds within a day. The bloating is severe enough that Amanda [name] cannot tolerate wearing her bra. Amanda [name] is concerned about these changes, stating 'it just can't be all weight gain.'  Amanda [name] reports cracked and peeling skin on her fingers, which is painful. Amanda [name] describes her skin as feeling 'full' and believes the peeling may be due to fluid retention. Amanda [name] has had similar issues in the past and has been using furosemide , initially at 40 mg and then at 20 mg as needed, but feels it is not adequately addressing her symptoms.  Amanda [name] experiences frequent urination with an urge to urinate five times a night, but only dribbling when attempting to void. No burning sensation during urination and Amanda [name] does not believe Amanda [name] has an infection. Amanda [name] recalls that her kidney function tests were normal when checked previously.  Amanda [name] experiences fatigue, a 'raging headache,' and hot flashes. Amanda [name] uses Emgality  for headaches, which initially provided relief but now Amanda [name] experiences breakthrough headaches. Amanda [name] has also been using meloxicam, which helps with vertigo and headaches. Amanda [name] has a history of using hormone patches for hot flashes.  Amanda [name] has a history of knee issues and is awaiting knee replacement surgery. Amanda [name] is concerned about her urinary symptoms affecting her post-surgery recovery. Amanda [name] has a history of AFib and reports that her cardiologist has cleared her for surgery.  Amanda [name] has a history of using Pepcid for  reflux but stopped after noticing it caused indigestion. No shortness of breath, chest pain, dizziness, excessive gas, or changes in bowel movements. Amanda [name] quit smoking, which has improved her breathing.  Amanda [name] reports a family history of similar abdominal issues, noting her mother also had a large, hard abdomen. Amanda [name] describes her abdomen as 'hard as a rock' and has experienced this throughout her life.   ROS negative except for what is listed in HPI. History, Medications, Surgery, SDOH, and Family History reviewed and updated as appropriate.  Objective:  Physical Exam Vitals and nursing note reviewed.  Constitutional:      General: Amanda [name] is not in acute distress.    Appearance: Normal appearance. Amanda [name] is not ill-appearing.  HENT:     Head: Normocephalic.  Eyes:     General: No scleral icterus.    Pupils: Pupils are equal, round, and reactive to light.  Neck:     Vascular: No carotid bruit.  Cardiovascular:     Rate and Rhythm: Normal rate and regular rhythm.     Pulses: Normal pulses.     Heart sounds: Normal heart sounds.  Pulmonary:     Effort: Pulmonary effort is normal.     Breath sounds: Normal breath sounds.  Abdominal:     General: Bowel sounds are normal. There is distension.     Palpations: There is no mass.     Tenderness: There is no abdominal tenderness. There is no guarding or rebound.  Musculoskeletal:        General: Normal range of motion.     Cervical back: Tenderness present.  Skin:  General: Skin is warm and dry.     Capillary Refill: Capillary refill takes less than 2 seconds.     Comments: Peeling around fingertips on several fingers. No signs of infection.   Neurological:     General: No focal deficit present.     Mental Status: Amanda [name] is alert and oriented to person, place, and time.     Sensory: No sensory deficit.     Motor: No weakness.     Coordination: Coordination normal.  Psychiatric:        Mood and Affect: Mood normal.        Behavior: Behavior  normal.         Assessment & Plan:   Problem List Items Addressed This Visit     Osteoarthritis   Scheduled for knee replacement surgery and awaiting cardiology and PCP clearance. Concerned about urinary symptoms affecting post-operative recovery. - Ensure PCP clearance for knee surgery once labs have resulted      Atrial fibrillation (HCC)   Atrial fibrillation is well-managed, under regular follow-up with cardiologist. Recently released from frequent follow-ups and scheduled for annual visits. - Ensure cardiology clearance for knee surgery      Relevant Medications   furosemide  (LASIX ) 40 MG tablet   Generalized edema   Reports bloating, weight fluctuations, and urinary retention symptoms, including frequent urges to urinate but only dribbling. No burning sensation during urination. Previous kidney function tests were normal. Differential diagnosis includes thyroid  dysfunction, vitamin deficiencies, and blood sugar abnormalities. - Increase furosemide  to 40 mg daily - Order thyroid  function tests - Order blood sugar tests - Order B12 and iron levels - Order BNP      Relevant Medications   furosemide  (LASIX ) 40 MG tablet   Abdominal bloating - Primary   Distension noted in the abdomen with no masses or tenderness present. At this time it is unclear what is triggering the changes Amanda [name] is been experiencing. Will obtain labs and consider imaging if the results are not diagnostice.       Relevant Orders   Vitamin B12   CBC with Differential/Platelet   Comprehensive metabolic panel with GFR   TSH   T4, free   Iron, TIBC and Ferritin Panel   Hemoglobin A1c   POCT URINALYSIS DIP (CLINITEK)   Brain natriuretic peptide   Peeling skin   Presents with cracked and peeling skin on fingers, which is painful. Could be related to a vitamin deficiency, such as low B12 or iron, or thyroid  dysfunction. 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 - Prescribe a cream for the fingers to  alleviate symptoms - Order B12 and iron levels - Order thyroid  function tests      Relevant Medications   mometasone (ELOCON) 0.1 % cream   Other Relevant Orders   Vitamin B12   CBC with Differential/Platelet   Comprehensive metabolic panel with GFR   TSH   T4, free   Iron, TIBC and Ferritin Panel   Hemoglobin A1c   Acute urinary retention   Urgency with inability to urinate ongoing for about a month. No pain, abdominal tenderness, or signs of infection. Distension is present. Reports normal bowel habits. Will obtain labs for monitoring and consider imaging if the results are not diagnostic.       Relevant Orders   Vitamin B12   CBC with Differential/Platelet   Comprehensive metabolic panel with GFR   TSH   T4, free   Iron, TIBC and Ferritin Panel   Hemoglobin A1c  POCT URINALYSIS DIP (CLINITEK)   Cervicogenic headache   Reports neck pain and headaches potentially related to a pinched nerve. Stretching exercises exacerbate the pain, suggesting cervical radiculopathy. Experiences vertigo when lying down, which may be related. - Consider referral to physical therapy for neck exercises      Relevant Medications   Rimegepant Sulfate (NURTEC) 75 MG TBDP   Other Relevant Orders   Vitamin B12   CBC with Differential/Platelet   Comprehensive metabolic panel with GFR   TSH   T4, free   Iron, TIBC and Ferritin Panel   Hemoglobin A1c   Other Visit Diagnoses       Swelling of both hands       Relevant Orders   Vitamin B12   CBC with Differential/Platelet   Comprehensive metabolic panel with GFR   TSH   T4, free   Iron, TIBC and Ferritin Panel   Hemoglobin A1c   Brain natriuretic peptide        Camie FORBES Doing, DNP, AGNP-c Time: 45 minutes, >50% spent counseling, care coordination, chart review, and documentation.

## 2024-03-30 NOTE — Assessment & Plan Note (Signed)
 Urgency with inability to urinate ongoing for about a month. No pain, abdominal tenderness, or signs of infection. Distension is present. Reports normal bowel habits. Will obtain labs for monitoring and consider imaging if the results are not diagnostic.

## 2024-03-30 NOTE — Assessment & Plan Note (Signed)
 Reports bloating, weight fluctuations, and urinary retention symptoms, including frequent urges to urinate but only dribbling. No burning sensation during urination. Previous kidney function tests were normal. Differential diagnosis includes thyroid  dysfunction, vitamin deficiencies, and blood sugar abnormalities. - Increase furosemide  to 40 mg daily - Order thyroid  function tests - Order blood sugar tests - Order B12 and iron levels - Order BNP

## 2024-03-30 NOTE — Assessment & Plan Note (Signed)
 Distension noted in the abdomen with no masses or tenderness present. At this time it is unclear what is triggering the changes she is been experiencing. Will obtain labs and consider imaging if the results are not diagnostice.

## 2024-03-30 NOTE — Assessment & Plan Note (Addendum)
 Scheduled for knee replacement surgery and awaiting cardiology and PCP clearance. Concerned about urinary symptoms affecting post-operative recovery. - Ensure PCP clearance for knee surgery once labs have resulted

## 2024-03-30 NOTE — Assessment & Plan Note (Signed)
 Reports neck pain and headaches potentially related to a pinched nerve. Stretching exercises exacerbate the pain, suggesting cervical radiculopathy. Experiences vertigo when lying down, which may be related. - Consider referral to physical therapy for neck exercises

## 2024-03-31 LAB — COMPREHENSIVE METABOLIC PANEL WITH GFR
ALT: 9 IU/L (ref 0–32)
AST: 15 IU/L (ref 0–40)
Albumin: 4.4 g/dL (ref 3.9–4.9)
Alkaline Phosphatase: 90 IU/L (ref 49–135)
BUN/Creatinine Ratio: 23 (ref 12–28)
BUN: 22 mg/dL (ref 8–27)
Bilirubin Total: <0.2 mg/dL (ref 0.0–1.2)
CO2: 27 mmol/L (ref 20–29)
Calcium: 9.1 mg/dL (ref 8.7–10.3)
Chloride: 97 mmol/L (ref 96–106)
Creatinine, Ser: 0.97 mg/dL (ref 0.57–1.00)
Globulin, Total: 2.6 g/dL (ref 1.5–4.5)
Glucose: 105 mg/dL — ABNORMAL HIGH (ref 70–99)
Potassium: 4.6 mmol/L (ref 3.5–5.2)
Sodium: 138 mmol/L (ref 134–144)
Total Protein: 7 g/dL (ref 6.0–8.5)
eGFR: 65 mL/min/1.73 (ref >59)

## 2024-03-31 LAB — CBC WITH DIFFERENTIAL/PLATELET
Basophils Absolute: 0.1 x10E3/uL (ref 0.0–0.2)
Basos: 1 %
EOS (ABSOLUTE): 0 x10E3/uL (ref 0.0–0.4)
Eos: 0 %
Hematocrit: 38.6 % (ref 34.0–46.6)
Hemoglobin: 12.3 g/dL (ref 11.1–15.9)
Immature Grans (Abs): 0.2 x10E3/uL — ABNORMAL HIGH (ref 0.0–0.1)
Immature Granulocytes: 2 %
Lymphocytes Absolute: 1.5 x10E3/uL (ref 0.7–3.1)
Lymphs: 10 %
MCH: 27.3 pg (ref 26.6–33.0)
MCHC: 31.9 g/dL (ref 31.5–35.7)
MCV: 86 fL (ref 79–97)
Monocytes Absolute: 0.7 x10E3/uL (ref 0.1–0.9)
Monocytes: 5 %
Neutrophils Absolute: 12.1 x10E3/uL — ABNORMAL HIGH (ref 1.4–7.0)
Neutrophils: 82 %
Platelets: 313 x10E3/uL (ref 150–450)
RBC: 4.51 x10E6/uL (ref 3.77–5.28)
RDW: 12.8 % (ref 11.7–15.4)
WBC: 14.6 x10E3/uL — ABNORMAL HIGH (ref 3.4–10.8)

## 2024-03-31 LAB — T4, FREE: Free T4: 1.06 ng/dL (ref 0.82–1.77)

## 2024-03-31 LAB — IRON,TIBC AND FERRITIN PANEL
Ferritin: 23 ng/mL (ref 15–150)
Iron Saturation: 13 % — ABNORMAL LOW (ref 15–55)
Iron: 44 ug/dL (ref 27–139)
Total Iron Binding Capacity: 343 ug/dL (ref 250–450)
UIBC: 299 ug/dL (ref 118–369)

## 2024-03-31 LAB — BRAIN NATRIURETIC PEPTIDE: BNP: 31.1 pg/mL (ref 0.0–100.0)

## 2024-03-31 LAB — HEMOGLOBIN A1C
Est. average glucose Bld gHb Est-mCnc: 117 mg/dL
Hgb A1c MFr Bld: 5.7 % — ABNORMAL HIGH (ref 4.8–5.6)

## 2024-03-31 LAB — VITAMIN B12: Vitamin B-12: 400 pg/mL (ref 232–1245)

## 2024-03-31 LAB — TSH: TSH: 1.34 u[IU]/mL (ref 0.450–4.500)

## 2024-04-06 ENCOUNTER — Ambulatory Visit: Payer: Self-pay | Admitting: Nurse Practitioner

## 2024-04-06 DIAGNOSIS — K219 Gastro-esophageal reflux disease without esophagitis: Secondary | ICD-10-CM

## 2024-04-06 DIAGNOSIS — K760 Fatty (change of) liver, not elsewhere classified: Secondary | ICD-10-CM

## 2024-04-06 DIAGNOSIS — K8689 Other specified diseases of pancreas: Secondary | ICD-10-CM

## 2024-04-06 DIAGNOSIS — R1085 Abdominal pain of multiple sites: Secondary | ICD-10-CM

## 2024-04-06 DIAGNOSIS — R140 Abdominal distension (gaseous): Secondary | ICD-10-CM

## 2024-04-06 DIAGNOSIS — Z87891 Personal history of nicotine dependence: Secondary | ICD-10-CM

## 2024-04-11 ENCOUNTER — Encounter: Payer: Self-pay | Admitting: Nurse Practitioner

## 2024-04-11 ENCOUNTER — Ambulatory Visit: Attending: Internal Medicine | Admitting: Nurse Practitioner

## 2024-04-11 DIAGNOSIS — Z01810 Encounter for preprocedural cardiovascular examination: Secondary | ICD-10-CM | POA: Diagnosis not present

## 2024-04-11 NOTE — Progress Notes (Signed)
 Virtual Visit via Telephone Note   Because of Amanda Amanda [name] co-morbid illnesses, she is at least at moderate risk for complications without adequate follow up.  This format is felt to be most appropriate for this patient at this time.  Due to technical limitations with video connection (technology), Amanda [name]'s appointment will be conducted as an audio only telehealth visit, and Amanda Amanda [name].   All issues noted in this document were discussed and addressed.  No physical exam could be performed with this format.  Evaluation Performed:  Preoperative cardiovascular risk assessment _____________   Date:  04/11/2024   Patient ID:  Amanda Amanda [name], DOB Jun 27, 1955, MRN [id number] Patient Location:  Home Provider location:   Office  Primary Care Provider:  Oris Camie BRAVO, NP Primary Cardiologist:  Redell Shallow, MD  Chief Complaint / Patient Profile   68 y.o. y/o female with a h/o persistent atrial fibrillation and flutter not on anticoagulation, s/p a fib ablation 07/26/23, coronary artery calcification, HLD, who is pending left total knee arthroplasty and presents Amanda [name] for telephonic preoperative cardiovascular risk assessment.  History of Present Illness    Amanda Amanda [name] is a 68 y.o. female who presents via audio/video conferencing for a telehealth visit Amanda [name].  Pt was last seen in cardiology clinic on 03/06/24 by Dr. Nancey.  At that time Amanda Amanda [name] was doing well.  The patient is now pending procedure as outlined above. Since her last visit, she denies chest pain, shortness of breath, lower extremity edema, fatigue, palpitations, melena, hematuria, hemoptysis, diaphoresis, weakness, presyncope, syncope, orthopnea, and PND. She has history of vertigo and experiences dizziness particularly with getting up fast. She has chronic back pain and spinal stenosis that somewhat limits activity but she is able to achieve > 4 METS activity  without concerning cardiac symptoms. She states she is feeling great since her ablation and no return of a fib symptoms.    Past Medical History    Past Medical History:  Diagnosis Date   Arthritis    R foot and L knee   Atrial fibrillation (HCC)    DEGENERATIVE JOINT DISEASE 11/12/2006   DISORDER, BIPOLAR NOS 02/28/2007   GERD 11/12/2006   Major depressive disorder, single episode, unspecified 07/15/2021   Past Surgical History:  Procedure Laterality Date   ABDOMINAL HYSTERECTOMY     ATRIAL FIBRILLATION ABLATION N/A 07/26/2023   Procedure: ATRIAL FIBRILLATION ABLATION;  Surgeon: Nancey Eulas BRAVO, MD;  Location: [facility];  Service: Cardiovascular;  Laterality: N/A;   BREAST SURGERY     reduction   KNEE ARTHROSCOPY     TONSILLECTOMY      Allergies  Allergies  Allergen Reactions   Augmentin [Amoxicillin-Pot Clavulanate] Other (See Comments)    Has patient had a PCN reaction causing immediate rash, facial/tongue/throat swelling, SOB or lightheadedness with hypotension: No Has patient had a PCN reaction causing severe rash involving mucus membranes or skin necrosis: No Has patient had a PCN reaction that required hospitalization: No Has patient had a PCN reaction occurring within the last 10 years: No If all of the above answers are NO, then may proceed with Cephalosporin use.    Sumatriptan Other (See Comments)   Doxycycline Hyclate Hives and Rash    Home Medications    Prior to Admission medications   Medication Sig Start Date End Date Taking? Authorizing Provider  albuterol (VENTOLIN HFA) 108 (90 Base) MCG/ACT inhaler Inhale 2 puffs into the lungs  every 6 (six) hours as needed for shortness of breath or wheezing. 05/08/21   [provider]  baclofen (LIORESAL) 10 MG tablet Take 10 mg by mouth in the morning. 08/20/21   [provider]  Buprenorphine  HCl-Naloxone  HCl 1.4-0.36 MG SUBL Place 1-2 tablets under the tongue See admin instructions.  Take 2 tablets by mouth in the morning and take 1 tablet in afternoon    [provider]  citalopram  (CELEXA ) 20 MG tablet Take 1 tablet (20 mg total) by mouth at bedtime. 09/14/23   Rhys Boyer T, PA-C  ezetimibe (ZETIA) 10 MG tablet Take 10 mg by mouth at bedtime.    [provider]  furosemide  (LASIX ) 40 MG tablet Take 1 tablet (40 mg total) by mouth daily as needed for edema. 03/30/24   Early, Sara E, NP  gabapentin  (NEURONTIN ) 100 MG capsule Take 1 capsule (100 mg total) by mouth at bedtime. 09/14/23   Rhys Boyer T, PA-C  Galcanezumab -gnlm (EMGALITY ) 120 MG/ML SOAJ Inject 120 mg into the skin every 30 (thirty) days. 02/15/24   Ines Onetha NOVAK, MD  Galcanezumab -gnlm (EMGALITY ) 120 MG/ML SOAJ Inject 120 mg into the skin every 30 (thirty) days. 02/15/24   Ines Onetha NOVAK, MD  lamoTRIgine  (LAMICTAL ) 100 MG tablet Take 1 tablet (100 mg total) by mouth daily. 09/14/23   Rhys Boyer DASEN, PA-C  meloxicam (MOBIC) 7.5 MG tablet Take 7.5 mg by mouth daily. Take an extra one as needed 10/01/23   [provider]  mometasone (ELOCON) 0.1 % cream Apply to the finger tips twice a day until peeling has stopped. 03/30/24   Early, Sara E, NP  ondansetron  (ZOFRAN -ODT) 4 MG disintegrating tablet Take 4 mg by mouth every 8 (eight) hours as needed for vomiting or nausea.    [provider]  Rimegepant Sulfate (NURTEC) 75 MG TBDP Take 1 tab at the first sign of migrain 03/30/24   Early, Sara E, NP  risperiDONE  (RISPERDAL ) 1 MG tablet Take 1 tablet (1 mg total) by mouth at bedtime. 09/14/23   Rhys Boyer T, PA-C  Vitamin D , Ergocalciferol , (DRISDOL ) 1.25 MG (50000 UNIT) CAPS capsule Take 1 capsule by mouth once a week 01/03/24   Early, Camie BRAVO, NP    Physical Exam    Vital Signs:  Amanda Amanda [name] does not have vital signs available for review Amanda [name].  Given telephonic nature of communication, physical exam is limited. AAOx3. NAD. Normal affect.  Speech and respirations are  unlabored.  Accessory Clinical Findings    None  Assessment & Plan    1.  Preoperative Cardiovascular Risk Assessment: According to the Revised Cardiac Risk Index (RCRI), her Perioperative Risk of Major Cardiac Event is (%): 0.9. Her Functional Capacity in METs is: 7.19 according to the Duke Activity Status Index (DASI). The patient is doing well from a cardiac perspective. Therefore, based on ACC/AHA guidelines, the patient would be at acceptable risk for the planned procedure without further cardiovascular testing.    The patient was advised that if she develops new symptoms prior to surgery to contact our office to arrange for a follow-up visit, and she verbalized understanding.  No request to hold cardiac medications.  A copy of this note will be routed to requesting surgeon.  Time:   Amanda [name], I have spent 10 minutes with the patient with telehealth technology discussing medical history, symptoms, and management plan.     Amanda EMERSON Bane, Amanda Amanda [name]  04/11/2024, 10:21 AM [address], [address],  [place] [address] Office [phone] Fax [phone]

## 2024-04-12 DIAGNOSIS — R1085 Abdominal pain of multiple sites: Secondary | ICD-10-CM | POA: Insufficient documentation

## 2024-04-12 MED ORDER — PANTOPRAZOLE SODIUM 40 MG PO TBEC: 40.0000 mg | DELAYED_RELEASE_TABLET | Freq: Every day | ORAL | 3 refills | Status: DC

## 2024-04-13 ENCOUNTER — Encounter: Payer: Self-pay | Admitting: Nurse Practitioner

## 2024-04-13 ENCOUNTER — Ambulatory Visit (INDEPENDENT_AMBULATORY_CARE_PROVIDER_SITE_OTHER): Payer: Self-pay | Admitting: Nurse Practitioner

## 2024-04-13 ENCOUNTER — Telehealth: Payer: Self-pay | Admitting: Neurology

## 2024-04-13 VITALS — BP 132/84 | HR 70 | Ht 61.25 in | Wt 173.0 lb

## 2024-04-13 DIAGNOSIS — E782 Mixed hyperlipidemia: Secondary | ICD-10-CM | POA: Diagnosis not present

## 2024-04-13 DIAGNOSIS — Z87891 Personal history of nicotine dependence: Secondary | ICD-10-CM

## 2024-04-13 DIAGNOSIS — L089 Local infection of the skin and subcutaneous tissue, unspecified: Secondary | ICD-10-CM

## 2024-04-13 DIAGNOSIS — Z0000 Encounter for general adult medical examination without abnormal findings: Secondary | ICD-10-CM | POA: Diagnosis not present

## 2024-04-13 DIAGNOSIS — J439 Emphysema, unspecified: Secondary | ICD-10-CM | POA: Diagnosis not present

## 2024-04-13 DIAGNOSIS — G43711 Chronic migraine without aura, intractable, with status migrainosus: Secondary | ICD-10-CM

## 2024-04-13 DIAGNOSIS — I480 Paroxysmal atrial fibrillation: Secondary | ICD-10-CM

## 2024-04-13 DIAGNOSIS — Z01818 Encounter for other preprocedural examination: Secondary | ICD-10-CM | POA: Insufficient documentation

## 2024-04-13 MED ORDER — CEPHALEXIN 500 MG PO CAPS: 500.0000 mg | ORAL_CAPSULE | Freq: Three times a day (TID) | ORAL | 0 refills | Status: DC

## 2024-04-13 NOTE — Assessment & Plan Note (Signed)
 Physical exam completed today with no concerning findings at this time.  Patient does have a history of COPD however this does appear to be well-controlled with no alarm symptoms present.  I do strongly recommend Peretz Thieme pulmonary toileting to reduce risks after procedure.  History of atrial fibrillation with recent clearance from cardiology to follow only once annually.  She also has been taken off of her anticoagulant therapy.  Heart rate and rhythm were regular today with no concerning symptoms.  I do recommend cardiology clearance given the increased risks of blood clots following surgery and in the setting of atrial fibrillation.  Recommend close cardiac monitoring during and after procedure to ensure that atrial fibrillation is not developed.

## 2024-04-13 NOTE — Assessment & Plan Note (Signed)
 Lung cancer screening CT ordered today.  I will contact patient to set this up at her convenience.  No alarm symptoms present at this time.

## 2024-04-13 NOTE — Assessment & Plan Note (Signed)
 Chronic COPD with improved respiratory function following smoking cessation.  She has not been using her inhalers which is reassuring.  She does still have a cough but this is not severe in nature.  Given her history of smoking I do recommend daily lung cancer screening with low-dose CT.  I will place the order for this today.  Her lungs are clear bilaterally today.  At this time I do not see any significant concerns that would prohibit her from having upcoming knee surgery.  I do recommend close monitoring of respiratory rate and Yoltzin Ransom pulmonary toileting.

## 2024-04-13 NOTE — Telephone Encounter (Signed)
 Called pt at [phone]. Pt forgot how to do injection and completed incorrectly (did not read directions). Forgot to take lid of lid off before pushing button first before taking lid off. When she went to take lid off, medication came out. Did not receive dose. Relayed that I checked and we do not have any samples of Emgality  to provide.   Recommended she call [employer]/manufacturer to see if they can provide a replacement dose. She asked that I send mychart with phone#. I sent mychart as recommended. She will call back if unsuccessful in getting replacement after speaking with them.

## 2024-04-13 NOTE — Assessment & Plan Note (Signed)
 Labs pending

## 2024-04-13 NOTE — Telephone Encounter (Signed)
 Pt had an accident with her Galcanezumab -gnlm (EMGALITY ) 120 MG/ML SOAJ , she is asking if there is a sample available as a result of her accident with the medication.

## 2024-04-13 NOTE — Assessment & Plan Note (Signed)
 Erythema, swelling, and warmth noted on several fingertips with fissuring present at the fingertip near the edge of the nailbed.  No drainage is present.  It appears that she has developed some type of skin infection around the nails.  I would like to have this cleared up prior to her upcoming surgery.  She is allergic to doxycycline and amoxicillin.  We discussed the option of Keflex and she feels that she has taken this successfully in the past.  She will try this medication but let me know immediately if she has any unusual side effects.

## 2024-04-13 NOTE — Patient Instructions (Signed)
 I have ordered a CT of the chest for lung cancer screening. The imaging center will call you to schedule this.  I will complete your paperwork for the surgery and send this over to [facility].  Let me know if you have any side effects of the Keflex or if your fingers do not improve.   There are no preventive care reminders to display for this patient.   For all adult patients, I recommend A well balanced diet low in saturated fats, cholesterol, and moderation in carbohydrates.   This can be as simple as monitoring portion sizes and cutting back on sugary beverages such as soda and juice to start with.    Daily water consumption of at least 64 ounces.  Physical activity at least 180 minutes per week, if just starting out.   This can be as simple as taking the stairs instead of the elevator and walking 2-3 laps around the office  purposefully every day.   STD protection, partner selection, and regular testing if high risk.  Limited consumption of alcoholic beverages if alcohol is consumed.  For women, I recommend no more than 7 alcoholic beverages per week, spread out throughout the week.  Avoid binge drinking or consuming large quantities of alcohol in one setting.   Please let me know if you feel you may need help with reduction or quitting alcohol consumption.   Avoidance of nicotine , if used.  Please let me know if you feel you may need help with reduction or quitting nicotine  use.   Daily mental health attention.  This can be in the form of 5 minute daily meditation, prayer, journaling, yoga, reflection, etc.   Purposeful attention to your emotions and mental state can significantly improve your overall wellbeing  and  Health.  Please know that I am here to help you with all of your health care goals and am happy to work with you to find a solution that works best for you.  The greatest advice I have received with any changes in life are to take it one step at a time, that even means  if all you can focus on is the next 60 seconds, then do that and celebrate your victories.  With any changes in life, you will have set backs, and that is OK. The important thing to remember is, if you have a set back, it is not a failure, it is an opportunity to try again!  Health Maintenance Recommendations Screening Testing Mammogram Every 1 -2 years based on history and risk factors Starting at age 52 Pap Smear Ages 21-39 every 3 years Ages 33-65 every 5 years with HPV testing More frequent testing may be required based on results and history Colon Cancer Screening Every 1-10 years based on test performed, risk factors, and history Starting at age 45 Bone Density Screening Every 2-10 years based on history Starting at age 33 for women Recommendations for men differ based on medication usage, history, and risk factors AAA Screening One time ultrasound Men 31-76 years old who have every smoked Lung Cancer Screening Low Dose Lung CT every 12 months Age 66-80 years with a 30 pack-year smoking history who still smoke or who have quit within the last 15 years  Screening Labs Routine  Labs: Complete Blood Count (CBC), Complete Metabolic Panel (CMP), Cholesterol (Lipid Panel) Every 6-12 months based on history and medications May be recommended more frequently based on current conditions or previous results Hemoglobin A1c Lab Every 3-12 months based  on history and previous results Starting at age 56 or earlier with diagnosis of diabetes, high cholesterol, BMI >26, and/or risk factors Frequent monitoring for patients with diabetes to ensure blood sugar control Thyroid  Panel (TSH w/ T3 & T4) Every 6 months based on history, symptoms, and risk factors May be repeated more often if on medication HIV One time testing for all patients 65 and older May be repeated more frequently for patients with increased risk factors or exposure Hepatitis C One time testing for all patients 24 and  older May be repeated more frequently for patients with increased risk factors or exposure Gonorrhea, Chlamydia Every 12 months for all sexually active persons 13-24 years Additional monitoring may be recommended for those who are considered high risk or who have symptoms PSA Men 80-22 years old with risk factors Additional screening may be recommended from age 102-69 based on risk factors, symptoms, and history  Vaccine Recommendations Tetanus Booster All adults every 10 years Flu Vaccine All patients 6 months and older every year COVID Vaccine All patients 12 years and older Initial dosing with booster May recommend additional booster based on age and health history HPV Vaccine 2 doses all patients age 30-26 Dosing may be considered for patients over 26 Shingles Vaccine (Shingrix) 2 doses all adults 55 years and older Pneumonia (Pneumovax 23) All adults 65 years and older May recommend earlier dosing based on health history Pneumonia (Prevnar 44) All adults 65 years and older Dosed 1 year after Pneumovax 23  Additional Screening, Testing, and Vaccinations may be recommended on an individualized basis based on family history, health history, risk factors, and/or exposure.

## 2024-04-13 NOTE — Telephone Encounter (Signed)
 I LMVM message to [employer] about emgality  replacement auto syringe.

## 2024-04-13 NOTE — Assessment & Plan Note (Signed)
 Atrial fibrillation well-controlled at this time.  Currently on annual follow-up with cardiology.  No longer on blood thinners.  Recommend reach out to cardiology for release for upcoming knee surgery.  No alarm symptoms present at this time.

## 2024-04-13 NOTE — Progress Notes (Signed)
 [name], DNP, AGNP-c [facility] [address] [place], [place] [address] Main Office [phone] VISIT TYPE: CPE on 04/13/2024 Today's Vitals   04/13/24 1423 04/13/24 1438  BP: (!) 144/92 132/84  Pulse: 70   SpO2: 94%   Weight: 173 lb (78.5 kg)   Height: 5' 1.25 (1.556 m)    Body mass index is 32.42 kg/m. BP 132/84   Pulse 70   Ht 5' 1.25 (1.556 m)   Wt 173 lb (78.5 kg)   SpO2 94%   BMI 32.42 kg/m   Subjective:    Patient ID: Amanda [name], female    DOB: July 06, 1955, 68 y.o.   MRN: [id number]  HPI: History of Present Illness Amanda [name] is a 68 year old female who presents for physical examination for upcoming surgical clearance. She also has concerns with severe finger pain and swelling.  She experiences severe, constant, and debilitating pain in her fingers at the nail edge, which affects her ability to perform daily tasks such as buttoning jeans and washing dishes. The pain began in one finger and has spread to others, with one finger becoming painful just this morning. She has tried using a betamethasone cream for relief. This helped with the peeling skin, but the pain is still present.   She also reports significant pain in her toenails, particularly in the big toes on both feet, which is exacerbated by touch. One toenail is potentially coming off, and the pain is present even without shoe contact. She keeps her toenails painted, making it difficult to assess their condition.  She has a history of her fingernails tearing easily, with one nail having torn at the base recently, and she is concerned this might be related to the pain in her fingers.  She has a history of arthritis, for which she takes meloxicam, finding it effective for her headaches as well. She previously used Motrin and Excedrin for pain management.    She describes episodes of severe chest pain attributed to indigestion, which she refers to as a 'heart attack',  finding relief by drinking soda, which she believes helps release gas.  She has a history of iron deficiency and slightly elevated A1c levels, but no significant anemia.  She has stopped smoking and does not plan to resume.   She mentions a history of taking Ajovy  for migraines, which she receives through her insurance at no cost. She had an issue with administering the medication recently but was able to resolve it.  Pertinent items are noted in HPI.  Mammogram completed with GYN   Most Recent Depression Screen:     04/13/2024    2:23 PM 09/07/2023   11:37 AM 09/01/2022   11:33 AM 04/24/2022   10:49 AM 07/17/2021    7:35 AM  Depression screen PHQ 2/9  Decreased Interest 0 0 0 0 0  Down, Depressed, Hopeless 0 3 0 0 0  PHQ - 2 Score 0 3 0 0 0  Altered sleeping 0 0 0 0 0  Tired, decreased energy 0 0 0 0 0  Change in appetite 0 0 0 0 0  Feeling bad or failure about yourself  0 0 0 0 0  Trouble concentrating 0 0 0 0 0  Moving slowly or fidgety/restless 0 0 0 0 0  Suicidal thoughts 0 0 0 0 0  PHQ-9 Score 0 3  0  0  0   Difficult doing work/chores Not difficult at all Not difficult at all Not difficult  at all Not difficult at all Not difficult at all     Data saved with a previous flowsheet row definition   Most Recent Anxiety Screen:     04/24/2022   10:50 AM 07/17/2021    7:35 AM  GAD 7 : Generalized Anxiety Score  Nervous, Anxious, on Edge 0 0  Control/stop worrying 0 0  Worry too much - different things 0 0  Trouble relaxing 0 0  Restless 0 0  Easily annoyed or irritable 0 0  Afraid - awful might happen 0 0  Total GAD 7 Score 0 0  Anxiety Difficulty Not difficult at all Not difficult at all   Most Recent Fall Screen:    04/13/2024    2:23 PM 09/06/2023    2:21 PM 09/01/2022   11:33 AM 04/24/2022   10:50 AM 07/17/2021    7:35 AM  Fall Risk   Falls in the past year? 0 0 0 0 0  Number falls in past yr: 0 0 0  0  Injury with Fall? 0 0 0  0  Risk for fall due to : No Fall  Risks Medication side effect Medication side effect  No Fall Risks  Follow up Falls evaluation completed Falls prevention discussed;Falls evaluation completed Falls prevention discussed;Education provided;Falls evaluation completed  Falls evaluation completed      Data saved with a previous flowsheet row definition    Past medical history, surgical history, medications, allergies, family history and social history reviewed with patient today and changes made to appropriate areas of the chart.  Past Medical History:  Past Medical History:  Diagnosis Date   Acute urinary retention 03/30/2024   Arthritis    R foot and L knee   Atrial fibrillation (HCC)    DEGENERATIVE JOINT DISEASE 11/12/2006   DISORDER, BIPOLAR NOS 02/28/2007   Easy bruising 01/17/2023   GERD 11/12/2006   Major depressive disorder, single episode, unspecified 07/15/2021   Medications:  Current Outpatient Medications on File Prior to Visit  Medication Sig   albuterol (VENTOLIN HFA) 108 (90 Base) MCG/ACT inhaler Inhale 2 puffs into the lungs every 6 (six) hours as needed for shortness of breath or wheezing.   baclofen (LIORESAL) 10 MG tablet Take 10 mg by mouth in the morning.   Buprenorphine  HCl-Naloxone  HCl 1.4-0.36 MG SUBL Place 1-2 tablets under the tongue See admin instructions. Take 2 tablets by mouth in the morning and take 1 tablet in afternoon   citalopram  (CELEXA ) 20 MG tablet Take 1 tablet (20 mg total) by mouth at bedtime.   ezetimibe (ZETIA) 10 MG tablet Take 10 mg by mouth at bedtime.   furosemide  (LASIX ) 40 MG tablet Take 1 tablet (40 mg total) by mouth daily as needed for edema.   gabapentin  (NEURONTIN ) 100 MG capsule Take 1 capsule (100 mg total) by mouth at bedtime.   Galcanezumab -gnlm (EMGALITY ) 120 MG/ML SOAJ Inject 120 mg into the skin every 30 (thirty) days.   Galcanezumab -gnlm (EMGALITY ) 120 MG/ML SOAJ Inject 120 mg into the skin every 30 (thirty) days.   lamoTRIgine  (LAMICTAL ) 100 MG tablet Take 1  tablet (100 mg total) by mouth daily.   meloxicam (MOBIC) 7.5 MG tablet Take 7.5 mg by mouth daily. Take an extra one as needed   mometasone (ELOCON) 0.1 % cream Apply to the finger tips twice a day until peeling has stopped.   ondansetron  (ZOFRAN -ODT) 4 MG disintegrating tablet Take 4 mg by mouth every 8 (eight) hours as needed for vomiting or  nausea.   risperiDONE  (RISPERDAL ) 1 MG tablet Take 1 tablet (1 mg total) by mouth at bedtime.   Vitamin D , Ergocalciferol , (DRISDOL ) 1.25 MG (50000 UNIT) CAPS capsule Take 1 capsule by mouth once a week   pantoprazole (PROTONIX) 40 MG tablet Take 1 tablet (40 mg total) by mouth daily. For reflux and bloating (Patient not taking: Reported on 04/13/2024)   No current facility-administered medications on file prior to visit.   Surgical History:  Past Surgical History:  Procedure Laterality Date   ABDOMINAL HYSTERECTOMY     ATRIAL FIBRILLATION ABLATION N/A 07/26/2023   Procedure: ATRIAL FIBRILLATION ABLATION;  Surgeon: Nancey Eulas BRAVO, MD;  Location: [facility];  Service: Cardiovascular;  Laterality: N/A;   BREAST SURGERY     reduction   KNEE ARTHROSCOPY     TONSILLECTOMY     Allergies:  Allergies  Allergen Reactions   Augmentin [Amoxicillin-Pot Clavulanate] Other (See Comments)    Has patient had a PCN reaction causing immediate rash, facial/tongue/throat swelling, SOB or lightheadedness with hypotension: No Has patient had a PCN reaction causing severe rash involving mucus membranes or skin necrosis: No Has patient had a PCN reaction that required hospitalization: No Has patient had a PCN reaction occurring within the last 10 years: No If all of the above answers are NO, then may proceed with Cephalosporin use.    Sumatriptan Other (See Comments)   Doxycycline Hyclate Hives and Rash   Family History:  Family History  Problem Relation Age of Onset   Dementia Mother    Congestive Heart Failure Father    Migraines Father     Dementia Maternal Aunt    Dementia Maternal Aunt    Dementia Maternal Grandmother        Objective:    BP 132/84   Pulse 70   Ht 5' 1.25 (1.556 m)   Wt 173 lb (78.5 kg)   SpO2 94%   BMI 32.42 kg/m   Wt Readings from Last 3 Encounters:  04/13/24 173 lb (78.5 kg)  03/30/24 170 lb 12.8 oz (77.5 kg)  03/06/24 171 lb (77.6 kg)    Physical Exam Vitals and nursing note reviewed.  Constitutional:      General: She is not in acute distress.    Appearance: Normal appearance. She is normal weight. She is not ill-appearing.  HENT:     Head: Normocephalic.     Right Ear: Tympanic membrane normal.     Left Ear: Tympanic membrane normal.     Nose: Nose normal.     Mouth/Throat:     Mouth: Mucous membranes are moist.     Pharynx: Oropharynx is clear.  Eyes:     General: No scleral icterus.    Pupils: Pupils are equal, round, and reactive to light.  Neck:     Vascular: No carotid bruit.  Cardiovascular:     Rate and Rhythm: Normal rate and regular rhythm.     Pulses: Normal pulses.     Heart sounds: Normal heart sounds.  Pulmonary:     Effort: Pulmonary effort is normal.     Breath sounds: Normal breath sounds. No wheezing or rhonchi.  Abdominal:     General: Bowel sounds are normal. There is no distension.     Palpations: Abdomen is soft.     Tenderness: There is no abdominal tenderness. There is no right CVA tenderness, left CVA tenderness or guarding.  Musculoskeletal:     Cervical back: No tenderness.  Right lower leg: No edema.     Left lower leg: No edema.  Lymphadenopathy:     Cervical: No cervical adenopathy.  Skin:    General: Skin is warm and dry.     Capillary Refill: Capillary refill takes less than 2 seconds.  Neurological:     Mental Status: She is alert and oriented to person, place, and time.     Sensory: No sensory deficit.     Motor: No weakness.     Coordination: Coordination normal.  Psychiatric:        Mood and Affect: Mood normal.         Behavior: Behavior normal.      Results for orders placed or performed in visit on 04/13/24  [facility] PAP SMEAR   Collection Time: 08/02/17 12:00 AM  Result Value Ref Range   HM Pap smear normal   [facility] MAMMOGRAPHY   Collection Time: 10/07/23 12:00 AM  Result Value Ref Range   HM Mammogram 0-4 Bi-Rad 0-4 Bi-Rad, Self Reported Normal  HM DEXA SCAN   Collection Time: 10/07/23 12:00 AM  Result Value Ref Range   HM Dexa Scan completed        Assessment & Plan:   Problem List Items Addressed This Visit     Atrial fibrillation (HCC)   Atrial fibrillation well-controlled at this time.  Currently on annual follow-up with cardiology.  No longer on blood thinners.  Recommend reach out to cardiology for release for upcoming knee surgery.  No alarm symptoms present at this time.      COPD (chronic obstructive pulmonary disease) (HCC)   Chronic COPD with improved respiratory function following smoking cessation.  She has not been using her inhalers which is reassuring.  She does still have a cough but this is not severe in nature.  Given her history of smoking I do recommend daily lung cancer screening with low-dose CT.  I will place the order for this today.  Her lungs are clear bilaterally today.  At this time I do not see any significant concerns that would prohibit her from having upcoming knee surgery.  I do recommend close monitoring of respiratory rate and Lorin Gawron pulmonary toileting.      Relevant Orders   Ambulatory Referral for Lung Cancer Screening [REF832]   Hyperlipidemia, unspecified   Labs pending      History of smoking   Lung cancer screening CT ordered today.  I will contact patient to set this up at her convenience.  No alarm symptoms present at this time.      Relevant Orders   Ambulatory Referral for Lung Cancer Screening [REF832]   Encounter for preoperative examination for general surgical procedure - Primary   Physical exam completed today with no concerning findings at  this time.  Patient does have a history of COPD however this does appear to be well-controlled with no alarm symptoms present.  I do strongly recommend Marabella Popiel pulmonary toileting to reduce risks after procedure.  History of atrial fibrillation with recent clearance from cardiology to follow only once annually.  She also has been taken off of her anticoagulant therapy.  Heart rate and rhythm were regular today with no concerning symptoms.  I do recommend cardiology clearance given the increased risks of blood clots following surgery and in the setting of atrial fibrillation.  Recommend close cardiac monitoring during and after procedure to ensure that atrial fibrillation is not developed.      Relevant Orders   Protime-INR  Urinalysis, Routine w reflex microscopic   Skin infection   Erythema, swelling, and warmth noted on several fingertips with fissuring present at the fingertip near the edge of the nailbed.  No drainage is present.  It appears that she has developed some type of skin infection around the nails.  I would like to have this cleared up prior to her upcoming surgery.  She is allergic to doxycycline and amoxicillin.  We discussed the option of Keflex and she feels that she has taken this successfully in the past.  She will try this medication but let me know immediately if she has any unusual side effects.      Relevant Medications   cephALEXin (KEFLEX) 500 MG capsule      Follow up plan: Return if symptoms worsen or fail to improve.  NEXT PREVENTATIVE PHYSICAL DUE IN 1 YEAR.  PATIENT COUNSELING PROVIDED FOR ALL ADULT PATIENTS: A well balanced diet low in saturated fats, cholesterol, and moderation in carbohydrates.  This can be as simple as monitoring portion sizes and cutting back on sugary beverages such as soda and juice to start with.    Daily water consumption of at least 64 ounces.  Physical activity at least 180 minutes per week.  If just starting out, start 10 minutes  a day and work your way up.   This can be as simple as taking the stairs instead of the elevator and walking 2-3 laps around the office  purposefully every day.   STD protection, partner selection, and regular testing if high risk.  Limited consumption of alcoholic beverages if alcohol is consumed. For men, I recommend no more than 14 alcoholic beverages per week, spread out throughout the week (max 2 per day). Avoid binge drinking or consuming large quantities of alcohol in one setting.  Please let me know if you feel you may need help with reduction or quitting alcohol consumption.   Avoidance of nicotine , if used. Please let me know if you feel you may need help with reduction or quitting nicotine  use.   Daily mental health attention. This can be in the form of 5 minute daily meditation, prayer, journaling, yoga, reflection, etc.  Purposeful attention to your emotions and mental state can significantly improve your overall wellbeing  and  Health.  Please know that I am here to help you with all of your health care goals and am happy to work with you to find a solution that works best for you.  The greatest advice I have received with any changes in life are to take it one step at a time, that even means if all you can focus on is the next 60 seconds, then do that and celebrate your victories.  With any changes in life, you will have set backs, and that is OK. The important thing to remember is, if you have a set back, it is not a failure, it is an opportunity to try again! Screening Testing Mammogram Every 1 -2 years based on history and risk factors Starting at age 66 Pap Smear Ages 21-39 every 3 years Ages 84-65 every 5 years with HPV testing More frequent testing may be required based on results and history Colon Cancer Screening Every 1-10 years based on test performed, risk factors, and history Starting at age 70 Bone Density Screening Every 2-10 years based on  history Starting at age 40 for women Recommendations for men differ based on medication usage, history, and risk factors AAA Screening One time  ultrasound Men 66-47 years old who have every smoked Lung Cancer Screening Low Dose Lung CT every 12 months Age 64-80 years with a 30 pack-year smoking history who still smoke or who have quit within the last 15 years   Screening Labs Routine  Labs: Complete Blood Count (CBC), Complete Metabolic Panel (CMP), Cholesterol (Lipid Panel) Every 6-12 months based on history and medications May be recommended more frequently based on current conditions or previous results Hemoglobin A1c Lab Every 3-12 months based on history and previous results Starting at age 25 or earlier with diagnosis of diabetes, high cholesterol, BMI >26, and/or risk factors Frequent monitoring for patients with diabetes to ensure blood sugar control Thyroid  Panel (TSH) Every 6 months based on history, symptoms, and risk factors May be repeated more often if on medication HIV One time testing for all patients 45 and older May be repeated more frequently for patients with increased risk factors or exposure Hepatitis C One time testing for all patients 59 and older May be repeated more frequently for patients with increased risk factors or exposure Gonorrhea, Chlamydia Every 12 months for all sexually active persons 13-24 years Additional monitoring may be recommended for those who are considered high risk or who have symptoms Every 12 months for any woman on birth control, regardless of sexual activity PSA Men 15-61 years old with risk factors Additional screening may be recommended from age 16-69 based on risk factors, symptoms, and history  Vaccine Recommendations Tetanus Booster All adults every 10 years Flu Vaccine All patients 6 months and older every year COVID Vaccine All patients 12 years and older Initial dosing with booster May recommend additional booster  based on age and health history HPV Vaccine 2 doses all patients age 49-26 Dosing may be considered for patients over 26 Shingles Vaccine (Shingrix) 2 doses all adults 55 years and older Pneumonia (Pneumovax 23) All adults 65 years and older May recommend earlier dosing based on health history One year apart from Prevnar 13 Pneumonia (Prevnar 35) All adults 65 years and older Dosed 1 year after Pneumovax 23 Pneumonia (Prevnar 20) One time alternative to the two dosing of 13 and 23 For all adults with initial dose of 23, 20 is recommended 1 year later For all adults with initial dose of 13, 23 is still recommended as second option 1 year later

## 2024-04-14 LAB — PROTIME-INR
INR: 1 (ref 0.9–1.2)
Prothrombin Time: 10.7 s (ref 9.1–12.0)

## 2024-04-17 ENCOUNTER — Encounter: Payer: Self-pay | Admitting: Nurse Practitioner

## 2024-04-17 ENCOUNTER — Ambulatory Visit: Payer: Self-pay | Admitting: Nurse Practitioner

## 2024-04-18 ENCOUNTER — Other Ambulatory Visit: Payer: Self-pay | Admitting: *Deleted

## 2024-04-18 MED ORDER — EMGALITY 120 MG/ML ~~LOC~~ SOAJ: 120.0000 mg | SUBCUTANEOUS | 11 refills | Status: AC

## 2024-04-18 NOTE — Telephone Encounter (Signed)
 If we have one, we can give her a sample, if not, just let her know, we no longer has sample.

## 2024-04-18 NOTE — Telephone Encounter (Signed)
 Meds ordered this encounter  Medications   Galcanezumab -gnlm (EMGALITY ) 120 MG/ML SOAJ    Sig: Inject 120 mg into the skin every 30 (thirty) days.    Dispense:  1 mL    Refill:  11

## 2024-04-18 NOTE — Addendum Note (Signed)
 Addended by: Israella Hubert on: 04/18/2024 04:22 PM   Modules accepted: Orders

## 2024-04-19 ENCOUNTER — Other Ambulatory Visit

## 2024-04-20 ENCOUNTER — Ambulatory Visit: Payer: Self-pay | Admitting: Orthopedic Surgery

## 2024-05-10 ENCOUNTER — Encounter (HOSPITAL_COMMUNITY)
Admission: RE | Admit: 2024-05-10 | Discharge: 2024-05-10 | Disposition: A | Source: Ambulatory Visit | Attending: Specialist | Admitting: Specialist

## 2024-05-10 ENCOUNTER — Encounter (HOSPITAL_COMMUNITY): Payer: Self-pay

## 2024-05-10 ENCOUNTER — Other Ambulatory Visit: Payer: Self-pay

## 2024-05-10 VITALS — BP 129/62 | HR 67 | Temp 98.6°F | Resp 16 | Ht 62.0 in | Wt 170.0 lb

## 2024-05-10 DIAGNOSIS — Z01818 Encounter for other preprocedural examination: Secondary | ICD-10-CM

## 2024-05-10 DIAGNOSIS — I10 Essential (primary) hypertension: Secondary | ICD-10-CM

## 2024-05-10 HISTORY — DX: Headache, unspecified: R51.9

## 2024-05-10 HISTORY — DX: Unspecified atrial flutter: I48.92

## 2024-05-10 HISTORY — DX: Chronic obstructive pulmonary disease, unspecified: J44.9

## 2024-05-10 LAB — BASIC METABOLIC PANEL WITH GFR
Anion gap: 8 (ref 5–15)
BUN: 18 mg/dL (ref 8–23)
CO2: 30 mmol/L (ref 22–32)
Calcium: 9.1 mg/dL (ref 8.9–10.3)
Chloride: 102 mmol/L (ref 98–111)
Creatinine, Ser: 0.91 mg/dL (ref 0.44–1.00)
GFR, Estimated: >60 mL/min (ref >60)
Glucose, Bld: 99 mg/dL (ref 70–99)
Potassium: 4.6 mmol/L (ref 3.5–5.1)
Sodium: 140 mmol/L (ref 135–145)

## 2024-05-10 LAB — CBC
HCT: 37.3 % (ref 36.0–46.0)
Hemoglobin: 11.4 g/dL — ABNORMAL LOW (ref 12.0–15.0)
MCH: 26.7 pg (ref 26.0–34.0)
MCHC: 30.6 g/dL (ref 30.0–36.0)
MCV: 87.4 fL (ref 80.0–100.0)
Platelets: 257 K/uL (ref 150–400)
RBC: 4.27 MIL/uL (ref 3.87–5.11)
RDW: 13.7 % (ref 11.5–15.5)
WBC: 8.9 K/uL (ref 4.0–10.5)
nRBC: 0 % (ref 0.0–0.2)

## 2024-05-10 LAB — SURGICAL PCR SCREEN
MRSA, PCR: NEGATIVE
Staphylococcus aureus: NEGATIVE

## 2024-05-10 NOTE — Patient Instructions (Addendum)
 SURGICAL WAITING ROOM VISITATION  Patients having surgery or a procedure may have no more than 2 support people in the waiting area - these visitors may rotate.    Children under the age of 34 must have an adult with them who is not the patient.  Visitors with respiratory illnesses are discouraged from visiting and should remain at home.  If the patient needs to stay at the hospital during part of their recovery, the visitor guidelines for inpatient rooms apply. Pre-op nurse will coordinate an appropriate time for 1 support person to accompany patient in pre-op.  This support person may not rotate.    Please refer to the [facility] website for the visitor guidelines for Inpatients (after your surgery is over and you are in a regular room).    Your procedure is scheduled on: 05/17/24   Report to [facility] Main Entrance    Report to admitting at 8:50 AM   Call this number if you have problems the morning of surgery [phone]   Do not eat food :After Midnight.   After Midnight you may have the following liquids until 8:20 AM DAY OF SURGERY  Water Non-Citrus Juices (without pulp, NO RED-Apple, White grape, White cranberry) Black Coffee (NO MILK/CREAM OR CREAMERS, sugar ok)  Clear Tea (NO MILK/CREAM OR CREAMERS, sugar ok) regular and decaf                             Plain Jell-O (NO RED)                                           Fruit ices (not with fruit pulp, NO RED)                                     Popsicles (NO RED)                                                               Sports drinks like Gatorade (NO RED)     The day of surgery:  Drink ONE (1) Pre-Surgery Clear Ensure at 8:20 AM the morning of surgery. Drink in one sitting. Do not sip.  This drink was given to you during your hospital  pre-op appointment visit. Nothing else to drink after completing the  Pre-Surgery Clear Ensure.          If you have questions, please contact your surgeon's  office.   FOLLOW BOWEL PREP AND ANY ADDITIONAL PRE OP INSTRUCTIONS YOU RECEIVED FROM YOUR SURGEON'S OFFICE!!!     Oral Hygiene is also important to reduce your risk of infection.                                    Remember - BRUSH YOUR TEETH THE MORNING OF SURGERY WITH YOUR REGULAR TOOTHPASTE  DENTURES WILL BE REMOVED PRIOR TO SURGERY PLEASE DO NOT APPLY Poly grip OR ADHESIVES!!!   Stop all vitamins and herbal supplements 7 days before  surgery.   Take these medicines the morning of surgery with A SIP OF WATER: Albuterol, Pepcid, Zofran , Pantoprazole                                You may not have any metal on your body including hair pins, jewelry, and body piercing             Do not wear make-up, lotions, powders, perfumes, or deodorant  Do not wear nail polish including gel and S&S, artificial/acrylic nails, or any other type of covering on natural nails including finger and toenails. If you have artificial nails, gel coating, etc. that needs to be removed by a nail salon please have this removed prior to surgery or surgery may need to be canceled/ delayed if the surgeon/ anesthesia feels like they are unable to be safely monitored.   Do not shave  48 hours prior to surgery.    Do not bring valuables to the hospital. [facility] IS NOT             RESPONSIBLE   FOR VALUABLES.   Contacts, glasses, dentures or bridgework may not be worn into surgery.   Bring small overnight bag day of surgery.   DO NOT BRING YOUR HOME MEDICATIONS TO THE HOSPITAL. PHARMACY WILL DISPENSE MEDICATIONS LISTED ON YOUR MEDICATION LIST TO YOU DURING YOUR ADMISSION IN THE HOSPITAL!   Special Instructions: Bring a copy of your healthcare power of attorney and living will documents the day of surgery if you haven't scanned them before.              Please read over the following fact sheets you were given: IF YOU HAVE QUESTIONS ABOUT YOUR PRE-OP INSTRUCTIONS PLEASE CALL [phone].   If you  received a COVID test during your pre-op visit  it is requested that you wear a mask when out in public, stay away from anyone that may not be feeling well and notify your surgeon if you develop symptoms. If you test positive for Covid or have been in contact with anyone that has tested positive in the last 10 days please notify you surgeon.      Pre-operative 4 CHG Bath Instructions  DYNA-Hex 4 Chlorhexidine Gluconate 4% Solution Antiseptic 4 fl. oz   You can play a key role in reducing the risk of infection after surgery. Your skin needs to be as free of germs as possible. You can reduce the number of germs on your skin by washing with CHG (chlorhexidine gluconate) soap before surgery. CHG is an antiseptic soap that kills germs and continues to kill germs even after washing.   DO NOT use if you have an allergy to chlorhexidine/CHG or antibacterial soaps. If your skin becomes reddened or irritated, stop using the CHG and notify one of our RNs at   Please shower with the CHG soap starting 4 days before surgery using the following schedule:     Please keep in mind the following:  DO NOT shave, including legs and underarms, starting the day of your first shower.   You may shave your face at any point before/day of surgery.  Place clean sheets on your bed the day you start using CHG soap. Use a clean washcloth (not used since being washed) for each shower. DO NOT sleep with pets once you start using the CHG.  CHG Shower Instructions:  If you choose to wash your hair  and private area, wash first with your normal shampoo/soap.  After you use shampoo/soap, rinse your hair and body thoroughly to remove shampoo/soap residue.  Turn the water OFF and apply about 3 tablespoons (45 ml) of CHG soap to a CLEAN washcloth.  Apply CHG soap ONLY FROM YOUR NECK DOWN TO YOUR TOES (washing for 3-5 minutes)  DO NOT use CHG soap on face, private areas, open wounds, or sores.  Pay special attention to the area  where your surgery is being performed.  If you are having back surgery, having someone wash your back for you may be helpful. Wait 2 minutes after CHG soap is applied, then you may rinse off the CHG soap.  Pat dry with a clean towel  Put on clean clothes/pajamas   If you choose to wear lotion, please use ONLY the CHG-compatible lotions on the back of this paper.     Additional instructions for the day of surgery: DO NOT APPLY any lotions, deodorants, cologne, or perfumes.   Put on clean/comfortable clothes.  Brush your teeth.  Ask your nurse before applying any prescription medications to the skin.   CHG Compatible Lotions   Aveeno Moisturizing lotion  Cetaphil Moisturizing Cream  Cetaphil Moisturizing Lotion  Clairol Herbal Essence Moisturizing Lotion, Dry Skin  Clairol Herbal Essence Moisturizing Lotion, Extra Dry Skin  Clairol Herbal Essence Moisturizing Lotion, Normal Skin  Curel Age Defying Therapeutic Moisturizing Lotion with Alpha Hydroxy  Curel Extreme Care Body Lotion  Curel Soothing Hands Moisturizing Hand Lotion  Curel Therapeutic Moisturizing Cream, Fragrance-Free  Curel Therapeutic Moisturizing Lotion, Fragrance-Free  Curel Therapeutic Moisturizing Lotion, Original Formula  Eucerin Daily Replenishing Lotion  Eucerin Dry Skin Therapy Plus Alpha Hydroxy Crme  Eucerin Dry Skin Therapy Plus Alpha Hydroxy Lotion  Eucerin Original Crme  Eucerin Original Lotion  Eucerin Plus Crme Eucerin Plus Lotion  Eucerin TriLipid Replenishing Lotion  Keri Anti-Bacterial Hand Lotion  Keri Deep Conditioning Original Lotion Dry Skin Formula Softly Scented  Keri Deep Conditioning Original Lotion, Fragrance Free Sensitive Skin Formula  Keri Lotion Fast Absorbing Fragrance Free Sensitive Skin Formula  Keri Lotion Fast Absorbing Softly Scented Dry Skin Formula  Keri Original Lotion  Keri Skin Renewal Lotion Keri Silky Smooth Lotion  Keri Silky Smooth Sensitive Skin Lotion  Nivea Body  Creamy Conditioning Oil  Nivea Body Extra Enriched Lotion  Nivea Body Original Lotion  Nivea Body Sheer Moisturizing Lotion Nivea Crme  Nivea Skin Firming Lotion  NutraDerm 30 Skin Lotion  NutraDerm Skin Lotion  NutraDerm Therapeutic Skin Cream  NutraDerm Therapeutic Skin Lotion  ProShield Protective Hand Cream  Provon moisturizing lotionView   Pre-Surgery Education Videos:  [email or web address]     Incentive Spirometer  An incentive spirometer is a tool that can help keep your lungs clear and active. This tool measures how well you are filling your lungs with each breath. Taking long deep breaths may help reverse or decrease the chance of developing breathing (pulmonary) problems (especially infection) following: A long period of time when you are unable to move or be active. BEFORE THE PROCEDURE  If the spirometer includes an indicator to show your best effort, your nurse or respiratory therapist will set it to a desired goal. If possible, sit up straight or lean slightly forward. Try not to slouch. Hold the incentive spirometer in an upright position. INSTRUCTIONS FOR USE  Sit on the edge of your bed if possible, or sit up as far as you can in bed or on a chair. Hold the  incentive spirometer in an upright position. Breathe out normally. Place the mouthpiece in your mouth and seal your lips tightly around it. Breathe in slowly and as deeply as possible, raising the piston or the ball toward the top of the column. Hold your breath for 3-5 seconds or for as long as possible. Allow the piston or ball to fall to the bottom of the column. Remove the mouthpiece from your mouth and breathe out normally. Rest for a few seconds and repeat Steps 1 through 7 at least 10 times every 1-2 hours when you are awake. Take your time and take a few normal breaths between deep breaths. The spirometer may include an indicator to show your  best effort. Use the indicator as a goal to work toward during each repetition. After each set of 10 deep breaths, practice coughing to be sure your lungs are clear. If you have an incision (the cut made at the time of surgery), support your incision when coughing by placing a pillow or rolled up towels firmly against it. Once you are able to get out of bed, walk around indoors and cough well. You may stop using the incentive spirometer when instructed by your caregiver.  RISKS AND COMPLICATIONS Take your time so you do not get dizzy or light-headed. If you are in pain, you may need to take or ask for pain medication before doing incentive spirometry. It is harder to take a deep breath if you are having pain. AFTER USE Rest and breathe slowly and easily. It can be helpful to keep track of a log of your progress. Your caregiver can provide you with a simple table to help with this. If you are using the spirometer at home, follow these instructions: SEEK MEDICAL CARE IF:  You are having difficultly using the spirometer. You have trouble using the spirometer as often as instructed. Your pain medication is not giving enough relief while using the spirometer. You develop fever of 100.5 F (38.1 C) or higher. SEEK IMMEDIATE MEDICAL CARE IF:  You cough up bloody sputum that had not been present before. You develop fever of 102 F (38.9 C) or greater. You develop worsening pain at or near the incision site. MAKE SURE YOU:  Understand these instructions. Will watch your condition. Will get help right away if you are not doing well or get worse. Document Released: 10/05/2006 Document Revised: 08/17/2011 Document Reviewed: 12/06/2006 [facility] Patient Information 2014 [place], [place].   ________________________________________________________________________

## 2024-05-10 NOTE — Progress Notes (Addendum)
 Date of COVID positive in last 90 days:  PCP - Camie Doing, NP Cardiologist - Redell Shallow, MD Electrophysiologist- Eulas Furbish, MD  Cardiac clearance in media tab dated 04/19/24 Medical clearance by Camie Doing, NP 04/13/24 in Epic   Chest x-ray - 06/05/23 [facility] EKG - 03/06/24 Epic Stress Test - N/A ECHO - 11/18/20 Epic Cardiac Cath - N/A Ablation- 07/26/23 Epic Pacemaker/ICD device last checked:N/A Spinal Cord Stimulator:N/A  Bowel Prep - N/A  Sleep Study - N/A CPAP -   Fasting Blood Sugar - N/A Checks Blood Sugar _____ times a day  Last dose of GLP1 agonist-  N/A GLP1 instructions:  Do not take after     Last dose of SGLT-2 inhibitors-  N/A SGLT-2 instructions:  Do not take after     Blood Thinner Instructions: N/A Last dose:   Time: Aspirin  Instructions:N/A Last Dose:  Activity level: Can go up a flight of stairs and perform activities of daily living without stopping and without symptoms of chest pain or shortness of breath.   Anesthesia review: HTN, a fib, COPD, fatty liver  Patient denies shortness of breath, fever, cough and chest pain at PAT appointment  Patient verbalized understanding of instructions that were given to them at the PAT appointment. Patient was also instructed that they will need to review over the PAT instructions again at home before surgery.

## 2024-05-11 NOTE — H&P (View-Only) (Signed)
 Case: [id number] Date/Time: 05/17/24 1107   Procedure: ARTHROPLASTY, KNEE, TOTAL (Left: Knee)   Anesthesia type: Spinal   Diagnosis: Primary osteoarthritis of left knee [M17.12]   Pre-op diagnosis: degenerative joint disease   Location: [facility] ROOM 10 / [facility]   Surgeons: Duwayne Purchase, MD       DISCUSSION: Amanda [name] is a 68 year old female past medical history of former smoking, A-fib s/p ablation (07/2023), COPD, headaches, GERD, arthritis, anxiety, depression, bipolar, anemia  Patient follows with cardio for history of A-fib.  She underwent ablation in 07/2023.  Last seen by EP on 03/06/2024.  She denies any cardiac symptoms.  EKG showed she is in sinus rhythm.  Cardiac clearance televisit done on 11/4 and patient was cleared:  Preoperative Cardiovascular Risk Assessment: According to the Revised Cardiac Risk Index (RCRI), her Perioperative Risk of Major Cardiac Event is (%): 0.9. Her Functional Capacity in METs is: 7.19 according to the Duke Activity Status Index (DASI). The patient is doing well from a cardiac perspective. Therefore, based on ACC/AHA guidelines, the patient would be at acceptable risk for the planned procedure without further cardiovascular testing.  Seen by PCP for preop exam on 04/13/2024.  She was treated for a fingernail infection with Keflex .  COPD controlled.  Cleared from a medical standpoint per NP Early:  Physical exam completed today with no concerning findings at this time.  Patient does have a history of COPD however this does appear to be well-controlled with no alarm symptoms present.  I do strongly recommend early pulmonary toileting to reduce risks after procedure.  History of atrial fibrillation with recent clearance from cardiology to follow only once annually.  She also has been taken off of her anticoagulant therapy.  Heart rate and rhythm were regular today with no concerning symptoms.  I do recommend cardiology clearance given the increased risks of  blood clots following surgery and in the setting of atrial fibrillation.  Recommend close cardiac monitoring during and after procedure to ensure that atrial fibrillation is not developed.   VS: BP 129/62   Pulse 67   Temp 37 C (Oral)   Resp 16   Ht 5' 2 (1.575 m)   Wt 77.1 kg   SpO2 96%   BMI 31.09 kg/m   PROVIDERS: Early, Camie BRAVO, NP   LABS: Labs reviewed: Acceptable for surgery. (all labs ordered are listed, but only abnormal results are displayed)  Labs Reviewed  CBC - Abnormal; Notable for the following components:      Result Value   Hemoglobin 11.4 (*)    All other components within normal limits  SURGICAL PCR SCREEN  BASIC METABOLIC PANEL WITH GFR    EKG 03/06/2024:  Normal sinus rhythm Low voltage QRS  Cardiac CT 07/02/2023:   IMPRESSION: 1. There is normal pulmonary vein drainage into the left atrium.   2. There is no thrombus in the left atrial appendage on the delayed images.   3. The esophagus runs in the left atrial midline and is not in proximity to any of the pulmonary vein ostia.   4. No PFO / ASD.   5. Aortic atherosclerosis.   5. Normal coronary origin. Right dominance.   6. CAC score of 1 which is 52nd percentile for age-, race-, and sex-matched controls.  Echo 11/20/2020:   IMPRESSIONS    1. Technically difficlut echo with poor image quality.  2. Left ventricular ejection fraction, by estimation, is 60 to 65%. The left ventricle has normal function. The left  ventricle has no regional wall motion abnormalities. Left ventricular diastolic parameters were normal.  3. Right ventricular systolic function is normal. The right ventricular size is normal.  4. The mitral valve was not well visualized. No evidence of mitral valve regurgitation.  5. The aortic valve was not well visualized. Aortic valve regurgitation is not visualized. No aortic stenosis is present.  Past Medical History:  Diagnosis Date   Acute urinary retention  03/30/2024   Arthritis    R foot and L knee   Atrial fib/flutter, transient (HCC)    Atrial fibrillation (HCC)    COPD (chronic obstructive pulmonary disease) (HCC)    DEGENERATIVE JOINT DISEASE 11/12/2006   DISORDER, BIPOLAR NOS 02/28/2007   Easy bruising 01/17/2023   GERD 11/12/2006   Headache    migraines   Major depressive disorder, single episode, unspecified 07/15/2021    Past Surgical History:  Procedure Laterality Date   ABDOMINAL HYSTERECTOMY     ATRIAL FIBRILLATION ABLATION N/A 07/26/2023   Procedure: ATRIAL FIBRILLATION ABLATION;  Surgeon: Nancey Eulas BRAVO, MD;  Location: [facility] INVASIVE CV LAB;  Service: Cardiovascular;  Laterality: N/A;   BREAST SURGERY     reduction   KNEE ARTHROSCOPY Right    TONSILLECTOMY      MEDICATIONS:  albuterol (VENTOLIN HFA) 108 (90 Base) MCG/ACT inhaler   baclofen (LIORESAL) 10 MG tablet   Buprenorphine  HCl-Naloxone  HCl 1.4-0.36 MG SUBL   CELEBREX 200 MG capsule   citalopram  (CELEXA ) 20 MG tablet   estradiol (CLIMARA - DOSED IN MG/24 HR) 0.1 mg/24hr patch   ezetimibe (ZETIA) 10 MG tablet   famotidine (PEPCID) 20 MG tablet   furosemide  (LASIX ) 40 MG tablet   gabapentin  (NEURONTIN ) 100 MG capsule   Galcanezumab -gnlm (EMGALITY ) 120 MG/ML SOAJ   lamoTRIgine  (LAMICTAL ) 100 MG tablet   magnesium oxide (MAG-OX) 400 (240 Mg) MG tablet   meloxicam (MOBIC) 7.5 MG tablet   mometasone  (ELOCON ) 0.1 % cream   ondansetron  (ZOFRAN -ODT) 4 MG disintegrating tablet   pantoprazole  (PROTONIX ) 40 MG tablet   risperiDONE  (RISPERDAL ) 1 MG tablet   Vitamin D , Ergocalciferol , (DRISDOL ) 1.25 MG (50000 UNIT) CAPS capsule   No current facility-administered medications for this encounter.   Burnard CHRISTELLA Odis DEVONNA [facility] Surgical Short Stay/Anesthesiology [facility] Phone [phone] 05/11/2024 9:49 AM

## 2024-05-11 NOTE — Anesthesia Preprocedure Evaluation (Addendum)
 Anesthesia Evaluation  Patient identified by MRN, date of birth, ID band Patient awake    Reviewed: Allergy & Precautions, NPO status , Patient's Chart, lab work & pertinent test results  Airway Mallampati: II  TM Distance: >3 FB Neck ROM: Full    Dental   Pulmonary COPD, Patient abstained from smoking., former smoker   breath sounds clear to auscultation       Cardiovascular hypertension, Pt. on medications  Rhythm:Regular Rate:Normal     Neuro/Psych  Neuromuscular disease    GI/Hepatic Neg liver ROS,GERD  ,,  Endo/Other  Hypothyroidism    Renal/GU negative Renal ROS     Musculoskeletal  (+) Arthritis ,    Abdominal   Peds  Hematology negative hematology ROS (+)   Anesthesia Other Findings   Reproductive/Obstetrics                              Anesthesia Physical Anesthesia Plan  ASA: 2  Anesthesia Plan: Spinal   Post-op Pain Management: Regional block* and Ofirmev  IV (intra-op)*   Induction:   PONV Risk Score and Plan: 2 and Propofol  infusion, Dexamethasone , Ondansetron  and Treatment may vary due to age or medical condition  Airway Management Planned: Natural Airway and Simple Face Mask  Additional Equipment:   Intra-op Plan:   Post-operative Plan:   Informed Consent: I have reviewed the patients History and Physical, chart, labs and discussed the procedure including the risks, benefits and alternatives for the proposed anesthesia with the patient or authorized representative who has indicated his/her understanding and acceptance.       Plan Discussed with: CRNA  Anesthesia Plan Comments: (See PAT note from 12/3 )         Anesthesia Quick Evaluation

## 2024-05-11 NOTE — Progress Notes (Signed)
 Case: [id number] Date/Time: 05/17/24 1107   Procedure: ARTHROPLASTY, KNEE, TOTAL (Left: Knee)   Anesthesia type: Spinal   Diagnosis: Primary osteoarthritis of left knee [M17.12]   Pre-op diagnosis: degenerative joint disease   Location: [facility] ROOM 10 / [facility]   Surgeons: Duwayne Purchase, MD       DISCUSSION: Amanda [name] is a 68 year old female past medical history of former smoking, A-fib s/p ablation (07/2023), COPD, headaches, GERD, arthritis, anxiety, depression, bipolar, anemia  Patient follows with cardio for history of A-fib.  She underwent ablation in 07/2023.  Last seen by EP on 03/06/2024.  She denies any cardiac symptoms.  EKG showed she is in sinus rhythm.  Cardiac clearance televisit done on 11/4 and patient was cleared:  Preoperative Cardiovascular Risk Assessment: According to the Revised Cardiac Risk Index (RCRI), her Perioperative Risk of Major Cardiac Event is (%): 0.9. Her Functional Capacity in METs is: 7.19 according to the Duke Activity Status Index (DASI). The patient is doing well from a cardiac perspective. Therefore, based on ACC/AHA guidelines, the patient would be at acceptable risk for the planned procedure without further cardiovascular testing.  Seen by PCP for preop exam on 04/13/2024.  She was treated for a fingernail infection with Keflex .  COPD controlled.  Cleared from a medical standpoint per NP Early:  Physical exam completed today with no concerning findings at this time.  Patient does have a history of COPD however this does appear to be well-controlled with no alarm symptoms present.  I do strongly recommend early pulmonary toileting to reduce risks after procedure.  History of atrial fibrillation with recent clearance from cardiology to follow only once annually.  She also has been taken off of her anticoagulant therapy.  Heart rate and rhythm were regular today with no concerning symptoms.  I do recommend cardiology clearance given the increased risks of  blood clots following surgery and in the setting of atrial fibrillation.  Recommend close cardiac monitoring during and after procedure to ensure that atrial fibrillation is not developed.   VS: BP 129/62   Pulse 67   Temp 37 C (Oral)   Resp 16   Ht 5' 2 (1.575 m)   Wt 77.1 kg   SpO2 96%   BMI 31.09 kg/m   PROVIDERS: Early, Camie BRAVO, NP   LABS: Labs reviewed: Acceptable for surgery. (all labs ordered are listed, but only abnormal results are displayed)  Labs Reviewed  CBC - Abnormal; Notable for the following components:      Result Value   Hemoglobin 11.4 (*)    All other components within normal limits  SURGICAL PCR SCREEN  BASIC METABOLIC PANEL WITH GFR    EKG 03/06/2024:  Normal sinus rhythm Low voltage QRS  Cardiac CT 07/02/2023:   IMPRESSION: 1. There is normal pulmonary vein drainage into the left atrium.   2. There is no thrombus in the left atrial appendage on the delayed images.   3. The esophagus runs in the left atrial midline and is not in proximity to any of the pulmonary vein ostia.   4. No PFO / ASD.   5. Aortic atherosclerosis.   5. Normal coronary origin. Right dominance.   6. CAC score of 1 which is 52nd percentile for age-, race-, and sex-matched controls.  Echo 11/20/2020:   IMPRESSIONS    1. Technically difficlut echo with poor image quality.  2. Left ventricular ejection fraction, by estimation, is 60 to 65%. The left ventricle has normal function. The left  ventricle has no regional wall motion abnormalities. Left ventricular diastolic parameters were normal.  3. Right ventricular systolic function is normal. The right ventricular size is normal.  4. The mitral valve was not well visualized. No evidence of mitral valve regurgitation.  5. The aortic valve was not well visualized. Aortic valve regurgitation is not visualized. No aortic stenosis is present.  Past Medical History:  Diagnosis Date   Acute urinary retention  03/30/2024   Arthritis    R foot and L knee   Atrial fib/flutter, transient (HCC)    Atrial fibrillation (HCC)    COPD (chronic obstructive pulmonary disease) (HCC)    DEGENERATIVE JOINT DISEASE 11/12/2006   DISORDER, BIPOLAR NOS 02/28/2007   Easy bruising 01/17/2023   GERD 11/12/2006   Headache    migraines   Major depressive disorder, single episode, unspecified 07/15/2021    Past Surgical History:  Procedure Laterality Date   ABDOMINAL HYSTERECTOMY     ATRIAL FIBRILLATION ABLATION N/A 07/26/2023   Procedure: ATRIAL FIBRILLATION ABLATION;  Surgeon: Nancey Eulas BRAVO, MD;  [facility];  Service: Cardiovascular;  Laterality: N/A;   BREAST SURGERY     reduction   KNEE ARTHROSCOPY Right    TONSILLECTOMY      MEDICATIONS:  albuterol (VENTOLIN HFA) 108 (90 Base) MCG/ACT inhaler   baclofen (LIORESAL) 10 MG tablet   Buprenorphine  HCl-Naloxone  HCl 1.4-0.36 MG SUBL   CELEBREX 200 MG capsule   citalopram  (CELEXA ) 20 MG tablet   estradiol (CLIMARA - DOSED IN MG/24 HR) 0.1 mg/24hr patch   ezetimibe (ZETIA) 10 MG tablet   famotidine (PEPCID) 20 MG tablet   furosemide  (LASIX ) 40 MG tablet   gabapentin  (NEURONTIN ) 100 MG capsule   Galcanezumab -gnlm (EMGALITY ) 120 MG/ML SOAJ   lamoTRIgine  (LAMICTAL ) 100 MG tablet   magnesium oxide (MAG-OX) 400 (240 Mg) MG tablet   meloxicam (MOBIC) 7.5 MG tablet   mometasone  (ELOCON ) 0.1 % cream   ondansetron  (ZOFRAN -ODT) 4 MG disintegrating tablet   pantoprazole  (PROTONIX ) 40 MG tablet   risperiDONE  (RISPERDAL ) 1 MG tablet   Vitamin D , Ergocalciferol , (DRISDOL ) 1.25 MG (50000 UNIT) CAPS capsule   No current facility-administered medications for this encounter.   Burnard CHRISTELLA Odis DEVONNA [facility] Short Stay/Anesthesiology [facility] Phone [phone] 05/11/2024 9:49 AM

## 2024-05-15 ENCOUNTER — Encounter: Payer: Self-pay | Admitting: Nurse Practitioner

## 2024-05-16 ENCOUNTER — Other Ambulatory Visit

## 2024-05-16 ENCOUNTER — Other Ambulatory Visit: Payer: Self-pay

## 2024-05-16 ENCOUNTER — Telehealth: Payer: Self-pay

## 2024-05-16 DIAGNOSIS — R350 Frequency of micturition: Secondary | ICD-10-CM

## 2024-05-16 LAB — POCT URINALYSIS DIP (CLINITEK)
Bilirubin, UA: NEGATIVE
Blood, UA: NEGATIVE
Glucose, UA: NEGATIVE mg/dL
Ketones, POC UA: NEGATIVE mg/dL
Leukocytes, UA: NEGATIVE
Nitrite, UA: NEGATIVE
POC PROTEIN,UA: NEGATIVE
Spec Grav, UA: 1.02 (ref 1.010–1.025)
Urobilinogen, UA: 0.2 U/dL
pH, UA: 6 (ref 5.0–8.0)

## 2024-05-16 NOTE — Telephone Encounter (Signed)
 Pt. Came in for urine check per message she is going frequently at night and is worried about falling after she has her surgery tomorrow. I ran her urine and sent those results to you it looked normal. She wanted to know if you were going to call her in something today because her surgery is tomorrow.

## 2024-05-17 ENCOUNTER — Observation Stay (HOSPITAL_COMMUNITY)
Admission: RE | Admit: 2024-05-17 | Discharge: 2024-05-20 | Disposition: A | Source: Ambulatory Visit | Attending: Specialist | Admitting: Specialist

## 2024-05-17 ENCOUNTER — Encounter (HOSPITAL_COMMUNITY): Admission: RE | Disposition: A | Payer: Self-pay | Source: Ambulatory Visit | Attending: Specialist

## 2024-05-17 ENCOUNTER — Other Ambulatory Visit: Payer: Self-pay

## 2024-05-17 ENCOUNTER — Ambulatory Visit (HOSPITAL_COMMUNITY): Admitting: Anesthesiology

## 2024-05-17 ENCOUNTER — Ambulatory Visit (HOSPITAL_COMMUNITY)

## 2024-05-17 ENCOUNTER — Encounter (HOSPITAL_COMMUNITY): Payer: Self-pay | Admitting: Specialist

## 2024-05-17 ENCOUNTER — Ambulatory Visit: Payer: Self-pay | Admitting: Orthopedic Surgery

## 2024-05-17 ENCOUNTER — Encounter (HOSPITAL_COMMUNITY): Payer: Self-pay | Admitting: Medical

## 2024-05-17 ENCOUNTER — Ambulatory Visit: Payer: Self-pay | Admitting: Nurse Practitioner

## 2024-05-17 ENCOUNTER — Other Ambulatory Visit: Payer: Self-pay | Admitting: Nurse Practitioner

## 2024-05-17 DIAGNOSIS — J449 Chronic obstructive pulmonary disease, unspecified: Secondary | ICD-10-CM | POA: Diagnosis not present

## 2024-05-17 DIAGNOSIS — M25562 Pain in left knee: Secondary | ICD-10-CM | POA: Diagnosis present

## 2024-05-17 DIAGNOSIS — M1712 Unilateral primary osteoarthritis, left knee: Secondary | ICD-10-CM

## 2024-05-17 DIAGNOSIS — Z87891 Personal history of nicotine dependence: Secondary | ICD-10-CM | POA: Insufficient documentation

## 2024-05-17 DIAGNOSIS — E039 Hypothyroidism, unspecified: Secondary | ICD-10-CM | POA: Diagnosis not present

## 2024-05-17 DIAGNOSIS — I10 Essential (primary) hypertension: Secondary | ICD-10-CM | POA: Diagnosis not present

## 2024-05-17 DIAGNOSIS — Z79899 Other long term (current) drug therapy: Secondary | ICD-10-CM | POA: Diagnosis not present

## 2024-05-17 HISTORY — PX: TOTAL KNEE ARTHROPLASTY: SHX125

## 2024-05-17 SURGERY — ARTHROPLASTY, KNEE, TOTAL
Anesthesia: Spinal | Site: Knee | Laterality: Left

## 2024-05-17 MED ORDER — CEFAZOLIN SODIUM-DEXTROSE 2-4 GM/100ML-% IV SOLN
2.0000 g | INTRAVENOUS | Status: AC
  Administered 2024-05-17: 2 g via INTRAVENOUS
  Filled 2024-05-17: qty 100

## 2024-05-17 MED ORDER — MENTHOL 3 MG MT LOZG: 1.0000 | LOZENGE | OROMUCOSAL | Status: DC | PRN

## 2024-05-17 MED ORDER — 0.9 % SODIUM CHLORIDE (POUR BTL) OPTIME
TOPICAL | Status: DC | PRN
  Administered 2024-05-17: 1000 mL

## 2024-05-17 MED ORDER — ACETAMINOPHEN 10 MG/ML IV SOLN
1000.0000 mg | INTRAVENOUS | Status: AC
  Administered 2024-05-17: 1000 mg via INTRAVENOUS
  Filled 2024-05-17: qty 100

## 2024-05-17 MED ORDER — BUPIVACAINE LIPOSOME 1.3 % IJ SUSP
INTRAMUSCULAR | Status: AC
  Filled 2024-05-17: qty 20

## 2024-05-17 MED ORDER — FUROSEMIDE 40 MG PO TABS: 40.0000 mg | ORAL_TABLET | Freq: Every day | ORAL | Status: DC | PRN

## 2024-05-17 MED ORDER — ONDANSETRON 4 MG PO TBDP
4.0000 mg | ORAL_TABLET | Freq: Three times a day (TID) | ORAL | Status: DC | PRN
  Administered 2024-05-18 (×2): 4 mg via ORAL
  Filled 2024-05-17 (×2): qty 1

## 2024-05-17 MED ORDER — EZETIMIBE 10 MG PO TABS
10.0000 mg | ORAL_TABLET | Freq: Every day | ORAL | Status: DC
  Administered 2024-05-17 – 2024-05-19 (×3): 10 mg via ORAL
  Filled 2024-05-17 (×3): qty 1

## 2024-05-17 MED ORDER — MAGNESIUM OXIDE -MG SUPPLEMENT 400 (240 MG) MG PO TABS
400.0000 mg | ORAL_TABLET | Freq: Two times a day (BID) | ORAL | Status: DC
  Administered 2024-05-17 – 2024-05-20 (×6): 400 mg via ORAL
  Filled 2024-05-17 (×6): qty 1

## 2024-05-17 MED ORDER — LACTATED RINGERS IV SOLN: INTRAVENOUS | Status: DC | PRN

## 2024-05-17 MED ORDER — METOCLOPRAMIDE HCL 5 MG/ML IJ SOLN: 5.0000 mg | Freq: Three times a day (TID) | INTRAMUSCULAR | Status: DC | PRN

## 2024-05-17 MED ORDER — ESTRADIOL 0.1 MG/24HR TD PTWK: 0.1000 mg | MEDICATED_PATCH | Freq: Every day | TRANSDERMAL | Status: DC | PRN

## 2024-05-17 MED ORDER — DIPHENHYDRAMINE HCL 12.5 MG/5ML PO ELIX
12.5000 mg | ORAL_SOLUTION | ORAL | Status: DC | PRN
  Administered 2024-05-18: 25 mg via ORAL
  Filled 2024-05-17: qty 10

## 2024-05-17 MED ORDER — GABAPENTIN 100 MG PO CAPS
100.0000 mg | ORAL_CAPSULE | Freq: Every day | ORAL | Status: DC
  Administered 2024-05-17 – 2024-05-19 (×3): 100 mg via ORAL
  Filled 2024-05-17 (×3): qty 1

## 2024-05-17 MED ORDER — RISAQUAD PO CAPS
1.0000 | ORAL_CAPSULE | Freq: Every day | ORAL | Status: DC
  Administered 2024-05-17 – 2024-05-20 (×4): 1 via ORAL
  Filled 2024-05-17 (×4): qty 1

## 2024-05-17 MED ORDER — ORAL CARE MOUTH RINSE: 15.0000 mL | Freq: Once | OROMUCOSAL | Status: AC

## 2024-05-17 MED ORDER — BUPIVACAINE-EPINEPHRINE 0.25% -1:200000 IJ SOLN
INTRAMUSCULAR | Status: DC | PRN
  Administered 2024-05-17: 30 mL

## 2024-05-17 MED ORDER — BUPRENORPHINE HCL-NALOXONE HCL 2-0.5 MG SL SUBL
2.0000 | SUBLINGUAL_TABLET | Freq: Every day | SUBLINGUAL | Status: DC
  Filled 2024-05-17 (×2): qty 2

## 2024-05-17 MED ORDER — CELECOXIB 200 MG PO CAPS
200.0000 mg | ORAL_CAPSULE | Freq: Every day | ORAL | Status: DC
  Administered 2024-05-18 – 2024-05-20 (×3): 200 mg via ORAL
  Filled 2024-05-17 (×3): qty 1

## 2024-05-17 MED ORDER — CITALOPRAM HYDROBROMIDE 20 MG PO TABS
20.0000 mg | ORAL_TABLET | Freq: Every day | ORAL | Status: DC
  Administered 2024-05-17 – 2024-05-19 (×3): 20 mg via ORAL
  Filled 2024-05-17 (×3): qty 1

## 2024-05-17 MED ORDER — ROPIVACAINE HCL 5 MG/ML IJ SOLN
INTRAMUSCULAR | Status: DC | PRN
  Administered 2024-05-17: 20 mL via PERINEURAL

## 2024-05-17 MED ORDER — POLYETHYLENE GLYCOL 3350 17 G PO PACK
17.0000 g | PACK | Freq: Every day | ORAL | Status: DC
  Administered 2024-05-18 – 2024-05-20 (×3): 17 g via ORAL
  Filled 2024-05-17 (×4): qty 1

## 2024-05-17 MED ORDER — ONDANSETRON HCL 4 MG/2ML IJ SOLN
INTRAMUSCULAR | Status: AC
  Filled 2024-05-17: qty 2

## 2024-05-17 MED ORDER — ACETAMINOPHEN 500 MG PO TABS
1000.0000 mg | ORAL_TABLET | Freq: Four times a day (QID) | ORAL | Status: AC
  Administered 2024-05-17 – 2024-05-18 (×3): 1000 mg via ORAL
  Filled 2024-05-17 (×4): qty 2

## 2024-05-17 MED ORDER — CLONIDINE HCL (ANALGESIA) 100 MCG/ML EP SOLN
EPIDURAL | Status: DC | PRN
  Administered 2024-05-17: 50 ug

## 2024-05-17 MED ORDER — METOCLOPRAMIDE HCL 5 MG PO TABS: 5.0000 mg | ORAL_TABLET | Freq: Three times a day (TID) | ORAL | Status: DC | PRN

## 2024-05-17 MED ORDER — SODIUM CHLORIDE (PF) 0.9 % IJ SOLN
INTRAMUSCULAR | Status: DC | PRN
  Administered 2024-05-17: 60 mL

## 2024-05-17 MED ORDER — SODIUM CHLORIDE 0.9 % IR SOLN
Status: DC | PRN
  Administered 2024-05-17: 3000 mL

## 2024-05-17 MED ORDER — OXYCODONE HCL 5 MG PO TABS
10.0000 mg | ORAL_TABLET | ORAL | Status: DC | PRN
  Administered 2024-05-18 – 2024-05-20 (×13): 15 mg via ORAL
  Filled 2024-05-17 (×13): qty 3

## 2024-05-17 MED ORDER — BUPIVACAINE-EPINEPHRINE (PF) 0.25% -1:200000 IJ SOLN
INTRAMUSCULAR | Status: AC
  Filled 2024-05-17: qty 30

## 2024-05-17 MED ORDER — SODIUM CHLORIDE (PF) 0.9 % IJ SOLN
INTRAMUSCULAR | Status: AC
  Filled 2024-05-17: qty 200

## 2024-05-17 MED ORDER — VITAMIN D (ERGOCALCIFEROL) 1.25 MG (50000 UNIT) PO CAPS
50000.0000 [IU] | ORAL_CAPSULE | ORAL | Status: DC
  Administered 2024-05-19: 50000 [IU] via ORAL
  Filled 2024-05-17: qty 1

## 2024-05-17 MED ORDER — HYDROMORPHONE HCL 1 MG/ML IJ SOLN
0.5000 mg | INTRAMUSCULAR | Status: DC | PRN
  Administered 2024-05-17 – 2024-05-19 (×6): 1 mg via INTRAVENOUS
  Filled 2024-05-17 (×6): qty 1

## 2024-05-17 MED ORDER — MIDAZOLAM HCL (PF) 2 MG/2ML IJ SOLN
2.0000 mg | INTRAMUSCULAR | Status: DC
  Administered 2024-05-17: 1 mg via INTRAVENOUS
  Filled 2024-05-17: qty 2

## 2024-05-17 MED ORDER — DEXAMETHASONE SOD PHOSPHATE PF 10 MG/ML IJ SOLN
INTRAMUSCULAR | Status: DC | PRN
  Administered 2024-05-17: 8 mg via INTRAVENOUS

## 2024-05-17 MED ORDER — OXYBUTYNIN CHLORIDE ER 5 MG PO TB24: 5.0000 mg | ORAL_TABLET | Freq: Every day | ORAL | 1 refills | Status: AC

## 2024-05-17 MED ORDER — PANTOPRAZOLE SODIUM 40 MG PO TBEC
40.0000 mg | DELAYED_RELEASE_TABLET | Freq: Every day | ORAL | Status: DC
  Administered 2024-05-18 – 2024-05-20 (×3): 40 mg via ORAL
  Filled 2024-05-17 (×3): qty 1

## 2024-05-17 MED ORDER — STERILE WATER FOR IRRIGATION IR SOLN
Status: DC | PRN
  Administered 2024-05-17: 2000 mL

## 2024-05-17 MED ORDER — ASPIRIN 81 MG PO CHEW
81.0000 mg | CHEWABLE_TABLET | Freq: Two times a day (BID) | ORAL | Status: DC
  Administered 2024-05-18 – 2024-05-20 (×4): 81 mg via ORAL
  Filled 2024-05-17 (×4): qty 1

## 2024-05-17 MED ORDER — BACLOFEN 10 MG PO TABS
10.0000 mg | ORAL_TABLET | Freq: Two times a day (BID) | ORAL | Status: DC
  Administered 2024-05-17 – 2024-05-20 (×6): 10 mg via ORAL
  Filled 2024-05-17 (×6): qty 1

## 2024-05-17 MED ORDER — DOCUSATE SODIUM 100 MG PO CAPS
100.0000 mg | ORAL_CAPSULE | Freq: Two times a day (BID) | ORAL | Status: DC
  Administered 2024-05-17 – 2024-05-20 (×6): 100 mg via ORAL
  Filled 2024-05-17 (×6): qty 1

## 2024-05-17 MED ORDER — RISPERIDONE 1 MG PO TABS
1.0000 mg | ORAL_TABLET | Freq: Every day | ORAL | Status: DC
  Administered 2024-05-17 – 2024-05-19 (×3): 1 mg via ORAL
  Filled 2024-05-17 (×3): qty 1

## 2024-05-17 MED ORDER — LACTATED RINGERS IV SOLN: INTRAVENOUS | Status: DC

## 2024-05-17 MED ORDER — MOMETASONE FUROATE 0.1 % EX CREA: 1.0000 | TOPICAL_CREAM | Freq: Every evening | CUTANEOUS | Status: DC | PRN

## 2024-05-17 MED ORDER — CEFAZOLIN SODIUM-DEXTROSE 2-4 GM/100ML-% IV SOLN
2.0000 g | Freq: Four times a day (QID) | INTRAVENOUS | Status: AC
  Administered 2024-05-17 (×2): 2 g via INTRAVENOUS
  Filled 2024-05-17 (×2): qty 100

## 2024-05-17 MED ORDER — CHLORHEXIDINE GLUCONATE 0.12 % MT SOLN
15.0000 mL | Freq: Once | OROMUCOSAL | Status: AC
  Administered 2024-05-17: 15 mL via OROMUCOSAL

## 2024-05-17 MED ORDER — TRANEXAMIC ACID-NACL 1000-0.7 MG/100ML-% IV SOLN
1000.0000 mg | INTRAVENOUS | Status: AC
  Administered 2024-05-17: 1000 mg via INTRAVENOUS
  Filled 2024-05-17: qty 100

## 2024-05-17 MED ORDER — METHOCARBAMOL 1000 MG/10ML IJ SOLN: 500.0000 mg | Freq: Four times a day (QID) | INTRAMUSCULAR | Status: DC | PRN

## 2024-05-17 MED ORDER — ALBUTEROL SULFATE (2.5 MG/3ML) 0.083% IN NEBU: 3.0000 mL | INHALATION_SOLUTION | Freq: Four times a day (QID) | RESPIRATORY_TRACT | Status: DC | PRN

## 2024-05-17 MED ORDER — PHENOL 1.4 % MT LIQD: 1.0000 | OROMUCOSAL | Status: DC | PRN

## 2024-05-17 MED ORDER — ONDANSETRON HCL 4 MG/2ML IJ SOLN
INTRAMUSCULAR | Status: DC | PRN
  Administered 2024-05-17: 4 mg via INTRAVENOUS

## 2024-05-17 MED ORDER — METHOCARBAMOL 500 MG PO TABS
500.0000 mg | ORAL_TABLET | Freq: Four times a day (QID) | ORAL | Status: DC | PRN
  Administered 2024-05-17 – 2024-05-20 (×6): 500 mg via ORAL
  Filled 2024-05-17 (×6): qty 1

## 2024-05-17 MED ORDER — PROPOFOL 1000 MG/100ML IV EMUL
INTRAVENOUS | Status: AC
  Filled 2024-05-17: qty 100

## 2024-05-17 MED ORDER — BISACODYL 5 MG PO TBEC: 5.0000 mg | DELAYED_RELEASE_TABLET | Freq: Every day | ORAL | Status: DC | PRN

## 2024-05-17 MED ORDER — LAMOTRIGINE 100 MG PO TABS
100.0000 mg | ORAL_TABLET | Freq: Every day | ORAL | Status: DC
  Administered 2024-05-17 – 2024-05-19 (×3): 100 mg via ORAL
  Filled 2024-05-17 (×3): qty 1

## 2024-05-17 MED ORDER — FENTANYL CITRATE (PF) 50 MCG/ML IJ SOSY
100.0000 ug | PREFILLED_SYRINGE | INTRAMUSCULAR | Status: DC
  Administered 2024-05-17: 50 ug via INTRAVENOUS
  Filled 2024-05-17: qty 2

## 2024-05-17 MED ORDER — OXYCODONE HCL 5 MG PO TABS
5.0000 mg | ORAL_TABLET | ORAL | Status: DC | PRN
  Administered 2024-05-17 (×2): 10 mg via ORAL
  Filled 2024-05-17 (×2): qty 2

## 2024-05-17 MED ORDER — GALCANEZUMAB-GNLM 120 MG/ML ~~LOC~~ SOAJ: 120.0000 mg | SUBCUTANEOUS | Status: DC

## 2024-05-17 MED ORDER — PROPOFOL 500 MG/50ML IV EMUL
INTRAVENOUS | Status: DC | PRN
  Administered 2024-05-17: 125 ug/kg/min via INTRAVENOUS

## 2024-05-17 MED ORDER — KCL IN DEXTROSE-NACL 20-5-0.9 MEQ/L-%-% IV SOLN
INTRAVENOUS | Status: AC
  Filled 2024-05-17: qty 1000

## 2024-05-17 MED ORDER — FAMOTIDINE 20 MG PO TABS: 20.0000 mg | ORAL_TABLET | Freq: Two times a day (BID) | ORAL | Status: DC | PRN

## 2024-05-17 SURGICAL SUPPLY — 55 items
ATTUNE PS FEM LT SZ 5 CEM KNEE (Femur) IMPLANT
ATTUNE PSRP INSR SZ5 5 KNEE (Insert) IMPLANT
BAG COUNTER SPONGE SURGICOUNT (BAG) ×1 IMPLANT
BAG ZIPLOCK 12X15 (MISCELLANEOUS) IMPLANT
BASE TIBIAL ROT PLAT SZ 5 KNEE (Knees) IMPLANT
BLADE SAW SGTL 11.0X1.19X90.0M (BLADE) ×1 IMPLANT
BLADE SAW SGTL 13.0X1.19X90.0M (BLADE) ×1 IMPLANT
BLADE SURG SZ10 CARB STEEL (BLADE) ×2 IMPLANT
BNDG ELASTIC 4INX 5YD STR LF (GAUZE/BANDAGES/DRESSINGS) ×1 IMPLANT
BNDG ELASTIC 6INX 5YD STR LF (GAUZE/BANDAGES/DRESSINGS) ×1 IMPLANT
BOWL SMART MIX CTS (DISPOSABLE) ×1 IMPLANT
CEMENT HV SMART SET (Cement) ×2 IMPLANT
COVER SURGICAL LIGHT HANDLE (MISCELLANEOUS) ×1 IMPLANT
CUFF TRNQT CYL 34X4.125X (TOURNIQUET CUFF) ×1 IMPLANT
DRAPE INCISE IOBAN 66X45 STRL (DRAPES) IMPLANT
DRAPE SHEET LG 3/4 BI-LAMINATE (DRAPES) ×1 IMPLANT
DRAPE SURG ORHT 6 SPLT 77X108 (DRAPES) ×2 IMPLANT
DRAPE TOP 10253 STERILE (DRAPES) ×1 IMPLANT
DRAPE U-SHAPE 47X51 STRL (DRAPES) ×1 IMPLANT
DRSG AQUACEL AG ADV 3.5X10 (GAUZE/BANDAGES/DRESSINGS) ×1 IMPLANT
DURAPREP 26ML APPLICATOR (WOUND CARE) ×1 IMPLANT
ELECT BLADE TIP CTD 4 INCH (ELECTRODE) ×1 IMPLANT
ELECT PENCIL ROCKER SW 15FT (MISCELLANEOUS) ×1 IMPLANT
ELECT REM PT RETURN 15FT ADLT (MISCELLANEOUS) ×1 IMPLANT
GLOVE BIOGEL PI IND STRL 7.0 (GLOVE) ×1 IMPLANT
GLOVE BIOGEL PI IND STRL 8 (GLOVE) ×1 IMPLANT
GLOVE SURG SS PI 7.0 STRL IVOR (GLOVE) ×1 IMPLANT
GLOVE SURG SS PI 8.0 STRL IVOR (GLOVE) ×2 IMPLANT
GOWN STRL REUS W/ TWL XL LVL3 (GOWN DISPOSABLE) ×2 IMPLANT
HEMOSTAT SPONGE AVITENE ULTRA (HEMOSTASIS) IMPLANT
HOLDER FOLEY CATH W/STRAP (MISCELLANEOUS) ×1 IMPLANT
IMMOBILIZER KNEE 20 THIGH 36 (SOFTGOODS) ×1 IMPLANT
KIT TURNOVER KIT A (KITS) ×1 IMPLANT
MANIFOLD NEPTUNE II (INSTRUMENTS) ×1 IMPLANT
NS IRRIG 1000ML POUR BTL (IV SOLUTION) IMPLANT
PACK TOTAL KNEE CUSTOM (KITS) ×1 IMPLANT
PATELLA MEDIAL ATTUN 35MM KNEE (Knees) IMPLANT
PIN STEINMAN FIXATION KNEE (PIN) IMPLANT
PROTECTOR NERVE ULNAR (MISCELLANEOUS) ×1 IMPLANT
SEALER BIPOLAR AQUA 6.0 (INSTRUMENTS) ×1 IMPLANT
SET HNDPC FAN SPRY TIP SCT (DISPOSABLE) ×1 IMPLANT
SOLUTION PRONTOSAN WOUND 350ML (IRRIGATION / IRRIGATOR) ×1 IMPLANT
SPIKE FLUID TRANSFER (MISCELLANEOUS) ×1 IMPLANT
STAPLER VISISTAT (STAPLE) IMPLANT
STRIP CLOSURE SKIN 1/2X4 (GAUZE/BANDAGES/DRESSINGS) IMPLANT
SUT BONE WAX W31G (SUTURE) IMPLANT
SUT MNCRL AB 4-0 PS2 18 (SUTURE) IMPLANT
SUT STRATAFIX PDS+ 0 24IN (SUTURE) ×1 IMPLANT
SUT VIC AB 1 CT1 27XBRD ANTBC (SUTURE) ×3 IMPLANT
SUT VIC AB 2-0 CT1 TAPERPNT 27 (SUTURE) ×3 IMPLANT
TRAY FOLEY MTR SLVR 16FR STAT (SET/KITS/TRAYS/PACK) ×1 IMPLANT
TUBE SUCTION HIGH CAP CLEAR NV (SUCTIONS) ×1 IMPLANT
WATER STERILE IRR 1000ML POUR (IV SOLUTION) ×2 IMPLANT
WIPE CHG 2% PREP (PERSONAL CARE ITEMS) ×1 IMPLANT
WRAP KNEE MAXI GEL POST OP (GAUZE/BANDAGES/DRESSINGS) ×1 IMPLANT

## 2024-05-17 NOTE — Op Note (Unsigned)
 NAME: Amanda [name], Amanda [name] MEDICAL RECORD NO: [id number] ACCOUNT NO: [id number] DATE OF BIRTH: 1955-06-11 FACILITY: [facility] LOCATION[id number] PHYSICIAN: Reyes KYM Billing, MD  Operative Report   DATE OF PROCEDURE: 05/17/2024  PREOPERATIVE DIAGNOSIS:  End-stage osteoarthrosis and varus deformity, left knee.  POSTOPERATIVE DIAGNOSIS:  End-stage osteoarthrosis and varus deformity, left knee.  PROCEDURE PERFORMED:  Left total knee arthroplasty utilizing an Attune rotating platform, 5 femur, 4 tibia, 5 mm insert, 35 patella.  ANESTHESIA:  Spinal.  ASSISTANT:  Jaclyn Bissell, PA.  HISTORY:  A 68 year old with end-stage osteoarthrosis and varus deformity of the left knee, indicated for replacement of the degenerated joint.  Risk and benefits discussed, including bleeding, infection, damage to neurovascular structures, no change in  symptoms, worsening symptoms, DVT and PE, and anesthetic complications, etc.  TECHNIQUE:  The patient was in the supine position.  After induction of adequate spinal anesthesia and 2 g of Kefzol , the left lower extremity was prepped, draped, and exsanguinated in the usual sterile fashion.  Thigh tourniquet inflated to 225 mmHg.  A  midline incision was made over the knee.  Full-thickness flaps were developed.  A medial parapatellar arthrotomy was performed.  The patella was gently everted.  The knee was flexed.  Tricompartmental arthrosis was noted, particularly on the medial  compartment, bone-on-bone.  Remnants of medial and lateral menisci were excised as well as the ACL.  A notch was placed above the femoral notch where a starting hole for the femoral drill was then drilled in line with the femur, entering the canal  without difficulty.  This was irrigated and confirmed with a T-handle.  Intramedullary guide, 5 degrees, 10 off the distal defect due to a flexion contracture.  Pinned, performed the distal femoral cut.  Sized the femur off the anterior cortex to a 5.   This  was pinned in 3 degrees of external rotation.  Block placed.  Anterior, posterior, and chamfer cuts were performed without notching.  Subluxed the tibia low side medially.  An external alignment guide was used, 3 off the defect medially, parallel to  the shaft, 3 degrees slope, bisecting the tibiotalar joint.  Pinned, performed our tibial cut with soft tissue protected posteriorly at all times.  I checked the extension block, and it was satisfactory at 5 insert.  The knee was flexed.  I sized the  tibia to a 4, maximizing the coverage just the medial third of tibial tubercle.  Pinned, harvested bone essentially and impacted into the distal femur.  Drilled essentially, punch guide.  Attention back to the femur for the box cut, bisecting the canal.   Pinned it, performed a box cut.  Placed a trial femur, which sit flush.  Drilled lug holes.  Placed the 5 insert, reduced, the knee had full extension, full flexion, good stability with varus and valgus stressing 0 to 30 degrees, and negative anterior  drawer.  Everted the patella, measured at 21, planed it to a 14, utilizing the patella jig.  Measured at a 35 with a trial paddle parallel to the joint surface.  Drilled the peg holes, medializing them.  Placed a trial of patella, reduced it, had a  slight lateral tracking of the patella.  Performed a lateral release and had excellent tracking of the patella.  All instrumentation was removed.  We checked posteriorly.  Popliteus and the capsule was intact.  I injected Exparel  through the posterior  medial capsule, aspirating first without breaking the vacuum, injecting 20 mL.  Anesthetized medial and  lateral gutters, periosteum of the femur, proximal tibia, quadriceps, etc.  Pulsatile lavage and thoroughly cleaned all bony surfaces.  The knee was  flexed.  All surfaces were thoroughly dried.  Cement was mixed on the back table under centrifuge and vacuum.  Drill holes in the proximal tibia.  I then placed cement in  the proximal tibia, digitally pressurizing it.  Cement on the tibial component and  impacted into place.  Cement on the femoral component in the femur and impacted into place.  Redundant cement was removed.  Placed a 5 trial insert, reduced it, and held it in full extension with an axial load throughout the curing of the cement.   Cemented and clamped the patella.  Marcaine  with epinephrine  followed by Prontosan was placed in the wound, and it was covered during the curing of the cement.  The tourniquet was deflated at 60 minutes, and any bleeding was cauterized with an  Aquamantys.  Next, the insert was then removed, and meticulously removed all redundant cement.  Copiously irrigated with pulsatile lavage followed by Prontosan.  The 5 permanent polyethylene insert was placed, reduced, and again had full extension, full  flexion, good stability with varus and valgus stressing 0 to 30 degrees, and negative anterior drawer.  In mid flexion, reapproximated the patellar arthrotomy with 1 Vicryl interrupted figure-of-eight sutures followed by a running Stratafix.  Excellent  patellofemoral tracking following this and full range.  Copiously irrigated subcutaneous tissue with Prontosan, subcu with 2-0 and skin with staples.  The wound was dressed sterilely, placed in immobilizer, and transported to the recovery room in  satisfactory condition.  The patient tolerated the procedure well.  No complications.  Assistant, Jaclyn Bissell, [place], was used throughout the case for patient positioning, exposure, and closure.  BLOOD LOSS:  50 mL.   NIK D: 05/17/2024 2:12:14 pm T: 05/17/2024 9:28:00 pm  JOB: [id number]/ [id number]

## 2024-05-17 NOTE — Anesthesia Procedure Notes (Signed)
 Spinal  Patient location during procedure: OR Start time: 05/17/2024 12:04 PM End time: 05/17/2024 12:09 PM Reason for block: surgical anesthesia Staffing Performed: anesthesiologist  Anesthesiologist: Epifanio Charleston, MD Performed by: Epifanio Charleston, MD Authorized by: Epifanio Charleston, MD   Preanesthetic Checklist Completed: patient identified, IV checked, site marked, risks and benefits discussed, surgical consent, monitors and equipment checked, pre-op evaluation and timeout performed Spinal Block Patient position: sitting Prep: DuraPrep Patient monitoring: heart rate, cardiac monitor, continuous pulse ox and blood pressure Approach: midline Location: L4-5 Injection technique: single-shot Needle Needle type: Pencan  Needle gauge: 24 G Needle length: 9 cm Assessment Sensory level: T4 Events: CSF return

## 2024-05-17 NOTE — Transfer of Care (Signed)
 Immediate Anesthesia Transfer of Care Note  Patient: Amanda [name]  Procedure(s) Performed: ARTHROPLASTY, KNEE, TOTAL (Left: Knee)  Patient Location: PACU  Anesthesia Type:MAC  Level of Consciousness: awake and alert   Airway & Oxygen Therapy: Patient Spontanous Breathing and Patient connected to nasal cannula oxygen  Post-op Assessment: Report given to RN and Post -op Vital signs reviewed and stable  Post vital signs: Reviewed and stable  Last Vitals:  Vitals Value Taken Time  BP 123/56 05/17/24 14:16  Temp 36.2 C 05/17/24 14:16  Pulse 48 05/17/24 14:25  Resp 12 05/17/24 14:25  SpO2 98 % 05/17/24 14:25  Vitals shown include unfiled device data.  Last Pain:  Vitals:   05/17/24 1416  TempSrc:   PainSc: 0-No pain         Complications: No notable events documented.

## 2024-05-17 NOTE — Discharge Instructions (Signed)

## 2024-05-17 NOTE — H&P (Signed)
 Amanda [name] is an 68 y.o. female.   Chief Complaint: left knee pain HPI: The patient is going to have a left total knee arthroplasty performed by Dr. Reyes Billing on 05/17/2024 at [facility]. Please see electronic hospital medical record for complete details.   Dr. Billing and the patient mutually agreed to proceed with a total knee replacement. Risks and benefits of the procedure were discussed including stiffness, suboptimal range of motion, persistent pain, infection requiring removal of prosthesis and reinsertion, need for prophylactic antibiotics in the future, for example, dental procedures, possible need for manipulation, revision in the future and also anesthetic complications including DVT, PE, etc. We discussed the perioperative course, time in the hospital, postoperative recovery and the need for elevation to control swelling. We also discussed the predicted range of motion and the probability that squatting and kneeling would be unobtainable in the future. In addition, postoperative anticoagulation was discussed. We have obtained preoperative medical clearance as necessary. Provided illustrated handout and discussed it in detail. They will enroll in the total joint replacement educational forum at the hospital. She has been cleared. She has a walker, cane, shower seat, script for commode seat. Her husband has back issues and cannot do as much physical labor at home.  Past Medical History:  Diagnosis Date   Acute urinary retention 03/30/2024   Arthritis    R foot and L knee   Atrial fib/flutter, transient (HCC)    Atrial fibrillation (HCC)    COPD (chronic obstructive pulmonary disease) (HCC)    DEGENERATIVE JOINT DISEASE 11/12/2006   DISORDER, BIPOLAR NOS 02/28/2007   Easy bruising 01/17/2023   GERD 11/12/2006   Headache    migraines   Major depressive disorder, single episode, unspecified 07/15/2021    Past Surgical History:  Procedure Laterality Date    ABDOMINAL HYSTERECTOMY     ATRIAL FIBRILLATION ABLATION N/A 07/26/2023   Procedure: ATRIAL FIBRILLATION ABLATION;  Surgeon: Nancey Eulas BRAVO, MD;  [facility];  Service: Cardiovascular;  Laterality: N/A;   BREAST SURGERY     reduction   KNEE ARTHROSCOPY Right    TONSILLECTOMY      Family History  Problem Relation Age of Onset   Dementia Mother    Congestive Heart Failure Father    Migraines Father    Dementia Maternal Aunt    Dementia Maternal Aunt    Dementia Maternal Grandmother    Social History:  reports that she quit smoking about 11 months ago. Her smoking use included cigarettes. She has a 90 pack-year smoking history. She has never used smokeless tobacco. She reports current drug use. Frequency: 7.00 times per week. Drug: Marijuana. She reports that she does not drink alcohol.  Allergies:  Allergies  Allergen Reactions   Augmentin [Amoxicillin-Pot Clavulanate] Other (See Comments)    Has patient had a PCN reaction causing immediate rash, facial/tongue/throat swelling, SOB or lightheadedness with hypotension: No Has patient had a PCN reaction causing severe rash involving mucus membranes or skin necrosis: No Has patient had a PCN reaction that required hospitalization: No Has patient had a PCN reaction occurring within the last 10 years: No If all of the above answers are NO, then may proceed with Cephalosporin use.    Imitrex [Sumatriptan] Other (See Comments)    Gi upset   Doxycycline Hyclate Hives and Rash    (Not in a hospital admission)   Results for orders placed or performed in visit on 05/16/24 (from the past 48 hours)  POCT URINALYSIS DIP (CLINITEK)     Status: None   Collection Time: 05/16/24  4:21 PM  Result Value Ref Range   Color, UA yellow yellow   Clarity, UA clear clear   Glucose, UA negative negative mg/dL   Bilirubin, UA negative negative   Ketones, POC UA negative negative mg/dL   Spec Grav, UA 8.979 8.989 - 1.025   Blood,  UA negative negative   pH, UA 6.0 5.0 - 8.0   POC PROTEIN,UA negative negative, trace   Urobilinogen, UA 0.2 0.2 or 1.0 E.U./dL   Nitrite, UA Negative Negative   Leukocytes, UA Negative Negative   No results found.  Review of Systems  Constitutional: Negative.   HENT: Negative.    Eyes: Negative.   Respiratory: Negative.    Cardiovascular: Negative.   Gastrointestinal: Negative.   Endocrine: Negative.   Genitourinary: Negative.   Musculoskeletal:  Positive for arthralgias, gait problem, joint swelling and myalgias.  Skin: Negative.   Psychiatric/Behavioral: Negative.      There were no vitals taken for this visit. Physical Exam Constitutional:      Appearance: Normal appearance.  HENT:     Head: Normocephalic and atraumatic.     Right Ear: External ear normal.     Left Ear: External ear normal.     Nose: Nose normal.     Mouth/Throat:     Pharynx: Oropharynx is clear.  Eyes:     Pupils: Pupils are equal, round, and reactive to light.  Cardiovascular:     Rate and Rhythm: Normal rate.  Pulmonary:     Effort: Pulmonary effort is normal.  Abdominal:     General: Abdomen is flat.  Musculoskeletal:     Cervical back: Normal range of motion.     Comments: Walks an antalgic gait. Left knee trace effusion tender medial joint line patellofemoral pain compression range 0-1 30. Ipsilateral hip and ankle exams unremarkable.  Skin:    General: Skin is warm and dry.  Neurological:     Mental Status: She is alert.    Three-view x-rays of the knee demonstrate end-stage osteoarthrosis medial compartment varus deformity left knee  Assessment/Plan  Impression: End-stage left knee DJD  Plan: Pt with end-stage left knee DJD, bone-on-bone, refractory to conservative tx, scheduled for left total knee replacement by Dr. Duwayne on 05/17/24. We again discussed the procedure itself as well as risks, complications and alternatives, including but not limited to DVT, PE, infx, bleeding,  failure of procedure, need for secondary procedure including manipulation, nerve injury, ongoing pain/symptoms, anesthesia risk, even stroke or death. Also discussed typical post-op protocols, activity restrictions, need for PT, flexion/extension exercises, time out of work. Discussed need for DVT ppx post-op per protocol. Discussed dental ppx and infx prevention. Also discussed limitations post-operatively such as kneeling and squatting. All questions were answered. Patient desires to proceed with surgery as scheduled.  Will hold supplements, ASA and NSAIDs accordingly. Will remain NPO after midnight the night before surgery. Will present to [facility] for pre-op testing. Anticipate hospital stay to include at least 2 midnights given medical history and to ensure proper pain control. Plan ASA for DVT ppx post-op. Plan Oxy, Robaxin , Colace, Miralax . Plan home with HHPT post-op with family members at home for assistance. Will follow up 10-14 days post-op for staple removal and xrays.  Plan left total knee replacement   Darice CHRISTELLA Randy, PA-C for Dr Duwayne 05/17/2024, 8:18 AM

## 2024-05-17 NOTE — Plan of Care (Signed)
  Problem: Education: Goal: Knowledge of General Education information will improve Description: Including pain rating scale, medication(s)/side effects and non-pharmacologic comfort measures Outcome: Progressing   Problem: Health Behavior/Discharge Planning: Goal: Ability to manage health-related needs will improve Outcome: Progressing   Problem: Clinical Measurements: Goal: Ability to maintain clinical measurements within normal limits will improve Outcome: Progressing Goal: Will remain free from infection Outcome: Progressing Goal: Diagnostic test results will improve Outcome: Progressing Goal: Respiratory complications will improve Outcome: Progressing Goal: Cardiovascular complication will be avoided Outcome: Progressing   Problem: Activity: Goal: Risk for activity intolerance will decrease Outcome: Progressing   Problem: Nutrition: Goal: Adequate nutrition will be maintained Outcome: Completed/Met   Problem: Coping: Goal: Level of anxiety will decrease Outcome: Progressing   Problem: Elimination: Goal: Will not experience complications related to bowel motility Outcome: Progressing Goal: Will not experience complications related to urinary retention Outcome: Progressing   Problem: Pain Managment: Goal: General experience of comfort will improve and/or be controlled Outcome: Progressing   Problem: Safety: Goal: Ability to remain free from injury will improve Outcome: Progressing   Problem: Skin Integrity: Goal: Risk for impaired skin integrity will decrease Outcome: Progressing   Problem: Education: Goal: Knowledge of the prescribed therapeutic regimen will improve Outcome: Progressing Goal: Individualized Educational Video(s) Outcome: Completed/Met   Problem: Activity: Goal: Ability to avoid complications of mobility impairment will improve Outcome: Progressing Goal: Range of joint motion will improve Outcome: Progressing   Problem: Clinical  Measurements: Goal: Postoperative complications will be avoided or minimized Outcome: Progressing   Problem: Pain Management: Goal: Pain level will decrease with appropriate interventions Outcome: Progressing   Problem: Skin Integrity: Goal: Will show signs of wound healing Outcome: Progressing

## 2024-05-17 NOTE — Addendum Note (Signed)
 Addended by: Anaja Monts, CAMIE E on: 05/17/2024 03:17 PM   Modules accepted: Orders

## 2024-05-17 NOTE — Telephone Encounter (Signed)
 Please call Amanda [name] to let her know that her urine does not show any signs of infection, which is very reassuring.   If she is taking furosemide  (lasix ), I would like her to hold this unless she is having significant swelling. This will increase urine output and could make the symptoms worse.   We can consider medication for overactive bladder, but this is not something that can be started right before surgery. She can ask the surgeon if they are comfortable with her having something for overactive bladder in the immediate post-op period to help with the symptoms, but this will have to be cleared by them first.

## 2024-05-17 NOTE — Anesthesia Procedure Notes (Signed)
 Anesthesia Regional Block: Adductor canal block   Pre-Anesthetic Checklist: , timeout performed,  Correct Patient, Correct Site, Correct Laterality,  Correct Procedure, Correct Position, site marked,  Risks and benefits discussed,  Surgical consent,  Pre-op evaluation,  At surgeon's request and post-op pain management  Laterality: Left  Prep: chloraprep       Needles:  Injection technique: Single-shot  Needle Type: Echogenic Needle     Needle Length: 9cm  Needle Gauge: 21     Additional Needles:   Procedures:,,,, ultrasound used (permanent image in chart),,    Narrative:  Start time: 05/17/2024 11:25 AM End time: 05/17/2024 11:31 AM Injection made incrementally with aspirations every 5 mL.  Performed by: Personally  Anesthesiologist: Epifanio Charleston, MD

## 2024-05-17 NOTE — Anesthesia Postprocedure Evaluation (Signed)
 Anesthesia Post Note  Patient: BONNIEJEAN PIANO  Procedure(s) Performed: ARTHROPLASTY, KNEE, TOTAL (Left: Knee)     Patient location during evaluation: PACU Anesthesia Type: Spinal and MAC Level of consciousness: awake and alert Pain management: pain level controlled Vital Signs Assessment: post-procedure vital signs reviewed and stable Respiratory status: spontaneous breathing and respiratory function stable Cardiovascular status: blood pressure returned to baseline and stable Postop Assessment: spinal receding Anesthetic complications: no   No notable events documented.  Last Vitals:  Vitals:   05/17/24 1531 05/17/24 1750  BP: 133/61 (!) 142/69  Pulse: (!) 54   Resp: 16 16  Temp: 36.6 C 36.7 C  SpO2: 96% 95%    Last Pain:  Vitals:   05/17/24 1750  TempSrc: Oral  PainSc:                  Epifanio Lamar BRAVO

## 2024-05-17 NOTE — Brief Op Note (Signed)
 05/17/2024  11:23 AM  PATIENT:  Amanda [name]  68 y.o. female  PRE-OPERATIVE DIAGNOSIS:  degenerative joint disease  POST-OPERATIVE DIAGNOSIS:  * No post-op diagnosis entered *  PROCEDURE:  Procedure(s): ARTHROPLASTY, KNEE, TOTAL (Left) The aquamantis was utilized for this case to help facilitate better hemostasis as patient was felt to be at increased risk of bleeding because of history of coagulopathy.   SURGEON:  Surgeons and Role:    * Duwayne Purchase, MD - Primary  PHYSICIAN ASSISTANT:   ASSISTANTS: Bissell   ANESTHESIA:   spinal  EBL:  50   BLOOD ADMINISTERED:none  DRAINS: none   LOCAL MEDICATIONS USED:  MARCAINE      SPECIMEN:  No Specimen  DISPOSITION OF SPECIMEN:  N/A  COUNTS:  YES  TOURNIQUET:    DICTATION: .Other Dictation: Dictation Number [id number]  PLAN OF CARE: Admit for overnight observation  PATIENT DISPOSITION:  PACU - hemodynamically stable.   Delay start of Pharmacological VTE agent (>24hrs) due to surgical blood loss or risk of bleeding: no

## 2024-05-17 NOTE — Interval H&P Note (Signed)
 History and Physical Interval Note:  05/17/2024 11:23 AM  Amanda [name]  has presented today for surgery, with the diagnosis of degenerative joint disease.  The various methods of treatment have been discussed with the patient and family. After consideration of risks, benefits and other options for treatment, the patient has consented to  Procedure(s): ARTHROPLASTY, KNEE, TOTAL (Left) as a surgical intervention.  The patient's history has been reviewed, patient examined, no change in status, stable for surgery.  I have reviewed the patient's chart and labs.  Questions were answered to the patient's satisfaction.     Reyes JAYSON Billing

## 2024-05-18 ENCOUNTER — Other Ambulatory Visit (HOSPITAL_COMMUNITY): Payer: Self-pay

## 2024-05-18 ENCOUNTER — Encounter (HOSPITAL_COMMUNITY): Payer: Self-pay | Admitting: Specialist

## 2024-05-18 DIAGNOSIS — M1712 Unilateral primary osteoarthritis, left knee: Secondary | ICD-10-CM | POA: Diagnosis not present

## 2024-05-18 LAB — BASIC METABOLIC PANEL WITH GFR
Anion gap: 10 (ref 5–15)
BUN: 13 mg/dL (ref 8–23)
CO2: 25 mmol/L (ref 22–32)
Calcium: 9.1 mg/dL (ref 8.9–10.3)
Chloride: 104 mmol/L (ref 98–111)
Creatinine, Ser: 0.72 mg/dL (ref 0.44–1.00)
GFR, Estimated: >60 mL/min (ref >60)
Glucose, Bld: 127 mg/dL — ABNORMAL HIGH (ref 70–99)
Potassium: 4.5 mmol/L (ref 3.5–5.1)
Sodium: 139 mmol/L (ref 135–145)

## 2024-05-18 LAB — CBC
HCT: 35.3 % — ABNORMAL LOW (ref 36.0–46.0)
Hemoglobin: 10.9 g/dL — ABNORMAL LOW (ref 12.0–15.0)
MCH: 26.7 pg (ref 26.0–34.0)
MCHC: 30.9 g/dL (ref 30.0–36.0)
MCV: 86.5 fL (ref 80.0–100.0)
Platelets: 242 K/uL (ref 150–400)
RBC: 4.08 MIL/uL (ref 3.87–5.11)
RDW: 13.6 % (ref 11.5–15.5)
WBC: 15.8 K/uL — ABNORMAL HIGH (ref 4.0–10.5)
nRBC: 0 % (ref 0.0–0.2)

## 2024-05-18 MED ORDER — HYDROMORPHONE HCL 2 MG PO TABS
2.0000 mg | ORAL_TABLET | ORAL | Status: DC | PRN
  Administered 2024-05-18: 4 mg via ORAL
  Filled 2024-05-18 (×2): qty 2

## 2024-05-18 NOTE — Progress Notes (Signed)
 Subjective: 1 Day Post-Op Procedures (LRB): ARTHROPLASTY, KNEE, TOTAL (Left) Patient reports pain as 3 on 0-10 scale and 4 on 0-10 scale.   Denies CP or SOB.  Voiding without difficulty. Positive flatus. Objective: Vital signs in last 24 hours: Temp:  [97.1 F (36.2 C)-98.1 F (36.7 C)] 97.3 F (36.3 C) (12/11 0509) Pulse Rate:  [50-66] 63 (12/11 0509) Resp:  [12-18] 18 (12/11 0509) BP: (123-166)/(55-112) 144/75 (12/11 0509) SpO2:  [92 %-98 %] 97 % (12/11 0509) Weight:  [77.1 kg] 77.1 kg (12/10 0957)  Intake/Output from previous day: 12/10 0701 - 12/11 0700 In: 4173.5 [P.O.:1080; I.V.:2593.5; IV Piggyback:500.1] Out: 2400 [Urine:2350; Blood:50] Intake/Output this shift: No intake/output data recorded.  Recent Labs    05/18/24 0317  HGB 10.9*   Recent Labs    05/18/24 0317  WBC 15.8*  RBC 4.08  HCT 35.3*  PLT 242   Recent Labs    05/18/24 0317  NA 139  K 4.5  CL 104  CO2 25  BUN 13  CREATININE 0.72  GLUCOSE 127*  CALCIUM 9.1   No results for input(s): LABPT, INR in the last 72 hours.  Neurologically intact ABD soft Sensation intact distally Intact pulses distally Dorsiflexion/Plantar flexion intact Incision: dressing C/D/I Compartment soft No DVT  Assessment/Plan:  1 Day Post-Op Procedures (LRB): ARTHROPLASTY, KNEE, TOTAL (Left) Advance diet Up with therapy Discharge home with home health Anticipate DC tomorrow  Principal Problem:   Left knee DJD      Reyes JAYSON Billing 05/18/2024, @NOW 

## 2024-05-18 NOTE — Plan of Care (Signed)
°  Problem: Health Behavior/Discharge Planning: Goal: Ability to manage health-related needs will improve Outcome: Progressing   Problem: Clinical Measurements: Goal: Ability to maintain clinical measurements within normal limits will improve Outcome: Progressing Goal: Will remain free from infection Outcome: Progressing Goal: Diagnostic test results will improve Outcome: Progressing Goal: Respiratory complications will improve Outcome: Adequate for Discharge Goal: Cardiovascular complication will be avoided Outcome: Adequate for Discharge   Problem: Activity: Goal: Risk for activity intolerance will decrease Outcome: Adequate for Discharge   Problem: Coping: Goal: Level of anxiety will decrease Outcome: Progressing   Problem: Elimination: Goal: Will not experience complications related to bowel motility Outcome: Progressing   Problem: Pain Managment: Goal: General experience of comfort will improve and/or be controlled Outcome: Progressing   Problem: Safety: Goal: Ability to remain free from injury will improve Outcome: Progressing   Problem: Skin Integrity: Goal: Risk for impaired skin integrity will decrease Outcome: Adequate for Discharge   Problem: Education: Goal: Knowledge of the prescribed therapeutic regimen will improve Outcome: Progressing   Problem: Activity: Goal: Ability to avoid complications of mobility impairment will improve Outcome: Progressing Goal: Range of joint motion will improve Outcome: Adequate for Discharge   Problem: Clinical Measurements: Goal: Postoperative complications will be avoided or minimized Outcome: Progressing   Problem: Pain Management: Goal: Pain level will decrease with appropriate interventions Outcome: Progressing   Problem: Skin Integrity: Goal: Will show signs of wound healing Outcome: Progressing

## 2024-05-18 NOTE — Evaluation (Signed)
 Physical Therapy Evaluation Patient Details Name: Amanda [name] MRN: [id number] DOB: 05/26/1956 Today's Date: 05/18/2024  History of Present Illness  Pt s/p L TKR and with hx of bipolar and afib  Clinical Impression  Pt s/p L TKR and presents with decreased L LE strength/ROM and post op pain limiting functional mobility.  Pt should progress to dc home with family assist and reports HHPT follow up is planned.        If plan is discharge home, recommend the following: A little help with walking and/or transfers;A lot of help with bathing/dressing/bathroom;Help with stairs or ramp for entrance;Assistance with cooking/housework;Assist for transportation   Can travel by private vehicle        Equipment Recommendations Rolling walker (2 wheels)  Recommendations for Other Services       Functional Status Assessment Patient has had a recent decline in their functional status and demonstrates the ability to make significant improvements in function in a reasonable and predictable amount of time.     Precautions / Restrictions Precautions Precautions: Knee;Fall Required Braces or Orthoses: Knee Immobilizer - Left Knee Immobilizer - Left: Discontinue once straight leg raise with < 10 degree lag Restrictions Weight Bearing Restrictions Per Provider Order: No LLE Weight Bearing Per Provider Order: Weight bearing as tolerated      Mobility  Bed Mobility Overal bed mobility: Needs Assistance Bed Mobility: Sit to Supine       Sit to supine: Supervision   General bed mobility comments: for safety only    Transfers Overall transfer level: Needs assistance Equipment used: Rolling walker (2 wheels) Transfers: Sit to/from Stand, Bed to chair/wheelchair/BSC Sit to Stand: Min assist, Contact guard assist   Step pivot transfers: Min assist, Contact guard assist       General transfer comment: cues for LE management and use of UEs to self assist; step pvt with RW recliner to  [facility]    Ambulation/Gait Ambulation/Gait assistance: Min assist, Contact guard assist Gait Distance (Feet): 74 Feet Assistive device: Rolling walker (2 wheels) Gait Pattern/deviations: Step-to pattern, Decreased step length - right, Decreased step length - left, Shuffle, Trunk flexed Gait velocity: decr     General Gait Details: cues for sequence, posture and position from [employer]            Wheelchair Mobility     Tilt Bed    Modified Rankin (Stroke Patients Only)       Balance Overall balance assessment: Needs assistance Sitting-balance support: No upper extremity supported, Feet supported Sitting balance-Leahy Scale: Good     Standing balance support: Bilateral upper extremity supported Standing balance-Leahy Scale: Poor                               Pertinent Vitals/Pain Pain Assessment Pain Assessment: 0-10 Pain Score: 7  Pain Location: L knee Pain Descriptors / Indicators: Aching, Sore Pain Intervention(s): Limited activity within patient's tolerance, Monitored during session, Premedicated before session, Ice applied    Home Living Family/patient expects to be discharged to:: Private residence Living Arrangements: Spouse/significant other Available Help at Discharge: Family;Available 24 hours/day (starting tomorrow) Type of Home: Mobile home Home Access: Stairs to enter Entrance Stairs-Rails: Right Entrance Stairs-Number of Steps: 3   Home Layout: One level Home Equipment: Crutches      Prior Function Prior Level of Function : Independent/Modified Independent  Extremity/Trunk Assessment   Upper Extremity Assessment Upper Extremity Assessment: Overall WFL for tasks assessed    Lower Extremity Assessment Lower Extremity Assessment: LLE deficits/detail LLE Deficits / Details: 3/5 quads with IND SLR and AAROM at knee -5 - 60    Cervical / Trunk Assessment Cervical / Trunk Assessment: Normal   Communication   Communication Communication: No apparent difficulties    Cognition Arousal: Alert Behavior During Therapy: WFL for tasks assessed/performed, Impulsive   PT - Cognitive impairments: No apparent impairments                         Following commands: Intact       Cueing Cueing Techniques: Verbal cues     General Comments      Exercises Total Joint Exercises Ankle Circles/Pumps: AROM, Both, 15 reps, Supine Quad Sets: AROM, Both, 10 reps, Supine Heel Slides: AAROM, Left, 15 reps, Supine Straight Leg Raises: AAROM, AROM, Left, 10 reps, Supine   Assessment/Plan    PT Assessment Patient needs continued PT services  PT Problem List Decreased strength;Decreased range of motion;Decreased activity tolerance;Decreased balance;Decreased mobility;Decreased knowledge of use of DME;Pain       PT Treatment Interventions DME instruction;Gait training;Stair training;Functional mobility training;Therapeutic activities;Therapeutic exercise;Patient/family education;Balance training    PT Goals (Current goals can be found in the Care Plan section)  Acute Rehab PT Goals Patient Stated Goal: Regain IND PT Goal Formulation: With patient Time For Goal Achievement: 05/25/24 Potential to Achieve Goals: Good    Frequency 7X/week     Co-evaluation               AM-PAC PT 6 Clicks Mobility  Outcome Measure Help needed turning from your back to your side while in a flat bed without using bedrails?: A Little Help needed moving from lying on your back to sitting on the side of a flat bed without using bedrails?: A Little Help needed moving to and from a bed to a chair (including a wheelchair)?: A Little Help needed standing up from a chair using your arms (e.g., wheelchair or bedside chair)?: A Little Help needed to walk in hospital room?: A Little Help needed climbing 3-5 steps with a railing? : A Little 6 Click Score: 18    End of Session Equipment  Utilized During Treatment: Gait belt Activity Tolerance: Patient tolerated treatment well Patient left: with call bell/phone within reach;in bed;with bed alarm set Nurse Communication: Mobility status PT Visit Diagnosis: Difficulty in walking, not elsewhere classified (R26.2)    Time: 0930-1005 PT Time Calculation (min) (ACUTE ONLY): 35 min   Charges:   PT Evaluation $PT Eval Low Complexity: 1 Low PT Treatments $Therapeutic Exercise: 8-22 mins PT General Charges $$ ACUTE PT VISIT: 1 Visit         [facility] PT Acute Rehabilitation Services Office [phone]   Kwabena Strutz 05/18/2024, 1:24 PM

## 2024-05-18 NOTE — Progress Notes (Signed)
 Physical Therapy Treatment Patient Details Name: Amanda [name] MRN: [id number] DOB: 22-Aug-1955 Today's Date: 05/18/2024   History of Present Illness Pt s/p L TKR and with hx of bipolar and afib    PT Comments  Pt continues very motivated and progressing well with mobility but requiring cueing for safety awareness.  Pt up to ambulate increased distance in hall and up to bathroom for toileting with hand hygiene/brushing teeth standing at sink.  Pt looking forward to return home tomorrow.    If plan is discharge home, recommend the following: A little help with walking and/or transfers;A lot of help with bathing/dressing/bathroom;Help with stairs or ramp for entrance;Assistance with cooking/housework;Assist for transportation   Can travel by private vehicle        Equipment Recommendations  Rolling walker (2 wheels)    Recommendations for Other Services       Precautions / Restrictions Precautions Precautions: Knee;Fall Required Braces or Orthoses: Knee Immobilizer - Left Knee Immobilizer - Left: Discontinue once straight leg raise with < 10 degree lag (pt performed IND SLR this date) Restrictions Weight Bearing Restrictions Per Provider Order: No LLE Weight Bearing Per Provider Order: Weight bearing as tolerated     Mobility  Bed Mobility Overal bed mobility: Needs Assistance Bed Mobility: Supine to Sit     Supine to sit: Supervision Sit to supine: Supervision   General bed mobility comments: for safety only    Transfers Overall transfer level: Needs assistance Equipment used: Rolling walker (2 wheels) Transfers: Sit to/from Stand Sit to Stand: Contact guard assist   Step pivot transfers: Min assist, Contact guard assist       General transfer comment: cues for LE management and use of UEs to self assist    Ambulation/Gait Ambulation/Gait assistance: Contact guard assist Gait Distance (Feet): 136 Feet (and 15' twice to/from bathroom) Assistive device:  Rolling walker (2 wheels) Gait Pattern/deviations: Step-to pattern, Decreased step length - right, Decreased step length - left, Shuffle, Trunk flexed Gait velocity: decr     General Gait Details: cues for sequence, posture and position from [employer]             Wheelchair Mobility     Tilt Bed    Modified Rankin (Stroke Patients Only)       Balance Overall balance assessment: Needs assistance Sitting-balance support: No upper extremity supported, Feet supported Sitting balance-Leahy Scale: Good     Standing balance support: No upper extremity supported Standing balance-Leahy Scale: Fair                              Hotel Manager: No apparent difficulties  Cognition Arousal: Alert Behavior During Therapy: WFL for tasks assessed/performed, Impulsive   PT - Cognitive impairments: No apparent impairments                         Following commands: Intact      Cueing Cueing Techniques: Verbal cues  Exercises Total Joint Exercises Ankle Circles/Pumps: AROM, Both, 15 reps, Supine Quad Sets: AROM, Both, 10 reps, Supine Heel Slides: AAROM, Left, 15 reps, Supine Straight Leg Raises: AAROM, AROM, Left, Supine, 5 reps    General Comments        Pertinent Vitals/Pain Pain Assessment Pain Assessment: 0-10 Pain Score: 7  Pain Location: L knee Pain Descriptors / Indicators: Aching, Sore Pain Intervention(s): Limited activity within patient's tolerance, Monitored during session, Premedicated  before session, Ice applied    Home Living Family/patient expects to be discharged to:: Private residence Living Arrangements: Spouse/significant other Available Help at Discharge: Family;Available 24 hours/day (starting tomorrow) Type of Home: Mobile home Home Access: Stairs to enter Entrance Stairs-Rails: Right Entrance Stairs-Number of Steps: 3   Home Layout: One level Home Equipment: Crutches      Prior  Function            PT Goals (current goals can now be found in the care plan section) Acute Rehab PT Goals Patient Stated Goal: Regain IND PT Goal Formulation: With patient Time For Goal Achievement: 05/25/24 Potential to Achieve Goals: Good Progress towards PT goals: Progressing toward goals    Frequency    7X/week      PT Plan      Co-evaluation              AM-PAC PT 6 Clicks Mobility   Outcome Measure  Help needed turning from your back to your side while in a flat bed without using bedrails?: A Little Help needed moving from lying on your back to sitting on the side of a flat bed without using bedrails?: A Little Help needed moving to and from a bed to a chair (including a wheelchair)?: A Little Help needed standing up from a chair using your arms (e.g., wheelchair or bedside chair)?: A Little Help needed to walk in hospital room?: A Little Help needed climbing 3-5 steps with a railing? : A Little 6 Click Score: 18    End of Session Equipment Utilized During Treatment: Gait belt Activity Tolerance: Patient tolerated treatment well Patient left: with call bell/phone within reach;in bed;with bed alarm set Nurse Communication: Mobility status PT Visit Diagnosis: Difficulty in walking, not elsewhere classified (R26.2)     Time: 8591-8561 PT Time Calculation (min) (ACUTE ONLY): 30 min  Charges:    $Gait Training: 8-22 mins $Therapeutic Exercise: 8-22 mins $Therapeutic Activity: 8-22 mins PT General Charges $$ ACUTE PT VISIT: 1 Visit                     [facility] PT Acute Rehabilitation Services Office [phone]    Kelson Queenan 05/18/2024, 3:53 PM

## 2024-05-19 DIAGNOSIS — R030 Elevated blood-pressure reading, without diagnosis of hypertension: Secondary | ICD-10-CM | POA: Diagnosis not present

## 2024-05-19 DIAGNOSIS — M1712 Unilateral primary osteoarthritis, left knee: Secondary | ICD-10-CM | POA: Diagnosis not present

## 2024-05-19 LAB — CBC
HCT: 34.4 % — ABNORMAL LOW (ref 36.0–46.0)
Hemoglobin: 10.8 g/dL — ABNORMAL LOW (ref 12.0–15.0)
MCH: 26.7 pg (ref 26.0–34.0)
MCHC: 31.4 g/dL (ref 30.0–36.0)
MCV: 84.9 fL (ref 80.0–100.0)
Platelets: 237 K/uL (ref 150–400)
RBC: 4.05 MIL/uL (ref 3.87–5.11)
RDW: 13.9 % (ref 11.5–15.5)
WBC: 12.7 K/uL — ABNORMAL HIGH (ref 4.0–10.5)
nRBC: 0 % (ref 0.0–0.2)

## 2024-05-19 LAB — BASIC METABOLIC PANEL WITH GFR
Anion gap: 10 (ref 5–15)
BUN: 12 mg/dL (ref 8–23)
CO2: 27 mmol/L (ref 22–32)
Calcium: 8.8 mg/dL — ABNORMAL LOW (ref 8.9–10.3)
Chloride: 100 mmol/L (ref 98–111)
Creatinine, Ser: 0.67 mg/dL (ref 0.44–1.00)
GFR, Estimated: >60 mL/min (ref >60)
Glucose, Bld: 129 mg/dL — ABNORMAL HIGH (ref 70–99)
Potassium: 3.8 mmol/L (ref 3.5–5.1)
Sodium: 138 mmol/L (ref 135–145)

## 2024-05-19 MED ORDER — ALPRAZOLAM 0.5 MG PO TABS
0.5000 mg | ORAL_TABLET | Freq: Three times a day (TID) | ORAL | Status: DC | PRN
  Administered 2024-05-19 – 2024-05-20 (×3): 0.5 mg via ORAL
  Filled 2024-05-19 (×3): qty 1

## 2024-05-19 MED ADMIN — Amlodipine Besylate Tab 5 MG (Base Equivalent): 5 mg | ORAL | NDC 00904637061

## 2024-05-19 MED FILL — Amlodipine Besylate Tab 5 MG (Base Equivalent): 5.0000 mg | ORAL | Qty: 1 | Status: AC

## 2024-05-19 NOTE — Progress Notes (Signed)
 Physical Therapy Treatment Patient Details Name: Amanda [name] MRN: [id number] DOB: [birth year] Today's Date: 05/19/2024   History of Present Illness Pt s/p L TKR and with hx of bipolar and afib    PT Comments  Pt cooperative but limited by increased pain/fatigue this am.  Pt performed therex but limited and requiring increased time.  Pt up to ambulate to bathroom for toileting with hand hygiene/brushing teeth standing at sink and ambulated to stairs but deferred with pt c/o feeling very fatigued/weak and needing to sit.      If plan is discharge home, recommend the following: A little help with walking and/or transfers;A lot of help with bathing/dressing/bathroom;Help with stairs or ramp for entrance;Assistance with cooking/housework;Assist for transportation   Can travel by private vehicle        Equipment Recommendations  Rolling walker (2 wheels)    Recommendations for Other Services       Precautions / Restrictions Precautions Precautions: Knee;Fall Required Braces or Orthoses: Knee Immobilizer - Left Knee Immobilizer - Left: Discontinue once straight leg raise with < 10 degree lag Restrictions Weight Bearing Restrictions Per Provider Order: No LLE Weight Bearing Per Provider Order: Weight bearing as tolerated     Mobility  Bed Mobility Overal bed mobility: Needs Assistance Bed Mobility: Supine to Sit     Supine to sit: Supervision     General bed mobility comments: for safety only    Transfers Overall transfer level: Needs assistance Equipment used: Rolling walker (2 wheels) Transfers: Sit to/from Stand Sit to Stand: Contact guard assist           General transfer comment: cues for LE management and use of UEs to self assist    Ambulation/Gait Ambulation/Gait assistance: Contact guard assist, Supervision Gait Distance (Feet): 40 Feet (including to bathroom) Assistive device: Rolling walker (2 wheels) Gait Pattern/deviations: Step-to pattern,  Decreased step length - right, Decreased step length - left, Shuffle, Trunk flexed Gait velocity: decr     General Gait Details: cues for sequence, posture and position from [employer] Stairs:  (attempted but pt too fatigued)           Wheelchair Mobility     Tilt Bed    Modified Rankin (Stroke Patients Only)       Balance Overall balance assessment: Needs assistance Sitting-balance support: No upper extremity supported, Feet supported Sitting balance-Leahy Scale: Good     Standing balance support: No upper extremity supported Standing balance-Leahy Scale: Fair                              Hotel Manager: No apparent difficulties  Cognition Arousal: Alert Behavior During Therapy: WFL for tasks assessed/performed, Impulsive   PT - Cognitive impairments: No apparent impairments                         Following commands: Intact      Cueing Cueing Techniques: Verbal cues  Exercises Total Joint Exercises Ankle Circles/Pumps: AROM, Both, 15 reps, Supine Quad Sets: AROM, Both, 10 reps, Supine Heel Slides: AAROM, Left, Supine, 10 reps Straight Leg Raises: AAROM, AROM, Left, Supine, 5 reps    General Comments        Pertinent Vitals/Pain Pain Assessment Pain Assessment: 0-10 Pain Score: 7  Pain Location: L knee Pain Descriptors / Indicators: Aching, Sore Pain Intervention(s): Limited activity within patient's tolerance, Monitored during session, Premedicated before session,  Ice applied    Home Living                          Prior Function            PT Goals (current goals can now be found in the care plan section) Acute Rehab PT Goals Patient Stated Goal: Regain IND PT Goal Formulation: With patient Time For Goal Achievement: 05/25/24 Potential to Achieve Goals: Good Progress towards PT goals: Progressing toward goals    Frequency    7X/week      PT Plan       Co-evaluation              AM-PAC PT 6 Clicks Mobility   Outcome Measure  Help needed turning from your back to your side while in a flat bed without using bedrails?: A Little Help needed moving from lying on your back to sitting on the side of a flat bed without using bedrails?: A Little Help needed moving to and from a bed to a chair (including a wheelchair)?: A Little Help needed standing up from a chair using your arms (e.g., wheelchair or bedside chair)?: A Little Help needed to walk in hospital room?: A Little Help needed climbing 3-5 steps with a railing? : A Little 6 Click Score: 18    End of Session Equipment Utilized During Treatment: Gait belt;Left knee immobilizer Activity Tolerance: Patient tolerated treatment well;Patient limited by pain Patient left: with call bell/phone within reach;in chair;with chair alarm set Nurse Communication: Mobility status PT Visit Diagnosis: Difficulty in walking, not elsewhere classified (R26.2)     Time: 1020-1046 PT Time Calculation (min) (ACUTE ONLY): 26 min  Charges:    $Therapeutic Exercise: 8-22 mins $Therapeutic Activity: 8-22 mins PT General Charges $$ ACUTE PT VISIT: 1 Visit                     [facility] PT Acute Rehabilitation Services Office [phone]    Ladell Bey 05/19/2024, 12:25 PM

## 2024-05-19 NOTE — Progress Notes (Signed)
 Physical Therapy Treatment Patient Details Name: Amanda [name] MRN: [id number] DOB: 1955/07/20 Today's Date: 05/19/2024   History of Present Illness Pt s/p L TKR and with hx of bipolar and afib    PT Comments  Pt continues cooperative but impulsive and pain limited.  Pt up to bathroom for toileting with hand hygiene standing at sink and ambulated limited distance in hallway to negotiate stairs before returning to bed with cold packs in place.  Pt eager for dc home this date.    If plan is discharge home, recommend the following: A little help with walking and/or transfers;A lot of help with bathing/dressing/bathroom;Help with stairs or ramp for entrance;Assistance with cooking/housework;Assist for transportation   Can travel by private vehicle        Equipment Recommendations  Rolling walker (2 wheels)    Recommendations for Other Services       Precautions / Restrictions Precautions Precautions: Knee;Fall Required Braces or Orthoses: Knee Immobilizer - Left Knee Immobilizer - Left: Discontinue once straight leg raise with < 10 degree lag Restrictions Weight Bearing Restrictions Per Provider Order: No LLE Weight Bearing Per Provider Order: Weight bearing as tolerated     Mobility  Bed Mobility Overal bed mobility: Needs Assistance Bed Mobility: Sit to Supine     Supine to sit: Supervision Sit to supine: Supervision   General bed mobility comments: for safety only    Transfers Overall transfer level: Needs assistance Equipment used: Rolling walker (2 wheels) Transfers: Sit to/from Stand Sit to Stand: Contact guard assist, Supervision           General transfer comment: cues for LE management and use of UEs to self assist    Ambulation/Gait Ambulation/Gait assistance: Contact guard assist, Supervision Gait Distance (Feet): 50 Feet (including up to bathroom) Assistive device: Rolling walker (2 wheels) Gait Pattern/deviations: Step-to pattern, Decreased  step length - right, Decreased step length - left, Shuffle, Trunk flexed Gait velocity: decr     General Gait Details: cues for sequence, posture and position from RW   Stairs Stairs: Yes Stairs assistance: Min assist Stair Management: One rail Left, Step to pattern, Forwards, With crutches Number of Stairs: 3 General stair comments: cues for sequence with written instruction provided   Wheelchair Mobility     Tilt Bed    Modified Rankin (Stroke Patients Only)       Balance Overall balance assessment: Needs assistance Sitting-balance support: No upper extremity supported, Feet supported Sitting balance-Leahy Scale: Good     Standing balance support: No upper extremity supported Standing balance-Leahy Scale: Fair                              Hotel Manager: No apparent difficulties  Cognition Arousal: Alert Behavior During Therapy: WFL for tasks assessed/performed, Impulsive   PT - Cognitive impairments: No apparent impairments                         Following commands: Intact      Cueing Cueing Techniques: Verbal cues  Exercises Total Joint Exercises Ankle Circles/Pumps: AROM, Both, 15 reps, Supine Quad Sets: AROM, Both, 10 reps, Supine Heel Slides: AAROM, Left, Supine, 10 reps Straight Leg Raises: AAROM, AROM, Left, Supine, 5 reps    General Comments        Pertinent Vitals/Pain Pain Assessment Pain Assessment: No/denies pain Pain Score: 7  Pain Location: L knee Pain Descriptors / Indicators:  Aching, Sore Pain Intervention(s): Limited activity within patient's tolerance, Monitored during session, Premedicated before session, Ice applied    Home Living                          Prior Function            PT Goals (current goals can now be found in the care plan section) Acute Rehab PT Goals Patient Stated Goal: Regain IND PT Goal Formulation: With patient Time For Goal Achievement:  05/25/24 Potential to Achieve Goals: Good Progress towards PT goals: Progressing toward goals    Frequency    7X/week      PT Plan      Co-evaluation              AM-PAC PT 6 Clicks Mobility   Outcome Measure  Help needed turning from your back to your side while in a flat bed without using bedrails?: A Little Help needed moving from lying on your back to sitting on the side of a flat bed without using bedrails?: A Little Help needed moving to and from a bed to a chair (including a wheelchair)?: A Little Help needed standing up from a chair using your arms (e.g., wheelchair or bedside chair)?: A Little Help needed to walk in hospital room?: A Little Help needed climbing 3-5 steps with a railing? : A Little 6 Click Score: 18    End of Session Equipment Utilized During Treatment: Gait belt;Left knee immobilizer Activity Tolerance: Patient tolerated treatment well;Patient limited by pain Patient left: with call bell/phone within reach;in chair;with chair alarm set Nurse Communication: Mobility status PT Visit Diagnosis: Difficulty in walking, not elsewhere classified (R26.2)     Time: 8894-8873 PT Time Calculation (min) (ACUTE ONLY): 21 min  Charges:    $Gait Training: 8-22 mins $Therapeutic Exercise: 8-22 mins $Therapeutic Activity: 8-22 mins PT General Charges $$ ACUTE PT VISIT: 1 Visit                     [facility] PT Acute Rehabilitation Services Office [phone]    [facility] 05/19/2024, 12:34 PM

## 2024-05-19 NOTE — Consult Note (Signed)
 Initial Consultation Note   Patient: Amanda [name] FMW:[id number] DOB: Feb 11, 1956 PCP: Oris Camie BRAVO, NP DOA: 05/17/2024 DOS: the patient was seen and examined on 05/19/2024 Primary service: Duwayne Purchase, MD  Referring physician: Dr Duwayne Reason for consult: Uncontrolled BP  Assessment/Plan:  Uncontrolled blood pressure - Could be related to postop pain versus stress.  For now we will start patient on Norvasc 5 mg daily.  Start IV as needed as necessary.  Continue to control her pain, defer to orthopedic. -Eventually for outpatient her blood pressure improves, she can follow-up with PCP and can come off of medications  Osteoarthritis status post left knee replacement - Postop management per orthopedic team  History of COPD - As needed bronchodilators  Prior history of atrial fibrillation status post ablation - Patient sees outpatient EP.  No further workup indicated in the hospital  History of depression - Continue Celexa , Lamictal , Risperdal    TRH will continue to follow the patient.  HPI: Amanda [name] is a 69 y.o. female with past medical history of osteoarthritis, COPD, tobacco use, GERD, anxiety, history of A-fib status post ablation admitted to the hospital for left knee replacement which was done by orthopedic on 05/17/2024.  Postop patient doing well but blood pressure starting to trend upwards therefore medicine team was consulted.  When I saw the patient she reported a mild headache otherwise no other complaints.  Overall she states she feels great.  Denies any prior history of taking antihypertensives.  Review of Systems: As mentioned in the history of present illness. All other systems reviewed and are negative. Past Medical History:  Diagnosis Date   Acute urinary retention 03/30/2024   Arthritis    R foot and L knee   Atrial fib/flutter, transient (HCC)    Atrial fibrillation (HCC)    COPD (chronic obstructive pulmonary disease) (HCC)     DEGENERATIVE JOINT DISEASE 11/12/2006   DISORDER, BIPOLAR NOS 02/28/2007   Easy bruising 01/17/2023   GERD 11/12/2006   Headache    migraines   Major depressive disorder, single episode, unspecified 07/15/2021   Past Surgical History:  Procedure Laterality Date   ABDOMINAL HYSTERECTOMY     ATRIAL FIBRILLATION ABLATION N/A 07/26/2023   Procedure: ATRIAL FIBRILLATION ABLATION;  Surgeon: Nancey Eulas BRAVO, MD;  Location: [facility] INVASIVE CV [facility];  Service: Cardiovascular;  Laterality: N/A;   BREAST SURGERY     reduction   KNEE ARTHROSCOPY Right    TONSILLECTOMY     TOTAL KNEE ARTHROPLASTY Left 05/17/2024   Procedure: ARTHROPLASTY, KNEE, TOTAL;  Surgeon: Duwayne Purchase, MD;  Location: [facility] ORS;  Service: Orthopedics;  Laterality: Left;   Social History:  reports that she quit smoking about a year ago. Her smoking use included cigarettes. She has a 90 pack-year smoking history. She has never used smokeless tobacco. She reports current drug use. Frequency: 7.00 times per week. Drug: Marijuana. She reports that she does not drink alcohol.  Allergies[1]  Family History  Problem Relation Age of Onset   Dementia Mother    Congestive Heart Failure Father    Migraines Father    Dementia Maternal Aunt    Dementia Maternal Aunt    Dementia Maternal Grandmother     Prior to Admission medications  Medication Sig Start Date End Date Taking? Authorizing Provider  albuterol (VENTOLIN HFA) 108 (90 Base) MCG/ACT inhaler Inhale 2 puffs into the lungs every 6 (six) hours as needed for shortness of breath or wheezing. 05/08/21  Yes [provider]  baclofen  (  LIORESAL ) 10 MG tablet Take 10 mg by mouth 2 (two) times daily. 08/20/21  Yes [provider]  Buprenorphine  HCl-Naloxone  HCl 1.4-0.36 MG SUBL Place 1-2 tablets under the tongue See admin instructions. Take 2 tablets by mouth in the morning and take 1 tablet in afternoon   Yes [provider]  CELEBREX 200 MG capsule Take 200 mg  by mouth in the morning. 04/24/24  Yes [provider]  citalopram  (CELEXA ) 20 MG tablet Take 1 tablet (20 mg total) by mouth at bedtime. 09/14/23  Yes Hurst, Verneita DASEN, PA-C  estradiol (CLIMARA - DOSED IN MG/24 HR) 0.1 mg/24hr patch Place 0.1 mg onto the skin daily as needed (excessive sweats).   Yes [provider]  ezetimibe  (ZETIA ) 10 MG tablet Take 10 mg by mouth at bedtime.   Yes [provider]  famotidine (PEPCID) 20 MG tablet Take 20 mg by mouth 2 (two) times daily as needed for indigestion or heartburn.   Yes [provider]  furosemide  (LASIX ) 40 MG tablet Take 1 tablet (40 mg total) by mouth daily as needed for edema. 03/30/24  Yes Early, Sara E, NP  gabapentin  (NEURONTIN ) 100 MG capsule Take 1 capsule (100 mg total) by mouth at bedtime. 09/14/23  Yes Hurst, Verneita T, PA-C  Galcanezumab -gnlm (EMGALITY ) 120 MG/ML SOAJ Inject 120 mg into the skin every 30 (thirty) days. 04/18/24  Yes Onita Duos, MD  lamoTRIgine  (LAMICTAL ) 100 MG tablet Take 1 tablet (100 mg total) by mouth daily. Patient taking differently: Take 100 mg by mouth at bedtime. 09/14/23  Yes Hurst, Verneita DASEN, PA-C  magnesium  oxide (MAG-OX) 400 (240 Mg) MG tablet Take 400 mg by mouth 2 (two) times daily.   Yes [provider]  meloxicam (MOBIC) 7.5 MG tablet Take 7.5 mg by mouth in the morning. 10/01/23  Yes [provider]  mometasone  (ELOCON ) 0.1 % cream Apply to the finger tips twice a day until peeling has stopped. Patient taking differently: Apply 1 Application topically at bedtime. 03/30/24  Yes Early, Sara E, NP  ondansetron  (ZOFRAN -ODT) 4 MG disintegrating tablet Take 4 mg by mouth every 8 (eight) hours as needed for vomiting or nausea.   Yes [provider]  pantoprazole  (PROTONIX ) 40 MG tablet Take 1 tablet (40 mg total) by mouth daily. For reflux and bloating 04/12/24  Yes Early, Sara E, NP  risperiDONE  (RISPERDAL ) 1 MG tablet Take 1 tablet (1 mg total) by mouth at  bedtime. 09/14/23  Yes Hurst, Verneita T, PA-C  Vitamin D , Ergocalciferol , (DRISDOL ) 1.25 MG (50000 UNIT) CAPS capsule Take 1 capsule by mouth once a week Patient taking differently: Take 50,000 Units by mouth every Friday. 01/03/24  Yes Early, Sara E, NP  oxybutynin (DITROPAN XL) 5 MG 24 hr tablet Take 1 tablet (5 mg total) by mouth at bedtime. 05/17/24   Oris Camie BRAVO, NP    Physical Exam: Vitals:   05/19/24 1227 05/19/24 1308 05/19/24 1521 05/19/24 1733  BP: (!) 175/89 (!) 180/77 (!) 165/78 (!) 168/74  Pulse: 93 100 90 63  Resp:  16 16   Temp:  98.2 F (36.8 C) 98 F (36.7 C)   TempSrc:      SpO2: 97% 95% 93% 100%  Weight:      Height:        Family Communication: Family at bedside Primary team communication: Notified Thank you very much for involving us  in the care of your patient.  Author: Burgess JAYSON Dare, MD 05/19/2024 6:02  PM  For on call review [email or web address].     [1]  Allergies Allergen Reactions   Augmentin [Amoxicillin-Pot Clavulanate] Other (See Comments)    Has patient had a PCN reaction causing immediate rash, facial/tongue/throat swelling, SOB or lightheadedness with hypotension: No Has patient had a PCN reaction causing severe rash involving mucus membranes or skin necrosis: No Has patient had a PCN reaction that required hospitalization: No Has patient had a PCN reaction occurring within the last 10 years: No If all of the above answers are NO, then may proceed with Cephalosporin use.    Imitrex [Sumatriptan] Other (See Comments)    Gi upset   Doxycycline Hyclate Hives and Rash

## 2024-05-19 NOTE — TOC Transition Note (Signed)
 Transition of Care [facility]) - Discharge Note   Patient Details  Name: Amanda [name] MRN: [id number] Date of Birth: [birth year]  Transition of [facility]) CM/SW Contact:  NORMAN ASPEN, LCSW Phone Number: 05/19/2024, 9:34 AM   Clinical Narrative:     Met with pt who reports need for RW and no DME agency preference.  Order placed with [employer] and item to be delivered to room prior to dc today.  HHPT prearranged with [facility] via ortho MD office.  No further IP CM needs.  Final next level of care: Home w Home Health Services Barriers to Discharge: No Barriers Identified   Patient Goals and CMS Choice Patient states their goals for this hospitalization and ongoing recovery are:: return home          Discharge Placement                       Discharge Plan and Services Additional resources added to the After Visit Summary for                  DME Arranged: Walker rolling DME Agency: [employer] Date DME Agency Contacted: 05/19/24 Time DME Agency Contacted: [phone] Representative spoke with at DME Agency: [facility] Arranged: PT HH Agency: [facility]        Social Drivers of Health (SDOH) Interventions SDOH Screenings   Food Insecurity: No Food Insecurity (05/17/2024)  Housing: Low Risk (05/17/2024)  Transportation Needs: No Transportation Needs (05/17/2024)  Utilities: Not At Risk (05/17/2024)  Alcohol Screen: Low Risk (09/07/2023)  Depression (PHQ2-9): Low Risk (04/13/2024)  Financial Resource Strain: Low Risk (04/13/2024)  Physical Activity: Inactive (04/13/2024)  Social Connections: Moderately Isolated (05/17/2024)  Stress: No Stress Concern Present (04/13/2024)  Tobacco Use: Medium Risk (05/17/2024)  Health Literacy: Adequate Health Literacy (04/13/2024)     Readmission Risk Interventions     No data to display

## 2024-05-19 NOTE — Progress Notes (Signed)
 Subjective: 2 Days Post-Op Procedures (LRB): ARTHROPLASTY, KNEE, TOTAL (Left) Patient reports pain as moderate and severe.  Dilaudid  PO last night, did not tolerate well.  Objective: Vital signs in last 24 hours: Temp:  [98.1 F (36.7 C)-98.6 F (37 C)] 98.6 F (37 C) (12/12 0610) Pulse Rate:  [62-101] 101 (12/12 0610) Resp:  [14-20] 20 (12/12 0610) BP: (141-198)/(64-111) 195/86 (12/12 0610) SpO2:  [96 %-98 %] 96 % (12/12 0610)  Intake/Output from previous day: 12/11 0701 - 12/12 0700 In: 1032.6 [P.O.:960; I.V.:72.6] Out: 200 [Urine:200] Intake/Output this shift: Total I/O In: 240 [P.O.:240] Out: -   Recent Labs    05/18/24 0317 05/19/24 0318  HGB 10.9* 10.8*   Recent Labs    05/18/24 0317 05/19/24 0318  WBC 15.8* 12.7*  RBC 4.08 4.05  HCT 35.3* 34.4*  PLT 242 237   Recent Labs    05/18/24 0317 05/19/24 0318  NA 139 138  K 4.5 3.8  CL 104 100  CO2 25 27  BUN 13 12  CREATININE 0.72 0.67  GLUCOSE 127* 129*  CALCIUM 9.1 8.8*   No results for input(s): LABPT, INR in the last 72 hours.  Neurologically intact ABD soft Neurovascular intact Sensation intact distally Intact pulses distally Dorsiflexion/Plantar flexion intact Incision: dressing C/D/I and no drainage No cellulitis present Compartment soft No sign of DVT   Assessment/Plan: 2 Days Post-Op Procedures (LRB): ARTHROPLASTY, KNEE, TOTAL (Left) Advance diet Up with therapy D/C IV fluids Did not like PO dilaudid  - will keep on PO oxy Asked for anxiolytic, has done well with xanax in the past Will plan for d/c home later today   Amanda [name] 05/19/2024, 9:11 AM

## 2024-05-20 ENCOUNTER — Encounter: Payer: Self-pay | Admitting: Nurse Practitioner

## 2024-05-20 ENCOUNTER — Other Ambulatory Visit (HOSPITAL_COMMUNITY): Payer: Self-pay

## 2024-05-20 DIAGNOSIS — M1712 Unilateral primary osteoarthritis, left knee: Secondary | ICD-10-CM | POA: Diagnosis not present

## 2024-05-20 DIAGNOSIS — I10 Essential (primary) hypertension: Secondary | ICD-10-CM | POA: Insufficient documentation

## 2024-05-20 MED ORDER — DOCUSATE SODIUM 100 MG PO CAPS
100.0000 mg | ORAL_CAPSULE | Freq: Two times a day (BID) | ORAL | 2 refills | Status: AC
  Filled 2024-05-20: qty 60, 30d supply, fill #0

## 2024-05-20 MED ORDER — POLYETHYLENE GLYCOL 3350 17 GM/SCOOP PO POWD
17.0000 g | Freq: Every day | ORAL | 0 refills | Status: AC
  Filled 2024-05-20: qty 238, 14d supply, fill #0

## 2024-05-20 MED ORDER — ASPIRIN 81 MG PO TBEC
81.0000 mg | DELAYED_RELEASE_TABLET | Freq: Two times a day (BID) | ORAL | 1 refills | Status: AC
  Filled 2024-05-20: qty 60, 30d supply, fill #0

## 2024-05-20 MED ORDER — OXYCODONE-ACETAMINOPHEN 10-325 MG PO TABS
1.0000 | ORAL_TABLET | ORAL | 0 refills | Status: DC | PRN
  Filled 2024-05-20: qty 40, 4d supply, fill #0

## 2024-05-20 MED ORDER — AMLODIPINE BESYLATE 5 MG PO TABS
5.0000 mg | ORAL_TABLET | Freq: Every day | ORAL | 0 refills | Status: AC
  Filled 2024-05-20: qty 7, 7d supply, fill #0

## 2024-05-20 MED ADMIN — Amlodipine Besylate Tab 5 MG (Base Equivalent): 5 mg | ORAL | NDC 00904637061

## 2024-05-20 NOTE — Hospital Course (Addendum)
 Brief Narrative:    68 y.o. female with past medical history of osteoarthritis, COPD, tobacco use, GERD, anxiety, history of A-fib status post ablation admitted to the hospital for left knee replacement which was done by orthopedic on 05/17/2024.  Postop patient doing well but blood pressure starting to trend upwards therefore medicine team was consulted.  When I saw the patient she reported a mild headache otherwise no other complaints.  Overall she states she feels great.  Denies any prior history of taking antihypertensives.  Started Norvasc  5 mg daily.  Assessment & Plan:   Uncontrolled blood pressure - Could be related to postop pain versus stress.  For now we will start patient on Norvasc  5 mg daily.  We can uptitrate this as necessary.  IV as needed ordered. -Eventually for outpatient her blood pressure improves, she can follow-up with PCP and can come off of medications   Osteoarthritis status post left knee replacement - Postop management per orthopedic team   History of COPD - As needed bronchodilators   Prior history of atrial fibrillation status post ablation - Patient sees outpatient EP.  No further workup indicated in the hospital   History of depression - Continue Celexa , Lamictal , Risperdal    Okay to discharge from medicine standpoint    Subjective: Doing well no complaints, headache resolved   Examination:  General exam: Appears calm and comfortable  Respiratory system: Clear to auscultation. Respiratory effort normal. Cardiovascular system: S1 & S2 heard, RRR. No JVD, murmurs, rubs, gallops or clicks. No pedal edema. Gastrointestinal system: Abdomen is nondistended, soft and nontender. No organomegaly or masses felt. Normal bowel sounds heard. Central nervous system: Alert and oriented. No focal neurological deficits. Extremities: Symmetric 5 x 5 power. Skin: No rashes, lesions or ulcers Psychiatry: Judgement and insight appear normal. Mood & affect  appropriate.

## 2024-05-20 NOTE — Plan of Care (Signed)
°  Problem: Health Behavior/Discharge Planning: Goal: Ability to manage health-related needs will improve Outcome: Adequate for Discharge   Problem: Clinical Measurements: Goal: Ability to maintain clinical measurements within normal limits will improve Outcome: Adequate for Discharge Goal: Will remain free from infection Outcome: Progressing Goal: Diagnostic test results will improve Outcome: Progressing Goal: Respiratory complications will improve Outcome: Adequate for Discharge Goal: Cardiovascular complication will be avoided Outcome: Adequate for Discharge   Problem: Activity: Goal: Risk for activity intolerance will decrease Outcome: Completed/Met   Problem: Coping: Goal: Level of anxiety will decrease Outcome: Progressing   Problem: Elimination: Goal: Will not experience complications related to bowel motility Outcome: Progressing   Problem: Pain Managment: Goal: General experience of comfort will improve and/or be controlled Outcome: Adequate for Discharge   Problem: Safety: Goal: Ability to remain free from injury will improve Outcome: Adequate for Discharge   Problem: Skin Integrity: Goal: Risk for impaired skin integrity will decrease Outcome: Adequate for Discharge   Problem: Education: Goal: Knowledge of the prescribed therapeutic regimen will improve Outcome: Adequate for Discharge   Problem: Activity: Goal: Ability to avoid complications of mobility impairment will improve Outcome: Completed/Met Goal: Range of joint motion will improve Outcome: Adequate for Discharge   Problem: Clinical Measurements: Goal: Postoperative complications will be avoided or minimized Outcome: Adequate for Discharge   Problem: Pain Management: Goal: Pain level will decrease with appropriate interventions Outcome: Adequate for Discharge   Problem: Skin Integrity: Goal: Will show signs of wound healing Outcome: Completed/Met

## 2024-05-20 NOTE — Progress Notes (Signed)
° ° °  Subjective: 3 Days Post-Op Procedures (LRB): ARTHROPLASTY, KNEE, TOTAL (Left) Patient reports pain as 5 on 0-10 scale.   Denies CP or SOB.  Voiding without difficulty. Positive flatus. Objective: Vital signs in last 24 hours: Temp:  [98 F (36.7 C)-98.7 F (37.1 C)] 98.7 F (37.1 C) (12/13 0521) Pulse Rate:  [63-101] 98 (12/13 0521) Resp:  [16-18] 18 (12/13 0521) BP: (145-186)/(71-89) 145/71 (12/13 0521) SpO2:  [93 %-100 %] 93 % (12/13 0521)  Intake/Output from previous day: 12/12 0701 - 12/13 0700 In: 1320 [P.O.:1320] Out: -  Intake/Output this shift: No intake/output data recorded.  Labs: Recent Labs    05/18/24 0317 05/19/24 0318  HGB 10.9* 10.8*   Recent Labs    05/18/24 0317 05/19/24 0318  WBC 15.8* 12.7*  RBC 4.08 4.05  HCT 35.3* 34.4*  PLT 242 237   Recent Labs    05/18/24 0317 05/19/24 0318  NA 139 138  K 4.5 3.8  CL 104 100  CO2 25 27  BUN 13 12  CREATININE 0.72 0.67  GLUCOSE 127* 129*  CALCIUM 9.1 8.8*   No results for input(s): LABPT, INR in the last 72 hours.  Physical Exam: Neurovascular intact Sensation intact distally Intact pulses distally Dorsiflexion/Plantar flexion intact Incision: dressing C/D/I No cellulitis present Compartment soft Body mass index is 31.09 kg/m.   Assessment/Plan: 3 Days Post-Op Procedures (LRB): ARTHROPLASTY, KNEE, TOTAL (Left) Continue Care Continue pain medical management  Continue Incentive spirometry  Continue mobilization  Continue to ice surgical site WBAT ASA for DVT prophylaxis Hgb was 10.8  Plan to d/c to home today with Norvasc  5mg  daily per hospitalist instructions with PCP follow up   Jeoffrey LOISE Sages PA-C for Dr. Donaciano Sprang [facility] [phone] 05/20/2024, 8:35 AM

## 2024-05-20 NOTE — Discharge Summary (Signed)
 Patient ID: Amanda [name] MRN: [id number] DOB/AGE: 66-Oct-1957 66 y.o.  Admit date: 05/17/2024 Discharge date: 05/20/2024  Admission Diagnoses:  Principal Problem:   Left knee DJD   Discharge Diagnoses:  Principal Problem:   Left knee DJD  status post Procedures: ARTHROPLASTY, KNEE, TOTAL  Past Medical History:  Diagnosis Date   Acute urinary retention 03/30/2024   Arthritis    R foot and L knee   Atrial fib/flutter, transient (HCC)    Atrial fibrillation (HCC)    COPD (chronic obstructive pulmonary disease) (HCC)    DEGENERATIVE JOINT DISEASE 11/12/2006   DISORDER, BIPOLAR NOS 02/28/2007   Easy bruising 01/17/2023   GERD 11/12/2006   Headache    migraines   Major depressive disorder, single episode, unspecified 07/15/2021    Surgeries: Procedures: ARTHROPLASTY, KNEE, TOTAL on 05/17/2024   Consultants:   Discharged Condition: Improved  Hospital Course: Amanda [name] is an 68 y.o. female who was admitted 05/17/2024 for operative treatment of Left knee DJD. Patient failed conservative treatments (please see the history and physical for the specifics) and had severe unremitting pain that affects sleep, daily activities and work/hobbies. After pre-op clearance, the patient was taken to the operating room on 05/17/2024 and underwent  Procedures: ARTHROPLASTY, KNEE, TOTAL.    Patient was given perioperative antibiotics:  Anti-infectives (From admission, onward)    Start     Dose/Rate Route Frequency Ordered Stop   05/17/24 1800  ceFAZolin  (ANCEF ) IVPB 2g/100 mL premix        2 g 200 mL/hr over 30 Minutes Intravenous Every 6 hours 05/17/24 1541 05/18/24 0200   05/17/24 0945  ceFAZolin  (ANCEF ) IVPB 2g/100 mL premix        2 g 200 mL/hr over 30 Minutes Intravenous On call to O.R. 05/17/24 0932 05/17/24 1218        Patient was given sequential compression devices and early ambulation to prevent DVT.   Patient benefited maximally from hospital stay and  there were no complications. At the time of discharge, the patient was urinating/moving their bowels without difficulty, tolerating a regular diet, pain is controlled with oral pain medications and they have been cleared by PT/OT.   Recent vital signs: Patient Vitals for the past 24 hrs:  BP Temp Temp src Pulse Resp SpO2  05/20/24 0521 (!) 145/71 98.7 F (37.1 C) Oral 98 18 93 %  05/19/24 2108 (!) 167/81 98.3 F (36.8 C) Oral 91 18 93 %  05/19/24 1733 (!) 168/74 -- -- 63 -- 100 %  05/19/24 1521 (!) 165/78 98 F (36.7 C) -- 90 16 93 %  05/19/24 1308 (!) 180/77 98.2 F (36.8 C) -- 100 16 95 %  05/19/24 1227 (!) 175/89 -- -- 93 -- 97 %     Recent laboratory studies:  Recent Labs    05/18/24 0317 05/19/24 0318  WBC 15.8* 12.7*  HGB 10.9* 10.8*  HCT 35.3* 34.4*  PLT 242 237  NA 139 138  K 4.5 3.8  CL 104 100  CO2 25 27  BUN 13 12  CREATININE 0.72 0.67  GLUCOSE 127* 129*  CALCIUM 9.1 8.8*     Discharge Medications:   Allergies as of 05/20/2024       Reactions   Augmentin [amoxicillin-pot Clavulanate] Other (See Comments)   Has patient had a PCN reaction causing immediate rash, facial/tongue/throat swelling, SOB or lightheadedness with hypotension: No Has patient had a PCN reaction causing severe rash involving mucus membranes or skin necrosis: No Has  patient had a PCN reaction that required hospitalization: No Has patient had a PCN reaction occurring within the last 10 years: No If all of the above answers are NO, then may proceed with Cephalosporin use.   Imitrex [sumatriptan] Other (See Comments)   Gi upset   Doxycycline Hyclate Hives, Rash        Medication List     STOP taking these medications    meloxicam 7.5 MG tablet Commonly known as: MOBIC       TAKE these medications    albuterol  108 (90 Base) MCG/ACT inhaler Commonly known as: VENTOLIN  HFA Inhale 2 puffs into the lungs every 6 (six) hours as needed for shortness of breath or wheezing.    amLODipine  5 MG tablet Commonly known as: NORVASC  Take 1 tablet (5 mg total) by mouth daily.   aspirin  EC 81 MG tablet Take 1 tablet (81 mg total) by mouth 2 (two) times daily after a meal. Day after surgery   baclofen  10 MG tablet Commonly known as: LIORESAL  Take 10 mg by mouth 2 (two) times daily.   Buprenorphine  HCl-Naloxone  HCl 1.4-0.36 MG Subl Place 1-2 tablets under the tongue See admin instructions. Take 2 tablets by mouth in the morning and take 1 tablet in afternoon   CeleBREX  200 MG capsule Generic drug: celecoxib  Take 200 mg by mouth in the morning.   citalopram  20 MG tablet Commonly known as: CELEXA  Take 1 tablet (20 mg total) by mouth at bedtime.   docusate sodium  100 MG capsule Commonly known as: Colace Take 1 capsule (100 mg total) by mouth 2 (two) times daily.   Emgality  120 MG/ML Soaj Generic drug: Galcanezumab -gnlm Inject 120 mg into the skin every 30 (thirty) days.   estradiol  0.1 mg/24hr patch Commonly known as: CLIMARA  - Dosed in mg/24 hr Place 0.1 mg onto the skin daily as needed (excessive sweats).   ezetimibe  10 MG tablet Commonly known as: ZETIA  Take 10 mg by mouth at bedtime.   famotidine  20 MG tablet Commonly known as: PEPCID  Take 20 mg by mouth 2 (two) times daily as needed for indigestion or heartburn.   furosemide  40 MG tablet Commonly known as: LASIX  Take 1 tablet (40 mg total) by mouth daily as needed for edema.   gabapentin  100 MG capsule Commonly known as: NEURONTIN  Take 1 capsule (100 mg total) by mouth at bedtime.   lamoTRIgine  100 MG tablet Commonly known as: LAMICTAL  Take 1 tablet (100 mg total) by mouth daily. What changed: when to take this   magnesium  oxide 400 (240 Mg) MG tablet Commonly known as: MAG-OX Take 400 mg by mouth 2 (two) times daily.   mometasone  0.1 % cream Commonly known as: ELOCON  Apply to the finger tips twice a day until peeling has stopped. What changed:  how much to take how to take  this when to take this additional instructions   ondansetron  4 MG disintegrating tablet Commonly known as: ZOFRAN -ODT Take 4 mg by mouth every 8 (eight) hours as needed for vomiting or nausea.   oxybutynin  5 MG 24 hr tablet Commonly known as: Ditropan  XL Take 1 tablet (5 mg total) by mouth at bedtime.   oxyCODONE -acetaminophen  10-325 MG tablet Commonly known as: Percocet Take 1-2 tablets by mouth every 4 (four) hours as needed for pain (severe).   pantoprazole  40 MG tablet Commonly known as: PROTONIX  Take 1 tablet (40 mg total) by mouth daily. For reflux and bloating   polyethylene glycol powder 17 GM/SCOOP powder Commonly known  as: GLYCOLAX /MIRALAX  Take 17 g by mouth daily. Dissolve 1 capful (17g) in 4-8 ounces of liquid and take by mouth daily.   risperiDONE  1 MG tablet Commonly known as: RISPERDAL  Take 1 tablet (1 mg total) by mouth at bedtime.   Vitamin D  (Ergocalciferol ) 1.25 MG (50000 UNIT) Caps capsule Commonly known as: DRISDOL  Take 1 capsule by mouth once a week What changed: when to take this               Durable Medical Equipment  (From admission, onward)           Start     Ordered   05/19/24 0912  For home use only DME Walker rolling  Once       Question Answer Comment  Walker: With 5 Inch Wheels   Patient needs a walker to treat with the following condition S/P total knee replacement      05/19/24 0911   05/18/24 1631  For home use only DME 4 wheeled rolling walker with seat  Once       Question:  Patient needs a walker to treat with the following condition  Answer:  S/P total knee replacement   05/18/24 1630            Diagnostic Studies: DG Knee 1-2 Views Left Result Date: 05/17/2024 CLINICAL DATA:  Left total knee arthroplasty EXAM: LEFT KNEE - 1-2 VIEW COMPARISON:  None Available. FINDINGS: Frontal and cross-table lateral views of the left knee are obtained. 3 component left knee arthroplasty is identified in the expected position  without evidence of acute complication. Postsurgical changes are seen in the soft tissues. IMPRESSION: 1. Unremarkable left knee arthroplasty. Electronically Signed   By: Ozell Daring M.D.   On: 05/17/2024 15:23       Follow-up Information     Duwayne Purchase, MD Follow up in 2 week(s).   Specialty: Orthopedic Surgery Contact information: [address] [place] [address] [phone]         [facility] Follow up.   Specialty: Home Health Services Why: to provide home physical therapy visits Contact information: [address] [place] [address] [phone]                 Discharge Plan:  discharge to home with home health services   Disposition:  Amanda [name] is a very pleasant 68 year old female who is POD3 Left total knee arthroplasty. Surgical intervention was successful and without complications. Her hospital course has been uncomplicated. She was having some hypertension which was controlled by adding Norvasc  5mg  daily per the hospitalist team, she will follow up with her PCP outpatient. She is ambulating on her own. She is tolerating oral intake well. She is complaint with the incentive spirometer. She reports that his pain is well controled with oral pain medications. Dressing is c/d/I.  Positive void, positive flatus, positive BM. Patient will follow up with us  in clinic in 2 weeks. All questions were welcomed and addressed.    Signed: Jeoffrey LOISE Sages PA-C for Dr. DOROTHA Duwayne, MD [facility] [phone] 05/20/2024, 12:04 PM

## 2024-05-20 NOTE — Progress Notes (Signed)
 Spoke with Amber,PT. Confirmed that patient is cleared to D/C as of 0835. Does not require another round of PT.

## 2024-05-20 NOTE — Progress Notes (Signed)
 Discharge meds in a secure bag delivered to patient in room by this RN. Ride enroute. Patient confirmed she would not take the suboxone  while taking the percoet at home/

## 2024-05-20 NOTE — Progress Notes (Signed)
 PROGRESS NOTE    Amanda [name]  FMW:[id number] DOB: [birth year] DOA: 05/17/2024 PCP: Oris Camie BRAVO, NP    Brief Narrative:    68 y.o. female with past medical history of osteoarthritis, COPD, tobacco use, GERD, anxiety, history of A-fib status post ablation admitted to the hospital for left knee replacement which was done by orthopedic on 05/17/2024.  Postop patient doing well but blood pressure starting to trend upwards therefore medicine team was consulted.  When I saw the patient she reported a mild headache otherwise no other complaints.  Overall she states she feels great.  Denies any prior history of taking antihypertensives.  Started Norvasc  5 mg daily.  Assessment & Plan:   Uncontrolled blood pressure - Could be related to postop pain versus stress.  For now we will start patient on Norvasc  5 mg daily.  We can uptitrate this as necessary.  IV as needed ordered. -Eventually for outpatient her blood pressure improves, she can follow-up with PCP and can come off of medications   Osteoarthritis status post left knee replacement - Postop management per orthopedic team   History of COPD - As needed bronchodilators   Prior history of atrial fibrillation status post ablation - Patient sees outpatient EP.  No further workup indicated in the hospital   History of depression - Continue Celexa , Lamictal , Risperdal    Okay to discharge from medicine standpoint    Subjective: Doing well no complaints, headache resolved   Examination:  General exam: Appears calm and comfortable  Respiratory system: Clear to auscultation. Respiratory effort normal. Cardiovascular system: S1 & S2 heard, RRR. No JVD, murmurs, rubs, gallops or clicks. No pedal edema. Gastrointestinal system: Abdomen is nondistended, soft and nontender. No organomegaly or masses felt. Normal bowel sounds heard. Central nervous system: Alert and oriented. No focal neurological deficits. Extremities: Symmetric 5 x  5 power. Skin: No rashes, lesions or ulcers Psychiatry: Judgement and insight appear normal. Mood & affect appropriate.                Diet Orders (From admission, onward)     Start     Ordered   05/17/24 1432  Diet regular Room service appropriate? Yes; Fluid consistency: Thin  Diet effective now       Question Answer Comment  Room service appropriate? Yes   Fluid consistency: Thin      05/17/24 1431            Objective: Vitals:   05/19/24 1521 05/19/24 1733 05/19/24 2108 05/20/24 0521  BP: (!) 165/78 (!) 168/74 (!) 167/81 (!) 145/71  Pulse: 90 63 91 98  Resp: 16  18 18   Temp: 98 F (36.7 C)  98.3 F (36.8 C) 98.7 F (37.1 C)  TempSrc:   Oral Oral  SpO2: 93% 100% 93% 93%  Weight:      Height:        Intake/Output Summary (Last 24 hours) at 05/20/2024 1107 Last data filed at 05/20/2024 0521 Gross per 24 hour  Intake 1080 ml  Output --  Net 1080 ml   Filed Weights   05/17/24 0957  Weight: 77.1 kg    Scheduled Meds:  acidophilus  1 capsule Oral Daily   amLODipine   5 mg Oral Daily   aspirin   81 mg Oral BID   baclofen   10 mg Oral BID   buprenorphine -naloxone   2 tablet Sublingual Daily   celecoxib   200 mg Oral Daily   citalopram   20 mg Oral QHS   docusate  sodium  100 mg Oral BID   ezetimibe   10 mg Oral QHS   gabapentin   100 mg Oral QHS   lamoTRIgine   100 mg Oral QHS   magnesium  oxide  400 mg Oral BID   pantoprazole   40 mg Oral Daily   polyethylene glycol  17 g Oral Daily   risperiDONE   1 mg Oral QHS   Vitamin D  (Ergocalciferol )  50,000 Units Oral Q Fri   Continuous Infusions:  Nutritional status     Body mass index is 31.09 kg/m.  Data Reviewed:   CBC: Recent Labs  Lab 05/18/24 0317 05/19/24 0318  WBC 15.8* 12.7*  HGB 10.9* 10.8*  HCT 35.3* 34.4*  MCV 86.5 84.9  PLT 242 237   Basic Metabolic Panel: Recent Labs  Lab 05/18/24 0317 05/19/24 0318  NA 139 138  K 4.5 3.8  CL 104 100  CO2 25 27  GLUCOSE 127* 129*  BUN  13 12  CREATININE 0.72 0.67  CALCIUM 9.1 8.8*   GFR: Estimated Creatinine Clearance: 66.5 mL/min (by C-G formula based on SCr of 0.67 mg/dL). Liver Function Tests: No results for input(s): AST, ALT, ALKPHOS, BILITOT, PROT, ALBUMIN in the last 168 hours. No results for input(s): LIPASE, AMYLASE in the last 168 hours. No results for input(s): AMMONIA in the last 168 hours. Coagulation Profile: No results for input(s): INR, PROTIME in the last 168 hours. Cardiac Enzymes: No results for input(s): CKTOTAL, CKMB, CKMBINDEX, TROPONINI in the last 168 hours. BNP (last 3 results) No results for input(s): PROBNP in the last 8760 hours. HbA1C: No results for input(s): HGBA1C in the last 72 hours. CBG: No results for input(s): GLUCAP in the last 168 hours. Lipid Profile: No results for input(s): CHOL, HDL, LDLCALC, TRIG, CHOLHDL, LDLDIRECT in the last 72 hours. Thyroid  Function Tests: No results for input(s): TSH, T4TOTAL, FREET4, T3FREE, THYROIDAB in the last 72 hours. Anemia Panel: No results for input(s): VITAMINB12, FOLATE, FERRITIN, TIBC, IRON, RETICCTPCT in the last 72 hours. Sepsis Labs: No results for input(s): PROCALCITON, LATICACIDVEN in the last 168 hours.  Recent Results (from the past 240 hours)  Surgical pcr screen     Status: None   Collection Time: 05/10/24  3:35 PM   Specimen: Nasal Mucosa; Nasal Swab  Result Value Ref Range Status   MRSA, PCR NEGATIVE NEGATIVE Final   Staphylococcus aureus NEGATIVE NEGATIVE Final    Comment: (NOTE) The Xpert SA Assay (FDA approved for NASAL specimens in patients 23 years of age and older), is one component of a comprehensive surveillance program. It is not intended to diagnose infection nor to guide or monitor treatment. Performed at [facility], [address] [place], [place] [address]          Radiology Studies: No results  found.         LOS: 0 days   Time spent= 35 mins    Burgess JAYSON Dare, MD [facility]  If 7PM-7AM, please contact night-coverage  05/20/2024, 11:07 AM

## 2024-05-21 ENCOUNTER — Other Ambulatory Visit (HOSPITAL_COMMUNITY): Payer: Self-pay

## 2024-05-22 ENCOUNTER — Other Ambulatory Visit: Payer: Self-pay | Admitting: Nurse Practitioner

## 2024-05-23 ENCOUNTER — Telehealth: Payer: Self-pay | Admitting: Physician Assistant

## 2024-05-23 ENCOUNTER — Other Ambulatory Visit (HOSPITAL_COMMUNITY): Payer: Self-pay

## 2024-05-23 NOTE — Telephone Encounter (Signed)
 05/20/2024 05/20/2024 2  Oxycodone -Acetaminophen  10-325 40.00 4 Am Gal [id number] Con (0126) 0/0 150.00 MME Other [place] 05/12/2024 09/14/2023 2  Gabapentin  100 Mg Capsule 90.00 90 [place] [id number] Cen (5313) 2/2  [employer] 05/03/2024 04/26/2024 2  Zubsolv  1.4-0.36 Mg Tablet Sl 90.00 30 [place]

## 2024-05-23 NOTE — Telephone Encounter (Signed)
 Amanda [name] called and said that she had knee replacement and she is in a lot of pain. She would like teresa to give her some xanax  to help with the pain and her anxiety. They gave it to her at the hospital but her doctor said that she would have to get a script from her mental health doctor

## 2024-05-24 ENCOUNTER — Other Ambulatory Visit (HOSPITAL_COMMUNITY): Payer: Self-pay

## 2024-05-24 ENCOUNTER — Other Ambulatory Visit: Payer: Self-pay

## 2024-05-24 DIAGNOSIS — F411 Generalized anxiety disorder: Secondary | ICD-10-CM

## 2024-05-24 MED ORDER — ALPRAZOLAM 0.5 MG PO TABS: 0.5000 mg | ORAL_TABLET | Freq: Every evening | ORAL | 0 refills | Status: DC | PRN

## 2024-05-24 MED ORDER — OXYCODONE-ACETAMINOPHEN 10-325 MG PO TABS
1.0000 | ORAL_TABLET | ORAL | 0 refills | Status: DC | PRN
  Filled 2024-05-24: qty 40, 4d supply, fill #0

## 2024-05-24 NOTE — Telephone Encounter (Signed)
 Pt is asking for 0.5 mg Xanax  at bedtime. She said she can't relax due to pain from knee replacement. Reports was given this in the hospital and it was helpful.    WM in [place]

## 2024-05-24 NOTE — Telephone Encounter (Signed)
 Please pend #30, 1 at bedtime prn, no RF and let pt know.  Thanks

## 2024-05-24 NOTE — Telephone Encounter (Signed)
 Pended Rx and patient notified.

## 2024-05-24 NOTE — Telephone Encounter (Signed)
 Pt lvm that she is still waiting to hear back from teresa about the xanax . She said it helps her relax and sleep. Please call her at [phone]

## 2024-05-26 ENCOUNTER — Other Ambulatory Visit (HOSPITAL_COMMUNITY): Payer: Self-pay

## 2024-05-29 ENCOUNTER — Other Ambulatory Visit (HOSPITAL_COMMUNITY): Payer: Self-pay

## 2024-05-29 ENCOUNTER — Encounter (HOSPITAL_COMMUNITY): Payer: Self-pay | Admitting: Pharmacist

## 2024-05-29 MED ORDER — OXYCODONE-ACETAMINOPHEN 10-325 MG PO TABS
1.0000 | ORAL_TABLET | ORAL | 0 refills | Status: DC | PRN
  Filled 2024-05-29: qty 40, 4d supply, fill #0

## 2024-05-31 ENCOUNTER — Telehealth: Payer: Self-pay

## 2024-05-31 ENCOUNTER — Other Ambulatory Visit (HOSPITAL_COMMUNITY): Payer: Self-pay

## 2024-05-31 NOTE — Telephone Encounter (Signed)
 SABRA

## 2024-06-03 ENCOUNTER — Other Ambulatory Visit (HOSPITAL_COMMUNITY): Payer: Self-pay

## 2024-06-05 ENCOUNTER — Other Ambulatory Visit (HOSPITAL_BASED_OUTPATIENT_CLINIC_OR_DEPARTMENT_OTHER): Payer: Self-pay

## 2024-06-05 ENCOUNTER — Other Ambulatory Visit (HOSPITAL_COMMUNITY): Payer: Self-pay

## 2024-06-05 MED ORDER — OXYCODONE-ACETAMINOPHEN 10-325 MG PO TABS
1.0000 | ORAL_TABLET | ORAL | 0 refills | Status: DC | PRN
  Filled 2024-06-05 (×3): qty 40, 4d supply, fill #0

## 2024-06-12 ENCOUNTER — Other Ambulatory Visit (HOSPITAL_COMMUNITY): Payer: Self-pay

## 2024-06-12 MED ORDER — OXYCODONE-ACETAMINOPHEN 10-325 MG PO TABS
ORAL_TABLET | ORAL | 0 refills | Status: DC
  Filled 2024-06-12: qty 40, 5d supply, fill #0

## 2024-06-15 ENCOUNTER — Encounter: Payer: Self-pay | Admitting: Physician Assistant

## 2024-06-15 ENCOUNTER — Ambulatory Visit: Admitting: Physician Assistant

## 2024-06-15 DIAGNOSIS — F319 Bipolar disorder, unspecified: Secondary | ICD-10-CM | POA: Diagnosis not present

## 2024-06-15 DIAGNOSIS — F411 Generalized anxiety disorder: Secondary | ICD-10-CM

## 2024-06-15 MED ORDER — LAMOTRIGINE 100 MG PO TABS: 100.0000 mg | ORAL_TABLET | Freq: Every day | ORAL | 2 refills | Status: AC

## 2024-06-15 MED ORDER — RISPERIDONE 1 MG PO TABS: 1.0000 mg | ORAL_TABLET | Freq: Every day | ORAL | 2 refills | Status: AC

## 2024-06-15 MED ORDER — ALPRAZOLAM 0.5 MG PO TABS: 0.5000 mg | ORAL_TABLET | Freq: Every evening | ORAL | 0 refills | Status: AC | PRN

## 2024-06-15 MED ORDER — CITALOPRAM HYDROBROMIDE 20 MG PO TABS: 20.0000 mg | ORAL_TABLET | Freq: Every day | ORAL | 2 refills | Status: AC

## 2024-06-15 MED ORDER — GABAPENTIN 100 MG PO CAPS: 100.0000 mg | ORAL_CAPSULE | Freq: Every day | ORAL | 2 refills | Status: AC

## 2024-06-15 NOTE — Progress Notes (Signed)
 "     [facility] Med Check  Patient ID: Amanda [name],  MRN: [id number]  PCP: Amanda Camie BRAVO, NP  Date of Evaluation: 06/15/2024 Time spent:20 minutes  Chief Complaint:  Chief Complaint   Anxiety; Depression; Follow-up    HISTORY/CURRENT STATUS: HPI For routine med check.   Amanda [name] is still doing well on the current medications. She is able to enjoy things.  Energy and motivation are good.  She does not work.  No extreme sadness, tearfulness, or feelings of hopelessness.  Sleeps well most of the time. ADLs and personal hygiene are normal.   Denies any changes in concentration, making decisions, or remembering things.  Appetite has not changed.  Weight is stable.  Anxiety is well-controlled.  No mania, delirium, AH/VH. No SI/HI.  Individual Medical History/ Review of Systems: Changes? :Yes   L knee replacement in Dec.  Past medications for mental health diagnoses include: Depakote, Lamictal , Abilify, Xanax , Celexa , Risperdal , lithium  Allergies: Augmentin [amoxicillin-pot clavulanate], Imitrex [sumatriptan], and Doxycycline hyclate  Current Medications:  Current Outpatient Medications:    albuterol  (VENTOLIN  HFA) 108 (90 Base) MCG/ACT inhaler, Inhale 2 puffs into the lungs every 6 (six) hours as needed for shortness of breath or wheezing., Disp: , Rfl:    amLODipine  (NORVASC ) 5 MG tablet, Take 1 tablet (5 mg total) by mouth daily., Disp: 7 tablet, Rfl: 0   aspirin  EC 81 MG tablet, Take 1 tablet (81 mg total) by mouth 2 (two) times daily after a meal. Day after surgery, Disp: 60 tablet, Rfl: 1   baclofen  (LIORESAL ) 10 MG tablet, Take 10 mg by mouth 2 (two) times daily., Disp: , Rfl:    Buprenorphine  HCl-Naloxone  HCl 1.4-0.36 MG SUBL, Place 1-2 tablets under the tongue See admin instructions. Take 2 tablets by mouth in the morning and take 1 tablet in afternoon, Disp: , Rfl:    docusate sodium  (COLACE) 100 MG capsule, Take 1 capsule (100 mg total) by mouth 2 (two) times daily., Disp: 60  capsule, Rfl: 2   estradiol  (CLIMARA  - DOSED IN MG/24 HR) 0.1 mg/24hr patch, Place 0.1 mg onto the skin daily as needed (excessive sweats)., Disp: , Rfl:    ezetimibe  (ZETIA ) 10 MG tablet, Take 10 mg by mouth at bedtime., Disp: , Rfl:    famotidine  (PEPCID ) 20 MG tablet, Take 20 mg by mouth 2 (two) times daily as needed for indigestion or heartburn., Disp: , Rfl:    furosemide  (LASIX ) 40 MG tablet, Take 1 tablet (40 mg total) by mouth daily as needed for edema., Disp: 90 tablet, Rfl: 1   Galcanezumab -gnlm (EMGALITY ) 120 MG/ML SOAJ, Inject 120 mg into the skin every 30 (thirty) days., Disp: 1 mL, Rfl: 11   magnesium  oxide (MAG-OX) 400 (240 Mg) MG tablet, Take 400 mg by mouth 2 (two) times daily., Disp: , Rfl:    meloxicam (MOBIC) 7.5 MG tablet, Take 7.5 mg by mouth daily., Disp: , Rfl:    ondansetron  (ZOFRAN -ODT) 4 MG disintegrating tablet, Take 4 mg by mouth every 8 (eight) hours as needed for vomiting or nausea., Disp: , Rfl:    oxybutynin  (DITROPAN  XL) 5 MG 24 hr tablet, Take 1 tablet (5 mg total) by mouth at bedtime., Disp: 30 tablet, Rfl: 1   pantoprazole  (PROTONIX ) 40 MG tablet, Take 1 tablet (40 mg total) by mouth daily. For reflux and bloating, Disp: 30 tablet, Rfl: 3   ALPRAZolam  (XANAX ) 0.5 MG tablet, Take 1 tablet (0.5 mg total) by mouth at bedtime as  needed for anxiety., Disp: 30 tablet, Rfl: 0   CELEBREX  200 MG capsule, Take 200 mg by mouth in the morning., Disp: , Rfl:    citalopram  (CELEXA ) 20 MG tablet, Take 1 tablet (20 mg total) by mouth at bedtime., Disp: 90 tablet, Rfl: 2   gabapentin  (NEURONTIN ) 100 MG capsule, Take 1 capsule (100 mg total) by mouth at bedtime., Disp: 90 capsule, Rfl: 2   lamoTRIgine  (LAMICTAL ) 100 MG tablet, Take 1 tablet (100 mg total) by mouth daily., Disp: 90 tablet, Rfl: 2   mometasone  (ELOCON ) 0.1 % cream, Apply to the finger tips twice a day until peeling has stopped. (Patient taking differently: Apply 1 Application topically at bedtime.), Disp: 45 g, Rfl:  1   oxyCODONE -acetaminophen  (PERCOCET) 10-325 MG tablet, Take 1-2 tablets by mouth every 4 (four) hours as needed for severe pain., Disp: 40 tablet, Rfl: 0   polyethylene glycol powder (GLYCOLAX /MIRALAX ) 17 GM/SCOOP powder, Take 17 g by mouth daily. Dissolve 1 capful (17g) in 4-8 ounces of liquid and take by mouth daily., Disp: 238 g, Rfl: 0   risperiDONE  (RISPERDAL ) 1 MG tablet, Take 1 tablet (1 mg total) by mouth at bedtime., Disp: 90 tablet, Rfl: 2   Vitamin D , Ergocalciferol , (DRISDOL ) 1.25 MG (50000 UNIT) CAPS capsule, Take 1 capsule by mouth once a week (Patient taking differently: Take 50,000 Units by mouth every Friday.), Disp: 12 capsule, Rfl: 3 Medication Side Effects: none  Family Medical/ Social History: Changes? No  MENTAL HEALTH EXAM:  There were no vitals taken for this visit.There is no height or weight on file to calculate BMI.  General Appearance: Casual, Neat and Well Groomed  Eye Contact:  Good  Speech:  Clear and Coherent, Normal Rate, and Talkative  Volume:  Normal  Mood:  Euthymic  Affect:  Appropriate  Thought Process:  Goal Directed and Descriptions of Associations: Circumstantial  Orientation:  Full (Time, Place, and Person)  Thought Content: Logical   Suicidal Thoughts:  No  Homicidal Thoughts:  No  Memory:  WNL  Judgement:  Good  Insight:  Good  Psychomotor Activity:  Normal  Concentration:  Concentration: Good and Attention Span: Good  Recall:  Good  Fund of Knowledge: Good  Language: Good  Assets:  Communication Skills Desire for Improvement Financial Resources/Insurance Housing Resilience Transportation  ADL's:  Intact  Cognition: WNL  Prognosis:  Good   PCP follows labs.   DIAGNOSES:    ICD-10-CM   1. Bipolar I disorder (HCC)  F31.9     2. Generalized anxiety disorder  F41.1 ALPRAZolam  (XANAX ) 0.5 MG tablet     Receiving Psychotherapy: No   RECOMMENDATIONS:  PDMP reviewed. Zubsolv  06/05/2024.  Oxycodone  known to me. Xanax   05/24/2024. I provided approximately 20 minutes of face to face time during this encounter, including time spent before and after the visit in records review, medical decision making, counseling pertinent to today's visit, and charting.   [name] doing well so no changes are needed.   Continue Xanax  0.5 mg, 1 at bedtime prn anxiety/sleep. Continue Celexa  20 mg qd. Continue Gabapentin  100 mg qhs. Continue Lamictal  100 mg, 1 qhs. Continue Risperdal  1 mg, 1 qhs.  Return in 9 months.  Verneita Cooks, PA-C  "

## 2024-06-21 ENCOUNTER — Other Ambulatory Visit (HOSPITAL_COMMUNITY): Payer: Self-pay

## 2024-06-21 MED ORDER — OXYCODONE-ACETAMINOPHEN 10-325 MG PO TABS
1.0000 | ORAL_TABLET | ORAL | 0 refills | Status: DC | PRN
  Filled 2024-06-21: qty 40, 4d supply, fill #0

## 2024-06-28 ENCOUNTER — Other Ambulatory Visit (HOSPITAL_COMMUNITY): Payer: Self-pay

## 2024-06-28 ENCOUNTER — Other Ambulatory Visit

## 2024-06-29 ENCOUNTER — Other Ambulatory Visit (HOSPITAL_COMMUNITY): Payer: Self-pay

## 2024-06-29 MED ORDER — OXYCODONE-ACETAMINOPHEN 10-325 MG PO TABS
1.0000 | ORAL_TABLET | ORAL | 0 refills | Status: DC | PRN
  Filled 2024-06-29: qty 40, 4d supply, fill #0

## 2024-07-10 ENCOUNTER — Other Ambulatory Visit (HOSPITAL_COMMUNITY): Payer: Self-pay

## 2024-07-11 ENCOUNTER — Other Ambulatory Visit: Payer: Self-pay

## 2024-07-11 ENCOUNTER — Other Ambulatory Visit (HOSPITAL_COMMUNITY): Payer: Self-pay

## 2024-07-11 DIAGNOSIS — K219 Gastro-esophageal reflux disease without esophagitis: Secondary | ICD-10-CM

## 2024-07-11 MED ORDER — PANTOPRAZOLE SODIUM 40 MG PO TBEC: 40.0000 mg | DELAYED_RELEASE_TABLET | Freq: Every day | ORAL | 3 refills | Status: AC

## 2024-07-11 MED ORDER — OXYCODONE-ACETAMINOPHEN 10-325 MG PO TABS
1.0000 | ORAL_TABLET | Freq: Four times a day (QID) | ORAL | 0 refills | Status: AC | PRN
  Filled 2024-07-11: qty 40, 10d supply, fill #0

## 2024-07-12 ENCOUNTER — Other Ambulatory Visit (HOSPITAL_COMMUNITY): Payer: Self-pay

## 2024-07-12 ENCOUNTER — Other Ambulatory Visit: Payer: Self-pay

## 2024-07-13 ENCOUNTER — Other Ambulatory Visit (HOSPITAL_COMMUNITY): Payer: Self-pay

## 2024-09-12 ENCOUNTER — Ambulatory Visit: Payer: Self-pay

## 2024-11-06 ENCOUNTER — Ambulatory Visit: Admitting: Neurology

## 2025-03-28 ENCOUNTER — Ambulatory Visit: Admitting: Physician Assistant

## 2025-04-17 ENCOUNTER — Encounter: Admitting: Nurse Practitioner
